# Patient Record
Sex: Female | Born: 1940 | Race: White | Hispanic: No | Marital: Married | Smoking: Never smoker
Health system: Southern US, Community
[De-identification: ages and names within clinical notes are randomized; demographics above are authoritative.]

## PROBLEM LIST (undated history)

## (undated) DIAGNOSIS — I493 Ventricular premature depolarization: Secondary | ICD-10-CM

## (undated) DIAGNOSIS — K219 Gastro-esophageal reflux disease without esophagitis: Secondary | ICD-10-CM

## (undated) DIAGNOSIS — F5104 Psychophysiologic insomnia: Secondary | ICD-10-CM

## (undated) DIAGNOSIS — K5790 Diverticulosis of intestine, part unspecified, without perforation or abscess without bleeding: Secondary | ICD-10-CM

## (undated) DIAGNOSIS — F32A Depression, unspecified: Secondary | ICD-10-CM

## (undated) DIAGNOSIS — F329 Major depressive disorder, single episode, unspecified: Secondary | ICD-10-CM

## (undated) DIAGNOSIS — F419 Anxiety disorder, unspecified: Secondary | ICD-10-CM

## (undated) DIAGNOSIS — R002 Palpitations: Secondary | ICD-10-CM

## (undated) DIAGNOSIS — M5136 Other intervertebral disc degeneration, lumbar region: Secondary | ICD-10-CM

## (undated) DIAGNOSIS — E785 Hyperlipidemia, unspecified: Secondary | ICD-10-CM

## (undated) DIAGNOSIS — M8580 Other specified disorders of bone density and structure, unspecified site: Secondary | ICD-10-CM

## (undated) DIAGNOSIS — M1990 Unspecified osteoarthritis, unspecified site: Secondary | ICD-10-CM

## (undated) HISTORY — PX: APPENDECTOMY: SHX54

## (undated) HISTORY — DX: Ventricular premature depolarization: I49.3

## (undated) HISTORY — DX: Other specified disorders of bone density and structure, unspecified site: M85.80

## (undated) HISTORY — PX: OTHER SURGICAL HISTORY: SHX169

## (undated) HISTORY — DX: Major depressive disorder, single episode, unspecified: F32.9

## (undated) HISTORY — DX: Other intervertebral disc degeneration, lumbar region: M51.36

## (undated) HISTORY — DX: Depression, unspecified: F32.A

## (undated) HISTORY — DX: Unspecified osteoarthritis, unspecified site: M19.90

## (undated) HISTORY — DX: Diverticulosis of intestine, part unspecified, without perforation or abscess without bleeding: K57.90

## (undated) HISTORY — DX: Hyperlipidemia, unspecified: E78.5

## (undated) HISTORY — DX: Psychophysiologic insomnia: F51.04

## (undated) HISTORY — PX: TOTAL ABDOMINAL HYSTERECTOMY: SHX209

## (undated) HISTORY — DX: Palpitations: R00.2

## (undated) HISTORY — DX: Anxiety disorder, unspecified: F41.9

---

## 1997-05-30 ENCOUNTER — Encounter: Payer: Self-pay | Admitting: Family Medicine

## 1997-05-30 LAB — CONVERTED CEMR LAB
Blood Glucose, Fasting: 91 mg/dL
WBC, blood: 5.6 10*3/uL

## 1998-12-11 ENCOUNTER — Other Ambulatory Visit: Admission: RE | Admit: 1998-12-11 | Discharge: 1998-12-11 | Payer: Self-pay | Admitting: Gynecology

## 1999-03-12 ENCOUNTER — Encounter: Payer: Self-pay | Admitting: Family Medicine

## 1999-04-26 HISTORY — PX: OTHER SURGICAL HISTORY: SHX169

## 1999-12-11 ENCOUNTER — Other Ambulatory Visit: Admission: RE | Admit: 1999-12-11 | Discharge: 1999-12-11 | Payer: Self-pay | Admitting: Gynecology

## 2000-12-30 ENCOUNTER — Other Ambulatory Visit: Admission: RE | Admit: 2000-12-30 | Discharge: 2000-12-30 | Payer: Self-pay | Admitting: Gynecology

## 2001-05-13 ENCOUNTER — Encounter: Payer: Self-pay | Admitting: Family Medicine

## 2001-05-13 LAB — CONVERTED CEMR LAB
Blood Glucose, Fasting: 43 mg/dL
Hgb A1c MFr Bld: 5.2 %

## 2001-12-27 ENCOUNTER — Other Ambulatory Visit: Admission: RE | Admit: 2001-12-27 | Discharge: 2001-12-27 | Payer: Self-pay | Admitting: Gynecology

## 2002-05-24 ENCOUNTER — Encounter: Payer: Self-pay | Admitting: Family Medicine

## 2002-05-24 LAB — CONVERTED CEMR LAB
Blood Glucose, Fasting: 82 mg/dL
RBC count: 4.66 10*6/uL
TSH: 2.676 microintl units/mL

## 2002-08-22 HISTORY — PX: OTHER SURGICAL HISTORY: SHX169

## 2003-05-02 ENCOUNTER — Other Ambulatory Visit: Admission: RE | Admit: 2003-05-02 | Discharge: 2003-05-02 | Payer: Self-pay | Admitting: Gynecology

## 2003-05-02 ENCOUNTER — Encounter: Payer: Self-pay | Admitting: Critical Care Medicine

## 2003-05-02 ENCOUNTER — Encounter: Admission: RE | Admit: 2003-05-02 | Discharge: 2003-05-02 | Payer: Self-pay | Admitting: Critical Care Medicine

## 2004-05-08 HISTORY — PX: OTHER SURGICAL HISTORY: SHX169

## 2004-08-29 ENCOUNTER — Ambulatory Visit: Payer: Self-pay | Admitting: Family Medicine

## 2005-01-31 ENCOUNTER — Ambulatory Visit: Payer: Self-pay | Admitting: Family Medicine

## 2005-12-26 ENCOUNTER — Ambulatory Visit: Payer: Self-pay | Admitting: Family Medicine

## 2006-05-08 ENCOUNTER — Ambulatory Visit: Payer: Self-pay | Admitting: Internal Medicine

## 2006-05-15 ENCOUNTER — Ambulatory Visit: Payer: Self-pay | Admitting: Family Medicine

## 2006-05-15 ENCOUNTER — Ambulatory Visit: Payer: Self-pay | Admitting: Internal Medicine

## 2006-05-15 ENCOUNTER — Ambulatory Visit: Payer: Self-pay

## 2006-05-15 HISTORY — PX: OTHER SURGICAL HISTORY: SHX169

## 2006-06-11 ENCOUNTER — Ambulatory Visit: Payer: Self-pay | Admitting: Internal Medicine

## 2006-08-06 ENCOUNTER — Ambulatory Visit: Payer: Self-pay | Admitting: Internal Medicine

## 2006-12-17 ENCOUNTER — Ambulatory Visit: Payer: Self-pay | Admitting: Family Medicine

## 2007-03-15 ENCOUNTER — Ambulatory Visit: Payer: Self-pay | Admitting: Internal Medicine

## 2007-05-17 ENCOUNTER — Ambulatory Visit: Payer: Self-pay | Admitting: Family Medicine

## 2007-05-25 ENCOUNTER — Encounter: Payer: Self-pay | Admitting: Family Medicine

## 2007-05-25 LAB — CONVERTED CEMR LAB: RBC count: 4.66 10*6/uL

## 2007-05-27 ENCOUNTER — Telehealth: Payer: Self-pay | Admitting: Family Medicine

## 2007-06-17 ENCOUNTER — Telehealth (INDEPENDENT_AMBULATORY_CARE_PROVIDER_SITE_OTHER): Payer: Self-pay | Admitting: *Deleted

## 2008-01-11 ENCOUNTER — Ambulatory Visit: Payer: Self-pay | Admitting: Family Medicine

## 2008-01-11 DIAGNOSIS — M949 Disorder of cartilage, unspecified: Secondary | ICD-10-CM

## 2008-01-11 DIAGNOSIS — J209 Acute bronchitis, unspecified: Secondary | ICD-10-CM

## 2008-01-11 DIAGNOSIS — M899 Disorder of bone, unspecified: Secondary | ICD-10-CM | POA: Insufficient documentation

## 2008-01-11 DIAGNOSIS — G4700 Insomnia, unspecified: Secondary | ICD-10-CM

## 2008-01-17 ENCOUNTER — Ambulatory Visit: Payer: Self-pay | Admitting: Internal Medicine

## 2008-01-18 ENCOUNTER — Encounter: Payer: Self-pay | Admitting: Family Medicine

## 2008-01-18 DIAGNOSIS — E785 Hyperlipidemia, unspecified: Secondary | ICD-10-CM | POA: Insufficient documentation

## 2008-01-18 DIAGNOSIS — R002 Palpitations: Secondary | ICD-10-CM | POA: Insufficient documentation

## 2008-03-28 ENCOUNTER — Ambulatory Visit: Payer: Self-pay | Admitting: Family Medicine

## 2008-03-30 ENCOUNTER — Encounter (INDEPENDENT_AMBULATORY_CARE_PROVIDER_SITE_OTHER): Payer: Self-pay | Admitting: *Deleted

## 2008-06-21 ENCOUNTER — Ambulatory Visit: Payer: Self-pay | Admitting: Family Medicine

## 2008-06-27 ENCOUNTER — Telehealth: Payer: Self-pay | Admitting: Family Medicine

## 2008-07-24 ENCOUNTER — Ambulatory Visit: Payer: Self-pay | Admitting: Physician Assistant

## 2008-07-25 ENCOUNTER — Ambulatory Visit: Payer: Self-pay | Admitting: Family Medicine

## 2008-07-31 LAB — CONVERTED CEMR LAB: Vit D, 1,25-Dihydroxy: 27 — ABNORMAL LOW (ref 30–89)

## 2008-08-03 ENCOUNTER — Telehealth: Payer: Self-pay | Admitting: Family Medicine

## 2008-09-13 ENCOUNTER — Ambulatory Visit: Payer: Self-pay | Admitting: Unknown Physician Specialty

## 2008-09-19 ENCOUNTER — Ambulatory Visit: Payer: Self-pay | Admitting: Internal Medicine

## 2008-09-25 ENCOUNTER — Ambulatory Visit: Payer: Self-pay | Admitting: Unknown Physician Specialty

## 2008-09-25 HISTORY — PX: OTHER SURGICAL HISTORY: SHX169

## 2009-02-09 DIAGNOSIS — M1990 Unspecified osteoarthritis, unspecified site: Secondary | ICD-10-CM | POA: Insufficient documentation

## 2009-02-09 DIAGNOSIS — F341 Dysthymic disorder: Secondary | ICD-10-CM | POA: Insufficient documentation

## 2009-02-12 ENCOUNTER — Telehealth (INDEPENDENT_AMBULATORY_CARE_PROVIDER_SITE_OTHER): Payer: Self-pay | Admitting: *Deleted

## 2009-04-13 ENCOUNTER — Ambulatory Visit: Payer: Self-pay | Admitting: Family Medicine

## 2009-04-13 LAB — CONVERTED CEMR LAB
ALT: 13 units/L (ref 0–35)
Albumin: 3.9 g/dL (ref 3.5–5.2)
Basophils Relative: 0 % (ref 0.0–3.0)
CO2: 27 meq/L (ref 19–32)
Calcium: 9.5 mg/dL (ref 8.4–10.5)
Cholesterol: 221 mg/dL — ABNORMAL HIGH (ref 0–200)
Creatinine, Ser: 0.7 mg/dL (ref 0.4–1.2)
Direct LDL: 155 mg/dL
Eosinophils Relative: 2.4 % (ref 0.0–5.0)
Glucose, Bld: 98 mg/dL (ref 70–99)
Hemoglobin: 13.4 g/dL (ref 12.0–15.0)
Lymphs Abs: 1.8 10*3/uL (ref 0.7–4.0)
MCV: 86.5 fL (ref 78.0–100.0)
Monocytes Absolute: 0.4 10*3/uL (ref 0.1–1.0)
Neutro Abs: 3 10*3/uL (ref 1.4–7.7)
Platelets: 288 10*3/uL (ref 150.0–400.0)
RBC: 4.6 M/uL (ref 3.87–5.11)
Total CHOL/HDL Ratio: 6
Total Protein: 7.1 g/dL (ref 6.0–8.3)
Triglycerides: 137 mg/dL (ref 0.0–149.0)

## 2009-04-16 LAB — CONVERTED CEMR LAB: Vit D, 25-Hydroxy: 18 ng/mL — ABNORMAL LOW (ref 30–89)

## 2009-04-17 ENCOUNTER — Ambulatory Visit: Payer: Self-pay | Admitting: Family Medicine

## 2009-04-17 DIAGNOSIS — J45909 Unspecified asthma, uncomplicated: Secondary | ICD-10-CM | POA: Insufficient documentation

## 2009-04-17 DIAGNOSIS — I4949 Other premature depolarization: Secondary | ICD-10-CM

## 2009-05-03 ENCOUNTER — Ambulatory Visit: Payer: Self-pay | Admitting: Family Medicine

## 2009-05-03 ENCOUNTER — Encounter (INDEPENDENT_AMBULATORY_CARE_PROVIDER_SITE_OTHER): Payer: Self-pay | Admitting: *Deleted

## 2009-05-03 LAB — FECAL OCCULT BLOOD, GUAIAC: Fecal Occult Blood: NEGATIVE

## 2009-05-03 LAB — CONVERTED CEMR LAB: OCCULT 1: NEGATIVE

## 2009-05-30 ENCOUNTER — Ambulatory Visit: Payer: Self-pay

## 2009-06-21 ENCOUNTER — Ambulatory Visit: Payer: Self-pay | Admitting: Unknown Physician Specialty

## 2009-06-26 ENCOUNTER — Ambulatory Visit: Payer: Self-pay | Admitting: Unknown Physician Specialty

## 2009-06-26 HISTORY — PX: OTHER SURGICAL HISTORY: SHX169

## 2009-08-13 ENCOUNTER — Telehealth: Payer: Self-pay | Admitting: Family Medicine

## 2009-08-14 ENCOUNTER — Ambulatory Visit: Payer: Self-pay | Admitting: Family Medicine

## 2010-02-05 ENCOUNTER — Telehealth: Payer: Self-pay | Admitting: Family Medicine

## 2010-04-09 ENCOUNTER — Encounter (INDEPENDENT_AMBULATORY_CARE_PROVIDER_SITE_OTHER): Payer: Self-pay | Admitting: *Deleted

## 2010-06-27 ENCOUNTER — Ambulatory Visit: Payer: Self-pay | Admitting: Family Medicine

## 2010-06-29 LAB — CONVERTED CEMR LAB
Albumin: 4.2 g/dL (ref 3.5–5.2)
Alkaline Phosphatase: 76 units/L (ref 39–117)
BUN: 16 mg/dL (ref 6–23)
Basophils Absolute: 0 10*3/uL (ref 0.0–0.1)
Basophils Relative: 0.6 % (ref 0.0–3.0)
CO2: 26 meq/L (ref 19–32)
Calcium: 9.2 mg/dL (ref 8.4–10.5)
Cholesterol: 242 mg/dL — ABNORMAL HIGH (ref 0–200)
Creatinine, Ser: 0.6 mg/dL (ref 0.4–1.2)
Direct LDL: 158.8 mg/dL
Eosinophils Absolute: 0.1 10*3/uL (ref 0.0–0.7)
GFR calc non Af Amer: 102.05 mL/min (ref >60)
Glucose, Bld: 94 mg/dL (ref 70–99)
HDL: 45.7 mg/dL (ref >39.00)
Lymphocytes Relative: 41.1 % (ref 12.0–46.0)
MCHC: 34 g/dL (ref 30.0–36.0)
MCV: 88.9 fL (ref 78.0–100.0)
Monocytes Absolute: 0.4 10*3/uL (ref 0.1–1.0)
Neutrophils Relative %: 49.6 % (ref 43.0–77.0)
RBC: 4.62 M/uL (ref 3.87–5.11)
RDW: 13.9 % (ref 11.5–14.6)
Total Protein: 7.3 g/dL (ref 6.0–8.3)
Triglycerides: 179 mg/dL — ABNORMAL HIGH (ref 0.0–149.0)
VLDL: 35.8 mg/dL (ref 0.0–40.0)

## 2010-07-03 ENCOUNTER — Ambulatory Visit: Payer: Self-pay | Admitting: Family Medicine

## 2010-08-05 ENCOUNTER — Telehealth: Payer: Self-pay | Admitting: Family Medicine

## 2010-10-03 NOTE — Progress Notes (Signed)
Summary: Kelly [name]  Phone Note Refill Request Message from:  Fax from Pharmacy on August 05, 2010 10:07 AM  Refills Requested: Medication #1:  AMBIEN 10 MG  TABS 1 BY MOUTH AT BEDTIME AS NEEDED   Last Refilled: 07/05/2010 [facility] [phone]   Initial call taken by: Melody Comas,  August 05, 2010 10:07 AM  Follow-up for Phone Call        rx called to pharmacy.  Follow-up by: Melody Comas,  August 05, 2010 3:42 PM    Prescriptions: AMBIEN 10 MG  TABS (ZOLPIDEM TARTRATE) 1 BY MOUTH AT BEDTIME AS NEEDED  #30 x 5   Entered and Authorized by:   Shaune Leeks MD   Signed by:   Shaune Leeks MD on 08/05/2010   Method used:   Telephoned to ...       [facility]ail[facility]       [address]       [place], [place]  [address]       Ph: [phone]       Fax: [phone]   RxID:   [id number]

## 2010-10-03 NOTE — Assessment & Plan Note (Signed)
Summary: CPX,FLU SHOT/CLE   Vital Signs:  Patient profile:   70 year old female Height:      65.5 inches Temp:     97.8 degrees F oral Pulse rate:   84 / minute Pulse rhythm:   regular BP sitting:   130 / 80  (left arm) Cuff size:   large  Vitals Entered By: Sydell Axon LPN (July 03, 2010 2:53 PM) CC: Patient declined to weigh, 30 minute checkup, goes to [facility] for GYN care, has had a hysterectomy, had a Mammogram 2 years ago, C-V Risk Management   History of Present Illness: Pt here for Comp Exam. She leaves soon for [place] for 28 day cruise of the [place]. Starts in [place] with a few days there before boarding. She is still taking Ambien but 1/2 lots of times. If she doesn't take it she wakes up during the night, often multiple times. 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  Continues to have knee pain, had LTKR 1/10 and then RTKR later that year and she has pain with active weight bearing...getting up and down. She has used Voltaren cream which helps sometimes.  Palpitations are episodic and no breathing problems since Jan. Sees Dr Doug Sou.  Cardiovascular Risk History:      Positive major cardiovascular risk factors include female age 21 years old or older and hyperlipidemia.  Negative major cardiovascular risk factors include non-tobacco-user status.    Preventive Screening-Counseling & Management  Alcohol-Tobacco     Alcohol drinks/day: 0     Smoking Status: never     Passive Smoke Exposure: no  Caffeine-Diet-Exercise     Caffeine use/day: 0     Does Patient Exercise: no     Type of exercise: walks daily, rides bikes.     Exercise (avg: min/session): <30     Times/week: 6  Problems Prior to Update: 1)  Special Screening Malig Neoplasms Other Sites  (ICD-V76.49) 2)  Premature Ventricular Contractions  (ICD-427.69) 3)  Palpitations, Occasional  (ICD-785.1) 4)  Hyperlipidemia  (ICD-272.4) 5)  Asthma  (ICD-493.90) 6)   Osteoarthritis  (ICD-715.90) 7)  Anxiety Depression  (ICD-300.4) 8)  Insomnia, Chronic  (ICD-307.42) 9)  Bronchitis, Acute With Bronchospasm  (ICD-466.0) 10)  Osteoarthrosis Unspec Whether Gen/localized Hand  (ICD-715.94) 11)  Osteopenia  (ICD-733.90)  Medications Prior to Update: 1)  Ambien 10 Mg  Tabs (Zolpidem Tartrate) .Marland Kitchen.. 1 By Mouth At Bedtime As Needed 2)  Bayer Low Strength 81 Mg  Tbec (Aspirin) .Marland Kitchen.. 1 Daily By Mouth 3)  Vitamin D 28413 Unit  Caps (Ergocalciferol) .... One Tab Every Other Week For Two Months. 4)  Propranolol Hcl 10 Mg  Tabs (Propranolol Hcl) .Marland Kitchen.. 1-2 Tabs By Mouth Every 6 Hrs As Needed Palpitations 5)  Prilosec Otc 20 Mg Tbec (Omeprazole Magnesium) .Marland Kitchen.. 1 Daily By Mouth 6)  Nasonex 50 Mcg/act Susp (Mometasone Furoate) .Marland Kitchen.. 1 Spray Each Nostril Daily 7)  Pulmicort Flexhaler 180 Mcg/act Aepb (Budesonide) .Marland Kitchen.. 1 Inhalation Daily 8)  Ventolin Hfa 108 (90 Base) Mcg/act Aers (Albuterol Sulfate) .... As Needed 9)  Aleve 220 Mg Caps (Naproxen Sodium) .... As Needed For Pain  Allergies: 1)  ! Amoxicillin 2)  ! Biaxin  Past History:  Past Medical History: Last updated: 02/09/2009 1. PREMATURE VENTRICULAR CONTRACTIONS   2. PALPITATIONS, OCCASIONAL   3. HYPERLIPIDEMIA  4. ASTHMA  5. OSTEOARTHRITIS 6. ANXIETY DEPRESSION  7. INSOMNIA, CHRONIC  8. BRONCHITIS, ACUTE WITH BRONCHOSPASM  9.  OSTEOARTHROSIS UNSPEC WHETHER GEN/LOCALIZED HAND  10. SINUSITIS, ACUTE MAXILLARY   11. OSTEOPENIA    Family History: Last updated: 07/03/2010 Father dec 86 Parkinson's sequelae Mother dec 90 MI Brother dec 1 yr Mennningitis Brother dec Hit by drunk driver at [employer] Sister A 72 Osteoporosis, Arthritis. 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  Alcohol Use - no Regular Exercise - no Drug Use - no  Risk Factors: Alcohol Use: 0 (07/03/2010) Caffeine Use: 0 (07/03/2010) Exercise: no (07/03/2010)  Risk Factors: Smoking Status: never (07/03/2010) Passive Smoke Exposure: no (07/03/2010)  Past Surgical History: NSVD X 2 BTL  TAH DUE TO ENDOMETRIOSIS W/ ENDOMETRIOMA APPENDECTOMY STRESS CARDIOLITE NORMAL EF 76%:(04/26/1999) DEXA OSTEOPENIA FEM NECK, SPINE:(08/22/2002) ABD/ ULTRA SOUND WITHIN NORMAL LIMITS:907/2005) STRESS MYOVIEW -- NORMAL:(05/15/2006) L Knee Arthroscopy (Dr Raford Pitcher) (09/25/2008) R Knee Arthroscopy Lots of Arthritis (Dr Gerrit Heck) (06/26/2009)  Family History: Father dec 86 Parkinson's sequelae Mother dec 90 MI Brother dec 1 yr Mennningitis Brother dec Hit by drunk driver at [employer] Sister A 72 Osteoporosis, Arthritis. Resp Dz CV: + MOTHER AORTIC STTENOSIS ; PACER HBP: NEGATIVE DM: + THROUGHOUT PATERNAL SIDE GOUT/ARTHRITIS:  PROSTATE CANCER: BREAST CANCER: + AUNT //FIRST COUSIN DIEC FROM CANCER OVARIAN/UTERINE CANCER: NEGATIVE COLON CANCER: DEPRESSION: NEGATIVE ETOH/DRUG ABUSE: NEGATIVE OTHER: STROKE NEGATIVE  Review of Systems General:  Denies chills, fatigue, fever, sweats, weakness, and weight loss; has night sweats of estrogen depr. Eyes:  Denies blurring, discharge, and eye pain. ENT:  Denies decreased hearing, earache, and ringing in ears. CV:  Complains of palpitations; denies chest pain or discomfort, fainting, fatigue, and shortness of breath with exertion. Resp:  Denies cough, shortness of breath, and wheezing. GI:  Denies abdominal pain, bloody stools, change in bowel habits, constipation, dark tarry stools, diarrhea,  indigestion, loss of appetite, nausea, vomiting, vomiting blood, and yellowish skin color. GU:  Denies discharge, dysuria, nocturia, and urinary frequency; nocturia once. Derm:  Denies dryness, itching, and rash; sees Derm yearly.. Neuro:  Denies numbness, poor balance, tingling, and tremors.  Physical Exam  General:  Well-developed,well-nourished,in no acute distress; alert,appropriate and cooperative throughout examination, looks good, very comfortable. Head:  Normocephalic and atraumatic without obvious abnormalities. No apparent alopecia or balding. Sinuses nontender. Eyes:  Conjunctiva clear bilaterally. Ears:  External ear exam shows no significant lesions or deformities.  Otoscopic examination reveals clear canals, tympanic membranes are intact bilaterally without bulging, retraction, inflammation or discharge. Hearing is grossly normal bilaterally. Nose:  External nasal examination shows no deformity or inflammation. Nasal mucosa are pink and moist without lesions or exudates. Mouth:  Oral mucosa and oropharynx without lesions or exudates.  Teeth in good repair. Neck:  No deformities, masses, or tenderness noted. Chest Wall:  No deformities, masses, or tenderness noted. Breasts:  not done Lungs:  Normal respiratory effort, chest expands symmetrically. Lungs are clear to auscultation, no crackles or wheezes. Very  comfortable today. Heart:  Normal rate and regular rhythm. S1 and S2 normal without gallop, murmur, click, rub or other extra sounds. Abdomen:  Bowel sounds positive,abdomen soft and non-tender without masses, organomegaly or hernias noted. Rectal:  not done Genitalia:  not done Msk:  No deformity or scoliosis noted of thoracic or lumbar spine.   Pulses:  R and L carotid,radial,femoral,dorsalis pedis and posterior tibial pulses are full and equal bilaterally Extremities:  Hands with mild angulation and nodules of the fingers. Discomfort mainly distally. ROM  decreased. Neurologic:  No cranial nerve deficits noted. Station and gait are normal. Sensory, motor and coordinative functions appear intact. Skin:  Intact without suspicious lesions or rashes. Has sclerotic type lesion in the middle of her forehead which she relates changed slightly in the last few months. Cervical Nodes:  No lymphadenopathy noted Inguinal Nodes:  No significant adenopathy Psych:  Cognition and judgment appear intact. Alert and cooperative with normal attention span and concentration. No apparent delusions, illusions, hallucinations   Impression & Recommendations:  Problem # 1:  HEALTH MAINTENANCE EXAM (ICD-V70.0) Assessment Comment Only Flu shot today. Td UTD.  Problem # 2:  PREMATURE VENTRICULAR CONTRACTIONS (ICD-427.69) Assessment: Unchanged  Stable. Cont medications as is. The following medications were removed from the medication list:    Propranolol Hcl 10 Mg Tabs (Propranolol hcl) .Marland Kitchen... 1-2 tabs by mouth every 6 hrs as needed palpitations Her updated medication list for this problem includes:    Bayer Low Strength 81 Mg Tbec (Aspirin) .Marland Kitchen... 1 daily by mouth  Echocardiogram: Normal M-mode, 2-D, color Doppler echocardiogram SBE prophylaxis is not required (05/06/2002)  Labs Reviewed: Na: 137 (06/27/2010)   K+: 4.4 (06/27/2010)   CL: 101 (06/27/2010)   HCO3: 26 (06/27/2010) Mg: 2.2 (04/13/2009)   Ca: 9.2 (06/27/2010)   TSH: 2.44 (06/27/2010)   HCO3: 26 (06/27/2010)  Problem # 3:  HYPERLIPIDEMIA (ICD-272.4) LDL 158...tyoo high. Discussed diet and exercise. Is exercising as much as able with current knee situation. Diet must be carefully monitored. Labs Reviewed: SGOT: 18 (06/27/2010)   SGPT: 13 (06/27/2010)  10 Yr Risk Heart Disease: Not enough information   HDL:45.70 (06/27/2010), 39.80 (04/13/2009)  Chol:242 (06/27/2010), 221 (04/13/2009)  Trig:179.0 (06/27/2010), 137.0 (04/13/2009)  Problem # 4:  OSTEOARTHRITIS (ICD-715.90) Assessment:  Deteriorated Her nodes look more active....presumed inflammatory. Suggest trying herb we discussed. Give it one month trial. Hopefully will help hands and some of her knee pain. May also suggest Hyaluronic acid. Her updated medication list for this problem includes:    Bayer Low Strength 81 Mg Tbec (Aspirin) .Marland Kitchen... 1 daily by mouth    Aleve 220 Mg Caps (Naproxen sodium) .Marland Kitchen... As needed for pain  Problem # 5:  INSOMNIA, CHRONIC (ICD-307.42) Assessment: Unchanged Stable on Ambien but realizes she could not stop it.  Complete Medication List: 1)  Ambien 10 Mg Tabs (Zolpidem tartrate) .Marland Kitchen.. 1 by mouth at bedtime as needed 2)  Bayer Low Strength 81 Mg Tbec (Aspirin) .Marland Kitchen.. 1 daily by mouth 3)  Prilosec Otc 20 Mg Tbec (Omeprazole magnesium) .Marland Kitchen.. 1 daily by mouth 4)  Nasonex 50 Mcg/act Susp (Mometasone furoate) .Marland Kitchen.. 1 spray each nostril daily as needed 5)  Pulmicort Flexhaler 180 Mcg/act Aepb (Budesonide) .Marland Kitchen.. 1 inhalation daily as needed 6)  Ventolin Hfa 108 (90 Base) Mcg/act Aers (Albuterol sulfate) .... As needed 7)  Aleve 220 Mg Caps (Naproxen sodium) .... As needed for pain  Other Orders: Admin 1st Vaccine (04540) Flu Vaccine 61yrs + (98119)  Cardiovascular Risk Assessment/Plan:  The patient's hypertensive risk group is category B: At least one risk factor (excluding diabetes) with no target organ damage.  Today's blood pressure is 130/80.     Patient Instructions: 1)  RTC one month for discussion of arthritis or call if needs rereferral to Dr Gavin Potters. Prescriptions: AMBIEN 10 MG  TABS (ZOLPIDEM TARTRATE) 1 BY MOUTH AT BEDTIME AS NEEDED  #30 x 5   Entered and Authorized by:   Shaune Leeks MD   Signed by:   Shaune Leeks MD on 07/03/2010   Method used:   Print then Give to Patient   RxID:   [id number]    Orders Added: 1)  Admin 1st Vaccine [90471] 2)  Flu Vaccine 51yrs + [14782] 3)  Est. Patient 65& > [95621]    Current Allergies (reviewed today): !  AMOXICILLIN ! BIAXIN   Flu Vaccine Consent Questions     Do you have a history of severe allergic reactions to this vaccine? no    Any prior history of allergic reactions to egg and/or gelatin? no    Do you have a sensitivity to the preservative Thimersol? no    Do you have a past history of Guillan-Barre Syndrome? no    Do you currently have an acute febrile illness? no    Have you ever had a severe reaction to latex? no    Vaccine information given and explained to patient? yes    Are you currently pregnant? no    Lot Number:[id number]   Exp Date:03/01/2011   Site Given  Left Deltoid [email or web address]

## 2010-10-03 NOTE — Progress Notes (Signed)
Summary: refill request for ambien  Phone Note Refill Request Message from:  Fax from Pharmacy  Refills Requested: Medication #1:  AMBIEN 10 MG  TABS 1 BY MOUTH AT BEDTIME AS NEEDED   Last Refilled: 01/09/2010 [facility] [place], [phone].  Initial call taken by: Lowella Petties CMA,  February 05, 2010 10:27 AM  Follow-up for Phone Call        [facility] Follow-up by: Lowella Petties CMA,  February 05, 2010 12:58 PM    Prescriptions: AMBIEN 10 MG  TABS (ZOLPIDEM TARTRATE) 1 BY MOUTH AT BEDTIME AS NEEDED  #30 x 5   Entered and Authorized by:   Shaune Leeks MD   Signed by:   Shaune Leeks MD on 02/05/2010   Method used:   Telephoned to ...       [facility] (retail)       [address]       [place], [place]  [address]       Ph: [phone]       Fax: [phone]   RxID:   [id number]

## 2010-10-03 NOTE — Letter (Signed)
Summary: Nadara Eaton letter  [place] [facility]  [address] [place], [place] [address]   Phone: [phone]  Fax: [phone]       04/09/2010 MRN: [id number]  [facility] [address] [place], [place]  [address]  Dear Ms. [facility],  [facility] [place], and [facility] announce the retirement of Arta Silence, M.D., from full-time practice at the [facility] office effective February 28, 2010 and his plans of returning part-time.  It is important to Dr. Hetty Ely and to our practice that you understand that [facility] has seven physicians in our office for your health care needs.  We will continue to offer the same exceptional care that you have today.    Dr. Hetty Ely has spoken to many of you about his plans for retirement and returning part-time in the fall.   We will continue to work with you through the transition to schedule appointments for you in the office and meet the high standards that [facility] is committed to.   Again, it is with great pleasure that we share the news that Dr. Hetty Ely will return to [facility] in October of 2011 with a reduced schedule.    If you have any questions, or would like to request an appointment with one of our physicians, please call us at [phone] and press the option for Scheduling an appointment.  We take pleasure in providing you with excellent patient care and look forward to seeing you at your next office visit.  Our [facility] Physicians are:  Tillman Abide, M.D. Laurita Quint, M.D. Roxy Manns, M.D. Kerby Nora, M.D. Hannah Beat, M.D. Ruthe Mannan, M.D. We proudly welcomed Raechel Ache, M.D. and Eustaquio Boyden, M.D. to the practice in July/August 2011.  Sincerely,   [facility]

## 2011-01-14 NOTE — Assessment & Plan Note (Signed)
[facility] OFFICE NOTE   NAME:Kelly [name], Kelly [name]                    MRN:          [id number]  DATE:09/19/2008                            DOB:          1941/02/28    REFERRING PHYSICIAN:  Dr. Gerrit Heck  in Orthopedics.   PRIMARY CARE PHYSICIAN:  Arta Silence, MD.   REASON FOR CONSULTATION:  Preoperative risk stratification in the  setting of an abnormal EKG.   HISTORY OF PRESENT ILLNESS:  Kelly [name] is a very pleasant 70 year old  woman who is followed by Dr. Graciela Husbands for history of symptomatic PACs and  PVCs.  She denies any known history of coronary artery disease.  She did  have a stress Myoview back in 2007 which showed normal LV function and  normal perfusion with no evidence of ischemia or infarct.  She also  carries a history of asthma and arthritis.   She is having problems with left knee pain and is currently being worked  up for arthroscopic surgery with Dr. Gerrit Heck .  At her preoperative visit  with anesthesia she was told she had an abnormal EKG and was sent here  for further risk stratification.   REVIEW OF SYSTEMS:  She denies any chest pain or dyspnea.  She has not  had orthopnea or PND.  She continues to have symptomatic palpitations.  She has failed multiple antiarrhythmics.  Review of systems is notable  for her arthritis pain as well as anxiety and depression.  Otherwise all  systems are negative.   PAST MEDICAL HISTORY:  1. Symptomatic PACs and PVCs.      a.     Normal echo and also normal Myoview within the last few       years.  2. Hypertension, resolved with weight loss.  3. Asthma.  4. Osteoarthritis.  5. History of anxiety and depression.   CURRENT MEDICATIONS:  1. Vitamin D.  2. Aspirin 81.  3. Claritin.  4. Ambien 10 mg at night.  5. Prilosec.  6. Nasonex.  7. Pulmicort.  8. Lodine, which is on hold.   ALLERGIES:  She has no known drug allergies.   SOCIAL HISTORY:   She is married.  She recently retired.  She has two  children.  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  She does not smoke cigarettes or drink alcohol.   FAMILY HISTORY:  Mother died at 78 from a heart attack. Father died at  51 from Parkinson's disease.  There is no family history of premature  coronary artery disease.   PHYSICAL EXAM:  She is well-appearing, in no acute distress.  She  ambulates around the clinic without any respiratory difficulty.  Blood  pressure is 118/72, heart rate is 102.  HEENT:  Is normal.  NECK:  Is supple.  No JVD.  Carotids are 2+ bilaterally without any  bruits.  There is no lymphadenopathy or thyromegaly.  CARDIAC:  PMI is nondisplaced.  Regular rate and rhythm with 2/6  systolic ejection murmur at the left sternal border.  No rub or gallop.  LUNGS:  Clear.  ABDOMEN:  Soft, nontender, nondistended.  No hepatosplenomegaly.  No  bruits.  No masses.  Good bowel sounds.  EXTREMITIES:  Warm with no cyanosis, clubbing or edema.  No rash.  NEURO:  Alert and oriented x3.  Cranial nerves II-XII are intact.  Moves  all fours without difficulty.  Affect is pleasant.   I have two EKGs to review.  One is from September 13, 2008.  It shows  normal sinus rhythm at a rate of 76.  There is poor R-wave progression  and the computer reads this as cannot rule out previous anterior  infarct.  There are no ST-T wave abnormalities.  EKG from today shows  sinus tachycardia at a rate of 102 with occasional PACs.  There is  minimal ST scooping throughout.  This is nondiagnostic.  The R-wave  progression here is normal.   ASSESSMENT/PLAN:  In reviewing Ms. Kouns's EKG her poor R-wave  progression seems to be just due to differential lead placement.  There  does not seem to be any significant abnormality here.  She does have  minimal nonspecific ST scooping on the EKG today, but this is not  diagnostic in any way.  She has had a fairly recent  Myoview with  preserved exercise tolerance.  I do not think she warrants any further  cardiac workup prior to her surgery.  I feel she is low risk for  perioperative cardiac complications.  I explained this to her.  Please  do not hesitate to call us if there are any problems in the  postoperative period.  She will continue to follow with Dr. Graciela Husbands.     Bevelyn Buckles. Bensimhon, MD  Electronically Signed    DRB/MedQ  DD: 09/19/2008  DT: 09/19/2008  Job #: [id number]   cc:   Duke Salvia, MD, [facility]  Ruthann Cancer, MD

## 2011-01-14 NOTE — Progress Notes (Signed)
[facility] OFFICE NOTE   NAME:[name], Kelly GERBINO                    MRN:          [id number]  DATE:03/15/2007                            DOB:          01-Dec-1940    Kelly [name] is seen in followup for palpitations related to documented  PACs and PVCs.  She had been tried on a variety of 1-C anti-arrythmic  drugs without success.  I last time gave her a prescription for short-  acting Verapamil.  This also has no significant impact.   She is currently doing well, taking nothing.   EXAMINATION:  VITAL SIGNS:  Her blood pressure is 132/80, her pulse is  81.  LUNGS:  Clear.  HEART:  Sounds were regular.  EXTREMITIES:  Without edema.   IMPRESSION:  1. Symptomatic premature atrial contractions and premature ventricular      contractions.  2. Dyslipidemia.   Kelly [name] is stable.  We will see her again in 9 months' time.     Duke Salvia, MD, [facility]  Electronically Signed    SCK/MedQ  DD: 03/15/2007  DT: 03/16/2007  Job #: [id number]

## 2011-01-14 NOTE — Letter (Signed)
Jan 17, 2008    Arta Silence, MD  [address] [place], [place] [address]   RE:  SPENCER, PETERKIN  MRN:  [id number]  /  DOB:  [birth year]   Dear Nadine Counts:   I hope you are well.  Kimori Tartaglia comes in today after some hiatus  because of persistent problems with symptomatic palpitations.  She  describes skips in her heartbeat. She describes her monitor recording  rates at 120 beats per minute. Looking back at the Holter monitor from  the year 2000, she indeed had resting average rates in the high 90s to  low 100s and sometimes as high as 120, and at that time she had about  4700 PVCs in a 24-hour period.  It is her impression that her  palpitations are worse.  She thinks they are aggravated by meals as well  as stress.   Recalling that she had been on a variety beta blockers prior to my  seeing her, has failed flecainide, and we had tried her on p.r.n.  verapamil without benefit, we discussed again treatment options.   Her current medications include aspirin and vitamin D which was just  started. I presume from her report that her hemoglobin and her TSH were  normal.   PHYSICAL EXAMINATION:  VITAL SIGNS:  Her blood pressure waas148/82.  Her  pulse was 94.  LUNGS:  Clear.  CARDIAC:  Her heart sounds were regular with only an early murmur.  ABDOMEN:  Soft.  EXTREMITIES:  Were without edema.   Electrocardiogram dated today demonstrated sinus rhythm at 82 with  intervals of 0.18/0.07/0.38. The axis was 75 degrees.   IMPRESSION:  1. Symptomatic palpitations, likely PACs based on the Holter report      from years ago.  2. Known infrequent PVCs.   Mrs. Kiesling, Nadine Counts, has significantly symptomatic palpitations.  We  discussed treatment options including p.r.n. beta blockers, a 1C agent,  specifically propafenone, and/or consideration for catheter ablation.  We discussed potential proarrhythmic risks of the second and the  potential procedure risks of the latter, and  she would like to pursue  the first with its minimal side effects.   To that, we will give her prescription for Inderal 10 mg to take one to  two as needed every 6 hours.  I will plan to sit down with her in about  6 weeks' time to review this.    Sincerely,      Duke Salvia, MD, [facility]  Electronically Signed    SCK/MedQ  DD: 01/17/2008  DT: 01/17/2008  Job #: [id number]

## 2011-01-17 NOTE — Assessment & Plan Note (Signed)
[facility]                           ELECTROPHYSIOLOGY OFFICE NOTE   NAME:Kelly [name], Kelly [name]                    MRN:          [id number]  DATE:06/11/2006                            DOB:          18-Jun-1941    Kelly [name] is seen. We saw her for symptomatic PACs. We discontinued her  __________  IV because it seemed to be associated with dizziness. This is  much improved. We put her on Rythmol which her insurance company apparently  will not cover. This has been associated potentially with some muscle  cramping at night. I looked this up in the PDR, it does describe muscle  cramps but not necessarily nocturna.   Her medications otherwise include Protonix 40 b.i.d., Ambien and Claritin.   PHYSICAL EXAMINATION:  VITAL SIGNS:  Her blood pressure today was 132/77,  her pulse was 85.  LUNGS:  Clear.  HEART:  Sounds were regular.  EXTREMITIES:  Without edema.   LABORATORY DATA:  Review of her laboratories demonstrated an LDL of 142  which is apparently down from a year ago.   IMPRESSION:  1. Premature atrial contractions - symptomatic.  2. __________  therapy for #1.  3. Muscle aches question related to #2.  4. Dyslipidemia.   Because of the issues with her insurance company, I have given her a  prescription for propafenone 225 to take twice daily. There was some data  from years ago that this was a reasonable alternative to her taking it 3  times a day.   She is to let us know in 3 weeks or so how her muscle aches are doing;  hopefully this is only related to the medication.   We will see her again in 4 months' time.   I have also encouraged her to continue working on her dyslipidemia.           ______________________________  Duke Salvia, MD, [facility]  SCK/MedQ  DD:  06/11/2006  DT:  06/13/2006 Job #:  [id number]   cc:   Arta Silence, MD

## 2011-01-17 NOTE — Assessment & Plan Note (Signed)
 HEALTHCARE                         ELECTROPHYSIOLOGY OFFICE NOTE   NAME:Kelly [name], Kelly [name]                    MRN:          [id number]  DATE:08/06/2006                            DOB:          1941-02-15    [name] is seen.  She continues to have problems with symptomatic  palpitations which have been documented to be PACs and PVCs.  We have  tried her on Rythmol, which was complicated by a metallic taste and then  some myalgias and then sensation of arthritis.  She stopped the  medication.  The arthritis is better, but not relieved.   We discussed again the potential risk issues of these, and she is  comforted by the fact that these are non-life-threatening.   We discussed the role of antiarrhythmic drugs and the potential for  proarrhythmia.  She has elected not to pursue that.   We discussed the use of p.r.n. verapamil and/or Inderal, and she would  like to try that.   We will start that, and we will plan to see her again in about 6 months  and see how it is that she is doing.  She should have that appointment  already set up, and we will check that before we reschedule.     Duke Salvia, MD, [facility]  Electronically Signed    SCK/MedQ  DD: 08/06/2006  DT: 08/07/2006  Job #: [id number]

## 2011-01-17 NOTE — Letter (Signed)
May 08, 2006     Arta Silence, MD  [address] [place], [place] [address]   RE:  Kelly, [name]  MRN:  [id number]  /  DOB:  1940/10/05   Dear Nadine Counts:   I had the pleasure of seeing your patient, Kelly [name], today at her son-  in-law's request.   As you know, she is a very pleasant 70 year old woman who has a history of  about 7-8 years of palpitations that are quite disruptive.  These followed  in the wake of a severe RSV infection and then a couple of years later  pneumonia.  She initially thought this was residual effects but you were  able to identify irregularities on auscultation and referred her to Dr.  Amil Amen.   His evaluation included a Holter monitor demonstrating PACs at about 3% of  her total count, PVCs at 10 over the entire day, and there were runs of PACs  that were clearly up to 3 maybe more than that.  These as noted were  symptomatic, although the monitor also demonstrated that she had  palpitations with normal rhythm and did not feel all of her ectopy.  The  symptoms with these include a tumbling sensation followed either by a  skipping heart rate or a racing heart rate.  She can get quite lightheaded  and presyncopal.  She often needs to sit down for an hour or two and  sometimes lie down.  She describes herself as bleached.  This implies pale.  There is no associated diaphoresis.  These are very scary to her  notwithstanding the fact that Dr. Amil Amen has tried to assure her that they  are not life-threatening.  On only one occasion did she have chest  discomfort.  This radiated to her jaw a year or two ago.  Further evaluation  included an echo that was normal and a Myoview scan that was unrevealing.   With the above information, she was started on flecainide which sounds like  it was associated with some benefit in the beginning.  More recently has  been not so beneficial (see below) and the flecainide was up titrated.  It  is  her impression that there were significant symptoms of flushing that  worsened, dizziness that worsened, and she has decreased the dose back to 50  mg twice daily on her own.   In 07-Apr-2007her mother died.  She had been sick for a long  time and in  fact, over the last 7-8 years Kelly [name]'s sleep has been markedly  disrupted by the care of her mother initially daily being called and  subsequently still being frequently interrupted by the nursing home because  of concerns.  As I bring up the issue of depression around that her daughter  nods vigorously but this may in fact be contributing.  There is also a  concomitant anxiety component.  She does not have shower intolerance.  She  does not have orthostatic intolerance apart from these.   She does have a history of high blood pressure which was treated with a  thiazide diuretic for a number of years; since having lost about 30 pounds  her blood pressure has been much better controlled and she has been able to  wean herself off of medication.   Her past cardiac history is notable for rheumatic fever of which there are  no echocardiographic sequelae.  She does not snore.  She does have daytime  somnolence but her sleep pattern as alluded to before is markedly disrupted.   PAST MEDICAL HISTORY:  In addition to the above is notable for:  1. Asthma.  2. Arthritis.  3. Anxiety/depression as noted previously.   PAST SURGICAL HISTORY:  1. Hysterectomy.  2. Appendectomy.   SOCIAL HISTORY:  She is married.  She has 2 children.  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  She does not use cigarettes, alcohol, or  recreational drugs.  She exercises some.  She does not use over-the-counter  cold medicines or caffeine.   MEDICATIONS:  1. Flecainide 50 b.i.d.  2. Protonix.  3. Ambien 10.  4. Claritin.   SHE HAS NO KNOWN DRUG ALLERGIES.   PHYSICAL  EXAMINATION:  GENERAL:  She is an older Caucasian female appearing  her stated age of 70.  VITAL SIGNS:  Her blood pressure is 135/75.  Her pulse was 90.  Her weight  was 175.  HEENT:  Demonstrated no icterus or xanthoma.  The neck veins were flat.  The  carotids were brisk and full bilaterally without bruits.  BACK:  Without kyphosis, scoliosis.  LUNGS:  Clear.  HEART:  Sounds were regular without a widely split S2.  ABDOMEN:  Soft with active bowel sounds without midline pulsation or  hepatomegaly.  Femoral pulses were 2+.  Distal pulses were intact.  EXTREMITIES:  There is no clubbing, cyanosis, or edema.  NEUROLOGIC:  Grossly normal.  SKIN:  Warm and dry.   Electrocardiogram today demonstrated a sinus rhythm with normal intervals.  Review of her Holter monitor is as recorded previously.   IMPRESSION:  1. Palpation.  2. Frequent premature atrial contractions (3%) and infrequent premature      ventricular contractions as noted in 2000, contributing to number one.  3. Hypertension, now resolved with weight loss.  4. Anxiety/depression aggravated by the death of her mother.  5. Sleep disturbance related to the care of her mother, potentially      contributing to number four.   Nadine Counts, Kelly [name] has symptomatic ectopy.  Tying to understand the best  treatment options depends upon knowing whether this is primarily now  ventricular or atrial.  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  To that end, we will use a 30-day event  recorder.   Given the fact that there has been about a 7-year hiatus since her last  stress test and we are using__________ therapy, repeating this is I think  reasonable.   Also as we spoke on the phone, addressing the anxiety/depression with  medications plus or minus counseling might be of benefit for her.  I appreciate your picking up the  ball and moving forward with this with her.   Based on the above, we will,  therefore:  1. Undertake a 30-day event recorder.  2. Myoview scanning.  3. Anticipate changing the flecainide to Rythmol for which I have given      her a prescription of 225 SR.  4. She will need a stress test, following initiation of her propafenone.  5. We will plan to see her again in about 5 weeks' time.   Thank you very much for allowing [place] to participate in the care of your  patient.    Sincerely,  Duke Salvia, MD, [facility]   SCK/MedQ  DD:  05/08/2006  DT:  05/08/2006  Job #:  [id number]   CC:   Francisca December, M.D.  Veverly Fells. Vernie Ammons, M.D.

## 2011-02-03 ENCOUNTER — Other Ambulatory Visit: Payer: Self-pay | Admitting: *Deleted

## 2011-02-03 MED ORDER — ZOLPIDEM TARTRATE 10 MG PO TABS: 10.0000 mg | ORAL_TABLET | Freq: Every evening | ORAL | Status: DC | PRN

## 2011-02-20 ENCOUNTER — Encounter: Payer: Self-pay | Admitting: Cardiovascular Disease

## 2011-07-10 ENCOUNTER — Encounter: Payer: Self-pay | Admitting: Family Medicine

## 2011-07-21 ENCOUNTER — Telehealth: Payer: Self-pay | Admitting: *Deleted

## 2011-07-21 NOTE — Telephone Encounter (Signed)
Patient advised.  She says she hasn't gotten the flu shot because she has had some ongoing respiratory issues.  She has an appt with Dr. Hetty Ely this week.  She doesn't want the Tamiflu if it is not a prophylactic measure.

## 2011-07-21 NOTE — Telephone Encounter (Signed)
She should go get a flu shot unless she has a contraindication.  Please advise the pt that the flu shot is much more effective than tamiflu in terms of prevention.  Call in tamiflu 75mg  po bid x5d to start if she has a fever #10, no rf.  I would not start the rx yet.

## 2011-07-21 NOTE — Telephone Encounter (Signed)
Pt states her husband came down with the flu Thursday night and she is asking if she should have tamiflu.  She isnt having any symptoms at this time, didn't get a flu shot.  [facility].

## 2011-07-29 ENCOUNTER — Other Ambulatory Visit: Payer: Self-pay | Admitting: *Deleted

## 2011-07-29 MED ORDER — ZOLPIDEM TARTRATE 10 MG PO TABS: 10.0000 mg | ORAL_TABLET | Freq: Every evening | ORAL | Status: DC | PRN

## 2011-07-29 NOTE — Telephone Encounter (Signed)
Received faxed refill request from pharmacy. Is it okay to refill medication? 

## 2011-07-29 NOTE — Telephone Encounter (Signed)
Rx called to pharmacy as instructed. 

## 2011-08-06 ENCOUNTER — Other Ambulatory Visit: Payer: Self-pay | Admitting: Family Medicine

## 2011-08-06 DIAGNOSIS — M949 Disorder of cartilage, unspecified: Secondary | ICD-10-CM

## 2011-08-06 DIAGNOSIS — E785 Hyperlipidemia, unspecified: Secondary | ICD-10-CM

## 2011-08-06 DIAGNOSIS — I4949 Other premature depolarization: Secondary | ICD-10-CM

## 2011-08-11 ENCOUNTER — Other Ambulatory Visit (INDEPENDENT_AMBULATORY_CARE_PROVIDER_SITE_OTHER): Payer: Medicare Other

## 2011-08-11 DIAGNOSIS — E785 Hyperlipidemia, unspecified: Secondary | ICD-10-CM

## 2011-08-11 DIAGNOSIS — I4949 Other premature depolarization: Secondary | ICD-10-CM

## 2011-08-11 DIAGNOSIS — M899 Disorder of bone, unspecified: Secondary | ICD-10-CM

## 2011-08-11 LAB — RENAL FUNCTION PANEL
BUN: 12 mg/dL (ref 6–23)
CO2: 27 mEq/L (ref 19–32)
Chloride: 106 mEq/L (ref 96–112)
GFR: 98.05 mL/min (ref >60.00)
Phosphorus: 3 mg/dL (ref 2.3–4.6)
Potassium: 4.6 mEq/L (ref 3.5–5.1)
Sodium: 141 mEq/L (ref 135–145)

## 2011-08-11 LAB — CBC WITH DIFFERENTIAL/PLATELET
Basophils Absolute: 0 10*3/uL (ref 0.0–0.1)
Eosinophils Absolute: 0.1 10*3/uL (ref 0.0–0.7)
Lymphocytes Relative: 36 % (ref 12.0–46.0)
MCHC: 33.7 g/dL (ref 30.0–36.0)
Monocytes Relative: 9 % (ref 3.0–12.0)
Neutrophils Relative %: 51.9 % (ref 43.0–77.0)
Platelets: 256 10*3/uL (ref 150.0–400.0)
RDW: 14.3 % (ref 11.5–14.6)

## 2011-08-11 LAB — LIPID PANEL
Cholesterol: 233 mg/dL — ABNORMAL HIGH (ref 0–200)
Total CHOL/HDL Ratio: 5
Triglycerides: 156 mg/dL — ABNORMAL HIGH (ref 0.0–149.0)
VLDL: 31.2 mg/dL (ref 0.0–40.0)

## 2011-08-11 LAB — HEPATIC FUNCTION PANEL
ALT: 11 U/L (ref 0–35)
Bilirubin, Direct: 0.1 mg/dL (ref 0.0–0.3)
Total Bilirubin: 0.8 mg/dL (ref 0.3–1.2)

## 2011-08-11 LAB — TSH: TSH: 1.96 u[IU]/mL (ref 0.35–5.50)

## 2011-08-11 LAB — LDL CHOLESTEROL, DIRECT: Direct LDL: 149.3 mg/dL

## 2011-08-12 ENCOUNTER — Encounter: Payer: Self-pay | Admitting: Family Medicine

## 2011-08-12 LAB — VITAMIN D 25 HYDROXY (VIT D DEFICIENCY, FRACTURES): Vit D, 25-Hydroxy: 28 ng/mL — ABNORMAL LOW (ref 30–89)

## 2011-08-13 ENCOUNTER — Encounter: Payer: Self-pay | Admitting: Family Medicine

## 2011-08-13 ENCOUNTER — Ambulatory Visit (INDEPENDENT_AMBULATORY_CARE_PROVIDER_SITE_OTHER): Payer: Medicare Other | Admitting: Family Medicine

## 2011-08-13 VITALS — BP 130/76 | HR 76 | Temp 98.5°F | Ht 65.5 in

## 2011-08-13 DIAGNOSIS — G4700 Insomnia, unspecified: Secondary | ICD-10-CM

## 2011-08-13 DIAGNOSIS — Z1231 Encounter for screening mammogram for malignant neoplasm of breast: Secondary | ICD-10-CM

## 2011-08-13 DIAGNOSIS — M949 Disorder of cartilage, unspecified: Secondary | ICD-10-CM

## 2011-08-13 DIAGNOSIS — J45909 Unspecified asthma, uncomplicated: Secondary | ICD-10-CM

## 2011-08-13 DIAGNOSIS — M899 Disorder of bone, unspecified: Secondary | ICD-10-CM

## 2011-08-13 DIAGNOSIS — M1990 Unspecified osteoarthritis, unspecified site: Secondary | ICD-10-CM

## 2011-08-13 DIAGNOSIS — Z23 Encounter for immunization: Secondary | ICD-10-CM

## 2011-08-13 DIAGNOSIS — F341 Dysthymic disorder: Secondary | ICD-10-CM

## 2011-08-13 DIAGNOSIS — Z0000 Encounter for general adult medical examination without abnormal findings: Secondary | ICD-10-CM

## 2011-08-13 DIAGNOSIS — I4949 Other premature depolarization: Secondary | ICD-10-CM

## 2011-08-13 DIAGNOSIS — E785 Hyperlipidemia, unspecified: Secondary | ICD-10-CM

## 2011-08-13 NOTE — Assessment & Plan Note (Signed)
Discussed taking supplements at least for the knee pain. Suggest Glucosamine and Chondroitin, Grape seed extract, Fish Oil and Astaxanthin. Will take months to make a difference.

## 2011-08-13 NOTE — Assessment & Plan Note (Signed)
Stable. Cont curr lifestyle.

## 2011-08-13 NOTE — Assessment & Plan Note (Signed)
Good nos. Continue current lifestyle. Take supplement as discussed for inflammation to help risk of CAD. Lab Results  Component Value Date   CHOL 233* 08/11/2011   HDL 50.00 08/11/2011   LDLDIRECT 149.3 08/11/2011   TRIG 156.0* 08/11/2011   CHOLHDL 5 08/11/2011

## 2011-08-13 NOTE — Progress Notes (Signed)
Subjective:    Patient ID: Kelly [name], female    DOB: [birth year], 70 y.o.   MRN: [id number]  HPI Pt here for Comp Exam. She has been seen in [facility] for Gyn care but stopped this year. She therefore needs her mammo ordered and scheduled here. She recently had her glasses changed and declined eye exam. She also routinely refuses a weight check.  She uses Ambien routinely for insomnia and has been counseled for many years to try to get off this.  She has recently been having the flu and took Tamiflu, at first prophylactically and then with fever she went to twice a day and had very shortened episode whereas her husband had protracted course. He did not get Tamiflu.  She has no complaints except for her knees. Her knees did not hurt while she was taking the Tamiflu.     Review of Systems  Constitutional: Negative for fever, chills, diaphoresis, fatigue and unexpected weight change.  HENT: Negative for hearing loss, ear pain, rhinorrhea, trouble swallowing and tinnitus.   Eyes: Negative for pain, discharge and visual disturbance.  Respiratory: Negative for cough, shortness of breath and wheezing.        Has acute episodes a couple times a week, always after the grandchildren. She sees Dr Doug Sou and is treated with 'roids and MDIs.   Cardiovascular: Negative for chest pain, palpitations and leg swelling.       No Fainting or Fatigue.  Gastrointestinal: Negative for nausea, vomiting, abdominal pain, diarrhea, constipation and blood in stool.       No Heartburn  Genitourinary: Negative for dysuria and frequency.  Musculoskeletal: Negative for myalgias and back pain. Arthralgias: Principally knees.  Skin: Negative for rash.       No Itching or Dryness.  Neurological: Negative for tremors and numbness.       No Tingling. No Balance Problems.  Hematological: Negative for adenopathy. Does not bruise/bleed easily.  Psychiatric/Behavioral: Negative for dysphoric mood and agitation.         Objective:   Physical Exam  Constitutional: She is oriented to person, place, and time. She appears well-developed and well-nourished. No distress.  HENT:  Head: Normocephalic and atraumatic.  Right Ear: External ear normal.  Left Ear: External ear normal.  Nose: Nose normal.  Mouth/Throat: Oropharynx is clear and moist.  Eyes: Conjunctivae and EOM are normal. Pupils are equal, round, and reactive to light. No scleral icterus.  Neck: Normal range of motion. Neck supple. No thyromegaly present.  Cardiovascular: Normal rate, regular rhythm, normal heart sounds and intact distal pulses.   No murmur heard. Pulmonary/Chest: Effort normal and breath sounds normal. No respiratory distress. She has no wheezes.  Abdominal: Soft. Bowel sounds are normal. She exhibits no mass. There is no tenderness.  Genitourinary:       Breast pevic not done.  Musculoskeletal: Normal range of motion.       Back Nontender in the Midline  Lymphadenopathy:    She has no cervical adenopathy.  Neurological: She is alert and oriented to person, place, and time. She exhibits normal muscle tone. Coordination normal.  Skin: Skin is warm and dry. No rash noted. She is not diaphoretic.  Psychiatric: She has a normal mood and affect. Her behavior is normal. Judgment and thought content normal.          Assessment & Plan:  HMPE  I have personally reviewed the Medicare Annual Wellness questionnaire and have noted 1. The patient's medical and  social history 2. Their use of alcohol, tobacco or illicit drugs 3. Their current medications and supplements 4. The patient's functional ability including ADL's, fall risks, home safety risks and hearing or visual             impairment. 5. Diet and physical activities 6. Evidence for depression or mood disorders No evidence of cognitive dysfunction.

## 2011-08-13 NOTE — Assessment & Plan Note (Signed)
Typically has two acute exacerbationas a year handled by steroids and inhalers, typically seen by Dr Doug Sou.

## 2011-08-13 NOTE — Assessment & Plan Note (Signed)
Well-controlled at this point 

## 2011-08-13 NOTE — Assessment & Plan Note (Signed)
Discussed Zolpidem and chronic use. Try to avoid.

## 2011-08-13 NOTE — Assessment & Plan Note (Signed)
Suggest Dexa next year when she establishes with new doctor as he can followup with her then.

## 2011-08-13 NOTE — Patient Instructions (Signed)
Refer for Mammo. RTC mid year 2013 for get acquainted with Dr Sharen Hones and discuss DEXA.

## 2011-08-15 ENCOUNTER — Other Ambulatory Visit: Payer: Self-pay | Admitting: Internal Medicine

## 2011-08-15 MED ORDER — ZOLPIDEM TARTRATE 10 MG PO TABS: 10.0000 mg | ORAL_TABLET | Freq: Every evening | ORAL | Status: DC | PRN

## 2011-08-15 NOTE — Telephone Encounter (Signed)
Will route to Dr. Kathie Rhodes as he just saw pt and did discuss this, looks like recent refill 07/29/2011.

## 2011-08-15 NOTE — Telephone Encounter (Signed)
Refill on Ambien requested.

## 2011-08-18 MED ORDER — ZOLPIDEM TARTRATE 10 MG PO TABS: 10.0000 mg | ORAL_TABLET | Freq: Every evening | ORAL | Status: DC | PRN

## 2011-08-18 NOTE — Telephone Encounter (Signed)
Pt called this AM to clarify this matter--she forgot to ask for a [years worth of medication] which we cannot give on Ambien, but would like to get the maximum [6 month] supply Rx sent to pharmacy. [facility] has the 11.27.12 Rx w/refill available to patient, explained this to patient that we can override w/new Rx for medication w/more refills if approved by MD.

## 2011-08-19 ENCOUNTER — Other Ambulatory Visit: Payer: Self-pay | Admitting: Family Medicine

## 2011-08-19 ENCOUNTER — Other Ambulatory Visit: Payer: Medicare Other

## 2011-08-19 DIAGNOSIS — Z1211 Encounter for screening for malignant neoplasm of colon: Secondary | ICD-10-CM

## 2011-08-19 DIAGNOSIS — Z0000 Encounter for general adult medical examination without abnormal findings: Secondary | ICD-10-CM

## 2011-08-19 LAB — FECAL OCCULT BLOOD, IMMUNOCHEMICAL: Fecal Occult Bld: NEGATIVE

## 2011-08-21 ENCOUNTER — Encounter: Payer: Self-pay | Admitting: *Deleted

## 2011-09-26 ENCOUNTER — Other Ambulatory Visit: Payer: Self-pay | Admitting: *Deleted

## 2011-09-27 MED ORDER — ZOLPIDEM TARTRATE 10 MG PO TABS: 10.0000 mg | ORAL_TABLET | Freq: Every evening | ORAL | Status: DC | PRN

## 2011-09-27 NOTE — Telephone Encounter (Signed)
Please call in

## 2011-09-29 NOTE — Telephone Encounter (Signed)
Medication phoned to pharmacy.  

## 2011-10-02 ENCOUNTER — Ambulatory Visit: Payer: Self-pay | Admitting: Family Medicine

## 2011-10-03 ENCOUNTER — Encounter: Payer: Self-pay | Admitting: Family Medicine

## 2011-10-06 ENCOUNTER — Encounter: Payer: Self-pay | Admitting: *Deleted

## 2011-12-04 ENCOUNTER — Other Ambulatory Visit: Payer: Self-pay | Admitting: *Deleted

## 2011-12-05 ENCOUNTER — Telehealth: Payer: Self-pay | Admitting: Family Medicine

## 2011-12-05 MED ORDER — ZOLPIDEM TARTRATE 10 MG PO TABS: 10.0000 mg | ORAL_TABLET | Freq: Every evening | ORAL | Status: DC | PRN

## 2011-12-05 NOTE — Telephone Encounter (Signed)
Medication phoned to pharmacy.  

## 2011-12-05 NOTE — Telephone Encounter (Signed)
Patient is requesting Rx refill.  She is very upset because she stated that every time she calls our office, or the pharmacy sends a refill request she feels like it never gets handled in a timely manner.  Patient advised that Dr. Sharen Hones is out of the office today but I will send the refill request to Dr. Para March who is in the office today.  Advised patient that she will not hear back regarding the refill request until after lunch.  Patient requested to speak with a supervisor and was transferred to [place].

## 2011-12-05 NOTE — Telephone Encounter (Signed)
I've asked for this to be called in.  This is the first I knew of this.  I would advise patient that we usually fill rx's within the next business day.  Had I been in with patients, this may not have happened until Monday anyway.  Then forward this to Dr. Reece Agar.

## 2011-12-05 NOTE — Telephone Encounter (Signed)
Patient requested to speak with manager regarding concerns about refill.  Patient states that the pharmacy faxed refill twice and said they have not had reply back.  I apologized for any inconvenience and explained to the patient that the CMA who reviewed said that we received one of the faxed refills yesterday and I would follow up on the faxed refills and what happened.  I also explained that the refill request was forwarded to Dr. Para March from a CMA to be refilled.    Patient stated she needs refilled (according to CMA who spoke to patient, the patient has two left) and would like refills to last her at least until her next doctor's appointment in June with Dr. Sharen Hones.  I asked her if she needed further assistance with anything other than this refill prior to her next appointment with Dr. Sharen Hones and she said no and thanked me.

## 2011-12-05 NOTE — Telephone Encounter (Signed)
Please call in

## 2011-12-09 NOTE — Telephone Encounter (Signed)
Please advise if you would like for me to assist in any other way.  Thanks, [employer]

## 2011-12-09 NOTE — Telephone Encounter (Signed)
Described the refill workflow and expected turn around with the patient when I spoke to the patient originally and explained that it appeared we had only received the one fax and had forwarded it appropriately to Dr. Para March for refill and explained that they were also seeing patients and it would be refilled timely.  Patient's main concern turned then to having enough medication until she sees Dr. Sharen Hones and she ask that he work with her on managing her medications.  Explained that I would document and forward to the providers.

## 2011-12-10 NOTE — Telephone Encounter (Signed)
i see this has been taken care of, sent in [place] #30 with 2 RF per Dr. Algis Downs

## 2012-01-31 DIAGNOSIS — M8580 Other specified disorders of bone density and structure, unspecified site: Secondary | ICD-10-CM

## 2012-01-31 HISTORY — DX: Other specified disorders of bone density and structure, unspecified site: M85.80

## 2012-01-31 HISTORY — PX: OTHER SURGICAL HISTORY: SHX169

## 2012-02-13 ENCOUNTER — Ambulatory Visit: Payer: Medicare Other | Admitting: Family Medicine

## 2012-02-13 ENCOUNTER — Other Ambulatory Visit (INDEPENDENT_AMBULATORY_CARE_PROVIDER_SITE_OTHER): Payer: Medicare Other

## 2012-02-13 DIAGNOSIS — M8580 Other specified disorders of bone density and structure, unspecified site: Secondary | ICD-10-CM

## 2012-02-13 DIAGNOSIS — E785 Hyperlipidemia, unspecified: Secondary | ICD-10-CM

## 2012-02-13 DIAGNOSIS — M949 Disorder of cartilage, unspecified: Secondary | ICD-10-CM

## 2012-02-13 DIAGNOSIS — M899 Disorder of bone, unspecified: Secondary | ICD-10-CM

## 2012-02-13 LAB — CBC WITH DIFFERENTIAL/PLATELET
Eosinophils Relative: 3.8 % (ref 0.0–5.0)
Lymphocytes Relative: 39.4 % (ref 12.0–46.0)
Monocytes Absolute: 0.4 10*3/uL (ref 0.1–1.0)
Monocytes Relative: 8.5 % (ref 3.0–12.0)
Neutrophils Relative %: 47.7 % (ref 43.0–77.0)
Platelets: 248 10*3/uL (ref 150.0–400.0)
WBC: 4.9 10*3/uL (ref 4.5–10.5)

## 2012-02-13 LAB — BASIC METABOLIC PANEL
CO2: 28 mEq/L (ref 19–32)
GFR: 91.28 mL/min (ref >60.00)
Glucose, Bld: 91 mg/dL (ref 70–99)
Potassium: 4.9 mEq/L (ref 3.5–5.1)
Sodium: 142 mEq/L (ref 135–145)

## 2012-02-13 LAB — LDL CHOLESTEROL, DIRECT: Direct LDL: 145.2 mg/dL

## 2012-02-13 LAB — HEPATIC FUNCTION PANEL
AST: 18 U/L (ref 0–37)
Albumin: 3.9 g/dL (ref 3.5–5.2)
Alkaline Phosphatase: 69 U/L (ref 39–117)

## 2012-02-13 LAB — TSH: TSH: 2.38 u[IU]/mL (ref 0.35–5.50)

## 2012-02-14 LAB — VITAMIN D 25 HYDROXY (VIT D DEFICIENCY, FRACTURES): Vit D, 25-Hydroxy: 29 ng/mL — ABNORMAL LOW (ref 30–89)

## 2012-02-17 ENCOUNTER — Ambulatory Visit (INDEPENDENT_AMBULATORY_CARE_PROVIDER_SITE_OTHER): Payer: Medicare Other | Admitting: Family Medicine

## 2012-02-17 ENCOUNTER — Telehealth: Payer: Self-pay

## 2012-02-17 ENCOUNTER — Encounter: Payer: Self-pay | Admitting: Family Medicine

## 2012-02-17 VITALS — BP 142/82 | HR 88 | Temp 98.5°F

## 2012-02-17 DIAGNOSIS — E559 Vitamin D deficiency, unspecified: Secondary | ICD-10-CM | POA: Insufficient documentation

## 2012-02-17 DIAGNOSIS — E785 Hyperlipidemia, unspecified: Secondary | ICD-10-CM

## 2012-02-17 DIAGNOSIS — R0981 Nasal congestion: Secondary | ICD-10-CM | POA: Insufficient documentation

## 2012-02-17 DIAGNOSIS — R030 Elevated blood-pressure reading, without diagnosis of hypertension: Secondary | ICD-10-CM

## 2012-02-17 DIAGNOSIS — M899 Disorder of bone, unspecified: Secondary | ICD-10-CM

## 2012-02-17 DIAGNOSIS — M949 Disorder of cartilage, unspecified: Secondary | ICD-10-CM

## 2012-02-17 DIAGNOSIS — IMO0001 Reserved for inherently not codable concepts without codable children: Secondary | ICD-10-CM

## 2012-02-17 DIAGNOSIS — J3489 Other specified disorders of nose and nasal sinuses: Secondary | ICD-10-CM

## 2012-02-17 MED ORDER — ZOLPIDEM TARTRATE 10 MG PO TABS: 10.0000 mg | ORAL_TABLET | Freq: Every evening | ORAL | Status: DC | PRN

## 2012-02-17 MED ORDER — OMEPRAZOLE 20 MG PO CPDR: 20.0000 mg | DELAYED_RELEASE_CAPSULE | Freq: Every day | ORAL | Status: DC

## 2012-02-17 MED ORDER — DOXYCYCLINE HYCLATE 100 MG PO CAPS: 100.0000 mg | ORAL_CAPSULE | Freq: Two times a day (BID) | ORAL | Status: DC

## 2012-02-17 MED ORDER — OMEPRAZOLE MAGNESIUM 20 MG PO TBEC: 20.0000 mg | DELAYED_RELEASE_TABLET | Freq: Every day | ORAL | Status: DC

## 2012-02-17 MED ORDER — DOXYCYCLINE HYCLATE 100 MG PO CAPS: 100.0000 mg | ORAL_CAPSULE | Freq: Two times a day (BID) | ORAL | Status: AC

## 2012-02-17 NOTE — Progress Notes (Signed)
  Subjective:    Patient ID: Kelly [name], female    DOB: [birth year], 71 y.o.   MRN: [id number]  HPI CC: 6 mo f/u, establish with new pcp.  Elevated BP readings in past, prior on HCTZ but stopped years ago.  No HA, vision changes, CP/tightness, SOB, leg swelling.   Sinus congestion - [place] beach.  Takes regular loratadine, nasonex, and mucinex DM.  Some eyebrow and sinus pain.  In morning gets some productive mucous worse in am.  Does use nasal saline as well.  No fevers/chills, cough, abd pain, n/v.  H/o palpitations, none recent.    Bad knees - takes tylenol or aleve.  Trying to postpone knee replacement.  On ambien longterm, unable to sleep if doesn't take.  Bone density scan - thinks had 3-4 yrs ago.  H/o osteopenia.  Due for rpt.  Not on calcium or vit D.  Does not drink milk or calcium fortified orange juice.  Past Medical History  Diagnosis Date  . Premature ventricular contractions   . Palpitations     occasional  . Hyperlipidemia   . Asthma   . Osteoarthritis   . Anxiety and depression   . Chronic insomnia   . Osteopenia     Review of Systems Per HPI    Objective:   Physical Exam  Nursing note and vitals reviewed. Constitutional: She appears well-developed and well-nourished. No distress.  HENT:  Head: Normocephalic and atraumatic.  Right Ear: Hearing, tympanic membrane, external ear and ear canal normal.  Left Ear: Hearing, tympanic membrane, external ear and ear canal normal.  Nose: No mucosal edema or rhinorrhea. Right sinus exhibits no maxillary sinus tenderness and no frontal sinus tenderness. Left sinus exhibits maxillary sinus tenderness and frontal sinus tenderness.  Mouth/Throat: Uvula is midline, oropharynx is clear and moist and mucous membranes are normal. No oropharyngeal exudate, posterior oropharyngeal edema, posterior oropharyngeal erythema or tonsillar abscesses.  Eyes: Conjunctivae and EOM are normal. Pupils are equal, round,  and reactive to light. No scleral icterus.  Neck: Normal range of motion. Neck supple. Carotid bruit is not present.  Cardiovascular: Normal rate, regular rhythm, normal heart sounds and intact distal pulses.   No murmur heard. Pulmonary/Chest: Effort normal and breath sounds normal. No respiratory distress. She has no wheezes. She has no rales.  Musculoskeletal: She exhibits no edema.  Lymphadenopathy:    She has no cervical adenopathy.  Skin: Skin is warm and dry. No rash noted.  Psychiatric: She has a normal mood and affect.       Assessment & Plan:

## 2012-02-17 NOTE — Assessment & Plan Note (Signed)
Remains borderline low.  Recommended start daily supplement.

## 2012-02-17 NOTE — Assessment & Plan Note (Signed)
Schedule dexa. Discussed recommended daily calcium/vit d. Pt will start supplement.

## 2012-02-17 NOTE — Assessment & Plan Note (Signed)
Elevated in past, no longer on HCTZ.   Advised monitor bp and update me if sxs not improving.

## 2012-02-17 NOTE — Assessment & Plan Note (Signed)
Elevated levels, discussed dietary changes

## 2012-02-17 NOTE — Assessment & Plan Note (Signed)
Ongoing sxs for last 2 wks, possible developing sinus infection. Recommend continue treatment as up to now, if sxs progress, rec start abx (script provided).

## 2012-02-17 NOTE — Telephone Encounter (Signed)
Sent in new dose.

## 2012-02-17 NOTE — Patient Instructions (Signed)
We will order bone density scan in . Return in 6-7 mo for medicare annual. Good to see you today, call us with questions. Start daily calcium and vitamin D - 600/400 once or twice a day. Your blood pressure is slightly elevated as well as bad cholesterol.  We will keep eye on this as well. For sinus congestion - continue what you are doing, if worsening, fill antibiotic provided today.

## 2012-02-17 NOTE — Telephone Encounter (Signed)
[facility] said if fill omeprazole OTC insurance will not pay but if fills prescription omeprazole pts co pay is 0.Please advise.

## 2012-02-19 ENCOUNTER — Encounter: Payer: Self-pay | Admitting: Family Medicine

## 2012-02-19 ENCOUNTER — Ambulatory Visit: Payer: Self-pay | Admitting: Family Medicine

## 2012-06-03 ENCOUNTER — Other Ambulatory Visit: Payer: Self-pay | Admitting: *Deleted

## 2012-06-03 MED ORDER — ZOLPIDEM TARTRATE 10 MG PO TABS: 10.0000 mg | ORAL_TABLET | Freq: Every evening | ORAL | Status: DC | PRN

## 2012-06-03 NOTE — Telephone Encounter (Signed)
Rx called in as directed.   

## 2012-06-03 NOTE — Telephone Encounter (Signed)
plz phone in. 

## 2012-06-04 ENCOUNTER — Ambulatory Visit (INDEPENDENT_AMBULATORY_CARE_PROVIDER_SITE_OTHER): Payer: Medicare Other

## 2012-06-04 DIAGNOSIS — Z23 Encounter for immunization: Secondary | ICD-10-CM

## 2012-08-03 ENCOUNTER — Other Ambulatory Visit: Payer: Self-pay | Admitting: *Deleted

## 2012-08-03 MED ORDER — ZOLPIDEM TARTRATE 10 MG PO TABS: 10.0000 mg | ORAL_TABLET | Freq: Every evening | ORAL | Status: DC | PRN

## 2012-08-03 NOTE — Telephone Encounter (Signed)
Received faxed refill request from pharmacy. Last office visit 02/17/12. Is it okay to refill medication?

## 2012-08-03 NOTE — Telephone Encounter (Signed)
plz phone in. 

## 2012-08-04 NOTE — Telephone Encounter (Signed)
rx called to pharmacy 

## 2012-08-11 ENCOUNTER — Ambulatory Visit (INDEPENDENT_AMBULATORY_CARE_PROVIDER_SITE_OTHER): Payer: Medicare Other | Admitting: Family Medicine

## 2012-08-11 ENCOUNTER — Encounter: Payer: Self-pay | Admitting: Family Medicine

## 2012-08-11 VITALS — BP 142/82 | HR 96 | Temp 98.4°F

## 2012-08-11 DIAGNOSIS — Z23 Encounter for immunization: Secondary | ICD-10-CM

## 2012-08-11 DIAGNOSIS — R030 Elevated blood-pressure reading, without diagnosis of hypertension: Secondary | ICD-10-CM

## 2012-08-11 DIAGNOSIS — G4700 Insomnia, unspecified: Secondary | ICD-10-CM

## 2012-08-11 DIAGNOSIS — M1990 Unspecified osteoarthritis, unspecified site: Secondary | ICD-10-CM

## 2012-08-11 NOTE — Assessment & Plan Note (Signed)
Discussed sleep maintenance insomnia. Chronic. May continue ambien, discussed trying to decrease dose to 5mg  .  May try melatonin as well.

## 2012-08-11 NOTE — Progress Notes (Signed)
  Subjective:    Patient ID: Priscille Kluver, female    DOB: 17-Mar-1941, 71 y.o.   MRN: [id number]  HPI CC: 6 mo f/u  Due for medicare wellness visit.  Brother in law recently with shingles.  + h/o chicken pox growing up.  Discussed this.  Travels a lot - airplane.  Gets sick every time she travels.  Wonders if needs tamiflu  insomina-  Takes ambien 10mg  nightly.  Asks about cutting back on ambien.  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  Trouble shutting mind off at night.  Discussed sleep hygiene measures.  Knee pain bilaterally - h/o arthritis.  Saw Dr. Gerrit Heck in past.  Pain controlled with tylenol/aleve.  More trouble with transitions, stairs, etc.  R>L pain.  Past Medical History  Diagnosis Date  . Premature ventricular contractions   . Palpitations     occasional  . Hyperlipidemia   . Asthma   . Osteoarthritis   . Anxiety and depression   . Chronic insomnia   . Osteopenia 01/2012    DEXA 3yrs ago     Review of Systems No HA, CP/tightness, SOB, leg swelling.    Objective:   Physical Exam NAD, WDWN CF.    Assessment & Plan:

## 2012-08-11 NOTE — Patient Instructions (Addendum)
Call your insurance about the shingles shot to see if it is covered or how much it would cost and where is cheaper (here or pharmacy).  If you want to receive here, call for nurse visit. Let's do trial of melatonin.  Try to back of ambien if able - if melatonin helps. Keep track occasionally of bp at home, if consistently >140/90 then let me know Good to see you.  Return at next visit for medicare wellness visit Pneumonia and tetanus shots today.  Insomnia Insomnia is frequent trouble falling and/or staying asleep. Insomnia can be a long term problem or a short term problem. Both are common. Insomnia can be a short term problem when the wakefulness is related to a certain stress or worry. Long term insomnia is often related to ongoing stress during waking hours and/or poor sleeping habits. Overtime, sleep deprivation itself can make the problem worse. Every little thing feels more severe because you are overtired and your ability to cope is decreased. CAUSES   Stress, anxiety, and depression.  Poor sleeping habits.  Distractions such as TV in the bedroom.  Naps close to bedtime.  Engaging in emotionally charged conversations before bed.  Technical reading before sleep.  Alcohol and other sedatives. They may make the problem worse. They can hurt normal sleep patterns and normal dream activity.  Stimulants such as caffeine for several hours prior to bedtime.  Pain syndromes and shortness of breath can cause insomnia.  Exercise late at night.  Changing time zones may cause sleeping problems (jet lag). It is sometimes helpful to have someone observe your sleeping patterns. They should look for periods of not breathing during the night (sleep apnea). They should also look to see how long those periods last. If you live alone or observers are uncertain, you can also be observed at a sleep clinic where your sleep patterns will be professionally monitored. Sleep apnea requires a checkup and  treatment. Give your caregivers your medical history. Give your caregivers observations your family has made about your sleep.  SYMPTOMS   Not feeling rested in the morning.  Anxiety and restlessness at bedtime.  Difficulty falling and staying asleep. TREATMENT   Your caregiver may prescribe treatment for an underlying medical disorders. Your caregiver can give advice or help if you are using alcohol or other drugs for self-medication. Treatment of underlying problems will usually eliminate insomnia problems.  Medications can be prescribed for short time use. They are generally not recommended for lengthy use.  Over-the-counter sleep medicines are not recommended for lengthy use. They can be habit forming.  You can promote easier sleeping by making lifestyle changes such as:  Using relaxation techniques that help with breathing and reduce muscle tension.  Exercising earlier in the day.  Changing your diet and the time of your last meal. No night time snacks.  Establish a regular time to go to bed.  Counseling can help with stressful problems and worry.  Soothing music and white noise may be helpful if there are background noises you cannot remove.  Stop tedious detailed work at least one hour before bedtime. HOME CARE INSTRUCTIONS   Keep a diary. Inform your caregiver about your progress. This includes any medication side effects. See your caregiver regularly. Take note of:  Times when you are asleep.  Times when you are awake during the night.  The quality of your sleep.  How you feel the next day. This information will help your caregiver care for you.  Get  out of bed if you are still awake after 15 minutes. Read or do some quiet activity. Keep the lights down. Wait until you feel sleepy and go back to bed.  Keep regular sleeping and waking hours. Avoid naps.  Exercise regularly.  Avoid distractions at bedtime. Distractions include watching television or engaging  in any intense or detailed activity like attempting to balance the household checkbook.  Develop a bedtime ritual. Keep a familiar routine of bathing, brushing your teeth, climbing into bed at the same time each night, listening to soothing music. Routines increase the success of falling to sleep faster.  Use relaxation techniques. This can be using breathing and muscle tension release routines. It can also include visualizing peaceful scenes. You can also help control troubling or intruding thoughts by keeping your mind occupied with boring or repetitive thoughts like the old concept of counting sheep. You can make it more creative like imagining planting one beautiful flower after another in your backyard garden.  During your day, work to eliminate stress. When this is not possible use some of the previous suggestions to help reduce the anxiety that accompanies stressful situations. MAKE SURE YOU:   Understand these instructions.  Will watch your condition.  Will get help right away if you are not doing well or get worse. Document Released: 08/15/2000 Document Revised: 11/10/2011 Document Reviewed: 09/15/2007 [facility] Patient Information 2013 [place], [place].

## 2012-08-11 NOTE — Assessment & Plan Note (Signed)
Chronic.  On glucosamine chondroitin. Not to point of wanting ortho referral for discussion on knee replacement. Regularly takes tylenol/aleve.

## 2012-08-11 NOTE — Addendum Note (Signed)
Addended by: Josph Macho A on: 08/11/2012 11:27 AM   Modules accepted: Orders

## 2012-08-11 NOTE — Assessment & Plan Note (Signed)
Continue to monitor for now. Asked her to call us if consistently elevated.

## 2012-10-04 ENCOUNTER — Other Ambulatory Visit: Payer: Self-pay | Admitting: *Deleted

## 2012-10-04 NOTE — Telephone Encounter (Signed)
OK to refill

## 2012-10-05 MED ORDER — ZOLPIDEM TARTRATE 10 MG PO TABS: 10.0000 mg | ORAL_TABLET | Freq: Every evening | ORAL | Status: DC | PRN

## 2012-10-05 NOTE — Telephone Encounter (Signed)
plz phone in. 

## 2012-10-05 NOTE — Telephone Encounter (Signed)
Rx called in as directed.   

## 2012-11-30 ENCOUNTER — Other Ambulatory Visit: Payer: Self-pay | Admitting: Family Medicine

## 2012-11-30 NOTE — Telephone Encounter (Signed)
Refill request for Zolpidem.  Last filled 10/04/12.

## 2012-12-01 MED ORDER — ZOLPIDEM TARTRATE 10 MG PO TABS: 10.0000 mg | ORAL_TABLET | Freq: Every evening | ORAL | Status: DC | PRN

## 2012-12-01 NOTE — Telephone Encounter (Signed)
Rx called in as directed.   

## 2012-12-01 NOTE — Telephone Encounter (Signed)
plz phone in. 

## 2013-01-30 DIAGNOSIS — M5136 Other intervertebral disc degeneration, lumbar region: Secondary | ICD-10-CM

## 2013-01-30 DIAGNOSIS — K5790 Diverticulosis of intestine, part unspecified, without perforation or abscess without bleeding: Secondary | ICD-10-CM

## 2013-01-30 HISTORY — DX: Diverticulosis of intestine, part unspecified, without perforation or abscess without bleeding: K57.90

## 2013-01-30 HISTORY — DX: Other intervertebral disc degeneration, lumbar region: M51.36

## 2013-02-07 ENCOUNTER — Other Ambulatory Visit: Payer: Self-pay | Admitting: *Deleted

## 2013-02-07 MED ORDER — ZOLPIDEM TARTRATE 10 MG PO TABS: 10.0000 mg | ORAL_TABLET | Freq: Every evening | ORAL | Status: DC | PRN

## 2013-02-07 NOTE — Telephone Encounter (Signed)
Pt wanted to ck on status of refill for zolpidem; advised pt [facility] has already sent request for zolpidem; pt will ck with [facility] later tonight or in AM.

## 2013-02-07 NOTE — Telephone Encounter (Signed)
Ok to refill 

## 2013-02-07 NOTE — Telephone Encounter (Signed)
plz phone in. 

## 2013-02-08 NOTE — Telephone Encounter (Signed)
Rx called in as directed.   

## 2013-02-14 ENCOUNTER — Ambulatory Visit (INDEPENDENT_AMBULATORY_CARE_PROVIDER_SITE_OTHER): Payer: Medicare Other | Admitting: Family Medicine

## 2013-02-14 ENCOUNTER — Encounter: Payer: Self-pay | Admitting: Family Medicine

## 2013-02-14 VITALS — BP 128/78 | HR 96 | Temp 98.9°F

## 2013-02-14 DIAGNOSIS — R1031 Right lower quadrant pain: Secondary | ICD-10-CM

## 2013-02-14 DIAGNOSIS — R109 Unspecified abdominal pain: Secondary | ICD-10-CM

## 2013-02-14 LAB — POCT URINALYSIS DIPSTICK
Bilirubin, UA: NEGATIVE
Ketones, UA: NEGATIVE
Nitrite, UA: NEGATIVE
Protein, UA: NEGATIVE
pH, UA: 6

## 2013-02-14 NOTE — Patient Instructions (Addendum)
Urine checked today - doubt infection.  Looks like possible kidney stone.  Treat with increasing water for next several days, use strainer provided today. If worsening pain or not improving with time, let me know for further evaluation. Use ibuprofen as needed. Urine culture checked today to rule out infection.

## 2013-02-14 NOTE — Assessment & Plan Note (Signed)
Anticipate due to kidney stone given UA findings. Treat with OTC ibuprofen, strain urine, and increase water intake. Update [place] if sxs persist, consider CT abd/pelvis to eval for kidney stones. No evidence of infection cucrently, check UCx to verify this.

## 2013-02-14 NOTE — Progress Notes (Signed)
  Subjective:    Patient ID: Kelly [name], female    DOB: 1941/01/19, 72 y.o.   MRN: [id number]  HPI CC: lower abd pain  1wk h/o lower abd pain/ suprapubic pain.  Started on R, now has radiated to midline.  Pain sharp in nature, intermittent.   Also noticing increased urgency, and some incomplete emptying with weak stream.  At night time some possible frequency.  No hematuria, cloudy urine, nausea/vomiting, appetite change, or fevers/chills, no back pain.  H/o pyelo in past. S/p complete hysterectomy 1984. S/p endometrioma removal.  No recent UTI.  No recent abx use. H/o pyelo with first pregnancy.  Past Medical History  Diagnosis Date  . Premature ventricular contractions   . Palpitations     occasional  . Hyperlipidemia   . Asthma   . Osteoarthritis   . Anxiety and depression   . Chronic insomnia   . Osteopenia 01/2012    DEXA 85yrs ago    Past Surgical History  Procedure Laterality Date  . Nsvd      x2  . Btl    . Total abdominal hysterectomy      due to endometriosis w endmetrioma  . Appendectomy    . Stress cadrio lite  04/26/1999    normal ef 76%  . Dexa osteopenia fem neck, spine  08/22/2002  . Abd/ultra sound within normal limits  05/08/2004  . Stress myoview  05/15/2006    normal  . L knee arthroscopy (other)  09/25/2008    Dr. Raford Pitcher  . R knee arthroscopy (other)  06/26/2009    lots of arthritis Dr. Gerrit Heck  . Dexa  01/2012    osteopenia (T score femur -1.9, forearm -1.5)   Review of Systems Per HPI    Objective:   Physical Exam  Nursing note and vitals reviewed. Constitutional: She appears well-developed and well-nourished. No distress.  Abdominal: Soft. Normal appearance and bowel sounds are normal. She exhibits no distension and no mass. There is no hepatosplenomegaly. There is tenderness (mild) in the right lower quadrant. There is no rigidity, no rebound, no guarding, no CVA tenderness and negative Murphy's sign. No hernia.  Musculoskeletal:  She exhibits no edema.  Skin: Skin is warm and dry. No rash noted.  Psychiatric: She has a normal mood and affect.       Assessment & Plan:

## 2013-02-16 ENCOUNTER — Encounter: Payer: Self-pay | Admitting: Family Medicine

## 2013-02-17 ENCOUNTER — Other Ambulatory Visit: Payer: Self-pay | Admitting: Family Medicine

## 2013-02-17 LAB — URINE CULTURE

## 2013-02-17 MED ORDER — NITROFURANTOIN MONOHYD MACRO 100 MG PO CAPS: 100.0000 mg | ORAL_CAPSULE | Freq: Two times a day (BID) | ORAL | Status: DC

## 2013-02-23 ENCOUNTER — Encounter: Payer: Self-pay | Admitting: Family Medicine

## 2013-03-08 ENCOUNTER — Other Ambulatory Visit: Payer: Self-pay | Admitting: Family Medicine

## 2013-04-01 ENCOUNTER — Encounter: Payer: Self-pay | Admitting: Radiology

## 2013-04-01 ENCOUNTER — Encounter: Payer: Self-pay | Admitting: Family Medicine

## 2013-04-01 ENCOUNTER — Ambulatory Visit (INDEPENDENT_AMBULATORY_CARE_PROVIDER_SITE_OTHER): Payer: Medicare Other | Admitting: Family Medicine

## 2013-04-01 VITALS — BP 136/82 | HR 96 | Temp 98.4°F

## 2013-04-01 DIAGNOSIS — R1031 Right lower quadrant pain: Secondary | ICD-10-CM

## 2013-04-01 DIAGNOSIS — E785 Hyperlipidemia, unspecified: Secondary | ICD-10-CM

## 2013-04-01 DIAGNOSIS — Z1231 Encounter for screening mammogram for malignant neoplasm of breast: Secondary | ICD-10-CM

## 2013-04-01 DIAGNOSIS — R030 Elevated blood-pressure reading, without diagnosis of hypertension: Secondary | ICD-10-CM

## 2013-04-01 DIAGNOSIS — R109 Unspecified abdominal pain: Secondary | ICD-10-CM

## 2013-04-01 DIAGNOSIS — E559 Vitamin D deficiency, unspecified: Secondary | ICD-10-CM

## 2013-04-01 DIAGNOSIS — G4700 Insomnia, unspecified: Secondary | ICD-10-CM

## 2013-04-01 DIAGNOSIS — M949 Disorder of cartilage, unspecified: Secondary | ICD-10-CM

## 2013-04-01 DIAGNOSIS — R002 Palpitations: Secondary | ICD-10-CM

## 2013-04-01 LAB — POCT URINALYSIS DIPSTICK
Bilirubin, UA: NEGATIVE
Ketones, UA: NEGATIVE
Protein, UA: NEGATIVE
pH, UA: 6

## 2013-04-01 MED ORDER — TRAZODONE HCL 50 MG PO TABS: 25.0000 mg | ORAL_TABLET | Freq: Every evening | ORAL | Status: DC | PRN

## 2013-04-01 MED ORDER — ZOSTER VACCINE LIVE 19400 UNT/0.65ML ~~LOC~~ SOLR: 0.6500 mL | Freq: Once | SUBCUTANEOUS | Status: DC

## 2013-04-01 NOTE — Assessment & Plan Note (Signed)
Does not tolerate calcium.  Will start vit D 1000 IU daily.

## 2013-04-01 NOTE — Progress Notes (Signed)
Subjective:    Patient ID: Kelly [name], female    DOB: 02-06-1941, 72 y.o.   MRN: [id number]  HPI CC: HTN issues?  Didn't feel well yesterday morning - when awoke, felt indigestion and bloating pressure in abdomen/ribcage.  Some shallow breathing.  Yesterday evening had flushed cheeks and felt warm.  Did not sleep well 2/2 worried about how she was feeling. Brings log of blood pressures - 143/86, 146/74, 142/76, 144/80, then this morning 121/79 with HR of 115 - didn't feel ill with this elevated heart rate. 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 Graciela Husbands in the past. Increased awareness of these skips more recently. Has never had discomfort while walking. Some abd cramping with urination  Prior on HCTZ but this was stopped several years ago.  Denies headaches, vision changes, chest pain/tightness, dyspnea, leg swelling or retention. Started walking outside this week.  Lower abd discomfort has not totally improved.  Saw urology, thought MSK discomfort.  Insomnia - on ambien 10mg  nightly for the last 10 years - would like to try different medication.  Initially tried another prescription med but unsure what.  Sleep initiation insomnia.  Has tried melatonin - didn't help.    BP Readings from Last 3 Encounters:  04/01/13 136/82  02/14/13 128/78  08/11/12 142/82   Wt Readings from Last 3 Encounters:  10/05/96 179 lb (81.194 kg)  denies recent weight changes.  Does not let herself weigh on our scales.  Mother with h/o MI.  Past Medical History  Diagnosis Date  . Premature ventricular contractions   . Palpitations     occasional  . Hyperlipidemia   . Asthma   . Osteoarthritis   . Anxiety and depression   . Chronic insomnia   . Osteopenia 01/2012    DEXA 32yrs ago  . Diverticulosis 01/2013    by CT  . DDD (degenerative disc disease), lumbar 01/2013    by CT (L5 B pars defects with 1cm slip, L5/S1 DDD with prominent B foraminal narrowing with mass effect on exiting L5 nerve root)    Family History  Problem Relation Age of Onset  . Heart attack Mother   . Aortic stenosis Mother   . Heart disease Mother     MI, aortic stenosis, pacer  . Parkinsonism Father   . Osteoporosis Sister   . Arthritis Sister   . Breast cancer Maternal Aunt   . Stroke Neg Hx   . Drug abuse Neg Hx   . Depression Neg Hx   . Ovarian cancer Neg Hx   . Colon cancer Neg Hx    Review of Systems Per HPI    Objective:   Physical Exam  Nursing note and vitals reviewed. Constitutional: She appears well-developed and well-nourished. No distress.  HENT:  Head: Normocephalic and atraumatic.  Mouth/Throat: Oropharynx is clear and moist. No oropharyngeal exudate.  Eyes: Conjunctivae and EOM are normal. Pupils are equal, round, and reactive to light. No scleral icterus.  Neck: Normal range of motion. Neck supple. No thyromegaly present.  Cardiovascular: Normal rate, regular rhythm, normal heart sounds and intact distal pulses.   No murmur heard. Pulmonary/Chest: Effort normal and breath sounds normal. No respiratory distress. She has no wheezes. She has no rales.  Abdominal: Soft. Normal appearance and bowel sounds are normal. She exhibits no distension and no mass. There is tenderness (mild RLQ). There is no rigidity, no rebound, no guarding, no CVA tenderness and negative Murphy's sign.  Musculoskeletal: She exhibits no edema.  Skin:  Skin is warm and dry.  Psychiatric: She has a normal mood and affect.       Assessment & Plan:  Schedule mammogram dexa done 01/2012 - osteopenia

## 2013-04-01 NOTE — Assessment & Plan Note (Signed)
Discussed chronic insomnia.  Per pt, more sleep initiation insomnia. Discussed controlled substance agreement - pt would like to try a non-controlled med for insomnia. Trazodone trial sent to her pharmacy. Also provided with insomnia handout.

## 2013-04-01 NOTE — Patient Instructions (Addendum)
Return at your convenience for a medicare wellness visit. Start vitamin D 1000 units daily - over the counter We did get bone density scan 01/2012. Pass by [facility]'s office to schedule mammogram. I've printed out shingles shot vaccine prescription for you to take to pharmacy. Try trazodone for sleep at bedtime.  We will see if we can come off the [place]. Urine checked today. Come back next week fasting for blood work. Good to see you today - call us with questions.  Insomnia Insomnia is frequent trouble falling and/or staying asleep. Insomnia can be a long term problem or a short term problem. Both are common. Insomnia can be a short term problem when the wakefulness is related to a certain stress or worry. Long term insomnia is often related to ongoing stress during waking hours and/or poor sleeping habits. Overtime, sleep deprivation itself can make the problem worse. Every little thing feels more severe because you are overtired and your ability to cope is decreased. CAUSES   Stress, anxiety, and depression.  Poor sleeping habits.  Distractions such as TV in the bedroom.  Naps close to bedtime.  Engaging in emotionally charged conversations before bed.  Technical reading before sleep.  Alcohol and other sedatives. They may make the problem worse. They can hurt normal sleep patterns and normal dream activity.  Stimulants such as caffeine for several hours prior to bedtime.  Pain syndromes and shortness of breath can cause insomnia.  Exercise late at night.  Changing time zones may cause sleeping problems (jet lag). It is sometimes helpful to have someone observe your sleeping patterns. They should look for periods of not breathing during the night (sleep apnea). They should also look to see how long those periods last. If you live alone or observers are uncertain, you can also be observed at a sleep clinic where your sleep patterns will be professionally monitored. Sleep apnea  requires a checkup and treatment. Give your caregivers your medical history. Give your caregivers observations your family has made about your sleep.  SYMPTOMS   Not feeling rested in the morning.  Anxiety and restlessness at bedtime.  Difficulty falling and staying asleep. TREATMENT   Your caregiver may prescribe treatment for an underlying medical disorders. Your caregiver can give advice or help if you are using alcohol or other drugs for self-medication. Treatment of underlying problems will usually eliminate insomnia problems.  Medications can be prescribed for short time use. They are generally not recommended for lengthy use.  Over-the-counter sleep medicines are not recommended for lengthy use. They can be habit forming.  You can promote easier sleeping by making lifestyle changes such as:  Using relaxation techniques that help with breathing and reduce muscle tension.  Exercising earlier in the day.  Changing your diet and the time of your last meal. No night time snacks.  Establish a regular time to go to bed.  Counseling can help with stressful problems and worry.  Soothing music and white noise may be helpful if there are background noises you cannot remove.  Stop tedious detailed work at least one hour before bedtime. HOME CARE INSTRUCTIONS   Keep a diary. Inform your caregiver about your progress. This includes any medication side effects. See your caregiver regularly. Take note of:  Times when you are asleep.  Times when you are awake during the night.  The quality of your sleep.  How you feel the next day. This information will help your caregiver care for you.  Get out of  bed if you are still awake after 15 minutes. Read or do some quiet activity. Keep the lights down. Wait until you feel sleepy and go back to bed.  Keep regular sleeping and waking hours. Avoid naps.  Exercise regularly.  Avoid distractions at bedtime. Distractions include watching  television or engaging in any intense or detailed activity like attempting to balance the household checkbook.  Develop a bedtime ritual. Keep a familiar routine of bathing, brushing your teeth, climbing into bed at the same time each night, listening to soothing music. Routines increase the success of falling to sleep faster.  Use relaxation techniques. This can be using breathing and muscle tension release routines. It can also include visualizing peaceful scenes. You can also help control troubling or intruding thoughts by keeping your mind occupied with boring or repetitive thoughts like the old concept of counting sheep. You can make it more creative like imagining planting one beautiful flower after another in your backyard garden.  During your day, work to eliminate stress. When this is not possible use some of the previous suggestions to help reduce the anxiety that accompanies stressful situations. MAKE SURE YOU:   Understand these instructions.  Will watch your condition.  Will get help right away if you are not doing well or get worse. Document Released: 08/15/2000 Document Revised: 11/10/2011 Document Reviewed: 09/15/2007 [facility] Patient Information 2014 [place], [place].

## 2013-04-01 NOTE — Assessment & Plan Note (Signed)
Has taken weekly supplement in the past. Recommended start daily supplement at 1000IU daily.

## 2013-04-01 NOTE — Assessment & Plan Note (Signed)
Possibly becoming more frequent.  Known h/o PAC/PVCs.  Knows to return to Dr. Graciela Husbands if becoming more noticeable.  Prior on tambicor.

## 2013-04-01 NOTE — Assessment & Plan Note (Signed)
Actually , reviewed recent she brings log, her blood pressures have only been mildly elevated - and she was anxious when these readings were taken.   Doubt truly HTN but I did ask her to monitor at home and bring another log to her upcoming medicare wellness appointment. No changes today.  Pt agrees with plan.

## 2013-04-03 ENCOUNTER — Encounter: Payer: Self-pay | Admitting: Family Medicine

## 2013-04-05 ENCOUNTER — Encounter: Payer: Self-pay | Admitting: Radiology

## 2013-04-05 ENCOUNTER — Telehealth: Payer: Self-pay

## 2013-04-05 NOTE — Telephone Encounter (Signed)
Pt left v/m; pt seen 04/01/13 and pt was going to try Trazodone to take place of Ambien; pt said Trazodone has possible side effect of heart arrythmia; pt already has heart arrythmia; pt has PAC. Pt did not pick up Trazodone at pharmacy and request Trazodone rx cancelled. Pt does not want to take Trazodone and wants to go back to taking Ambien; pt has been taking Ambien which is effective with no known side effects . [facility]. Pt request cb when refilled.

## 2013-04-06 ENCOUNTER — Other Ambulatory Visit (INDEPENDENT_AMBULATORY_CARE_PROVIDER_SITE_OTHER): Payer: Medicare Other

## 2013-04-06 DIAGNOSIS — M949 Disorder of cartilage, unspecified: Secondary | ICD-10-CM

## 2013-04-06 DIAGNOSIS — M899 Disorder of bone, unspecified: Secondary | ICD-10-CM

## 2013-04-06 DIAGNOSIS — R030 Elevated blood-pressure reading, without diagnosis of hypertension: Secondary | ICD-10-CM

## 2013-04-06 DIAGNOSIS — E785 Hyperlipidemia, unspecified: Secondary | ICD-10-CM

## 2013-04-06 DIAGNOSIS — E559 Vitamin D deficiency, unspecified: Secondary | ICD-10-CM

## 2013-04-06 DIAGNOSIS — R109 Unspecified abdominal pain: Secondary | ICD-10-CM

## 2013-04-06 LAB — CBC WITH DIFFERENTIAL/PLATELET
Basophils Absolute: 0 10*3/uL (ref 0.0–0.1)
Basophils Relative: 0.5 % (ref 0.0–3.0)
Eosinophils Absolute: 0.2 10*3/uL (ref 0.0–0.7)
Lymphocytes Relative: 29.8 % (ref 12.0–46.0)
MCHC: 33 g/dL (ref 30.0–36.0)
Neutrophils Relative %: 58.1 % (ref 43.0–77.0)
Platelets: 277 10*3/uL (ref 150.0–400.0)
RBC: 4.46 Mil/uL (ref 3.87–5.11)
RDW: 13.1 % (ref 11.5–14.6)

## 2013-04-06 LAB — BASIC METABOLIC PANEL
Calcium: 9.5 mg/dL (ref 8.4–10.5)
Creatinine, Ser: 0.7 mg/dL (ref 0.4–1.2)
GFR: 87.98 mL/min (ref >60.00)
Glucose, Bld: 98 mg/dL (ref 70–99)
Sodium: 142 mEq/L (ref 135–145)

## 2013-04-06 LAB — LIPID PANEL
HDL: 44.3 mg/dL (ref >39.00)
VLDL: 24.8 mg/dL (ref 0.0–40.0)

## 2013-04-06 MED ORDER — ZOLPIDEM TARTRATE 10 MG PO TABS: 10.0000 mg | ORAL_TABLET | Freq: Every evening | ORAL | Status: DC | PRN

## 2013-04-06 NOTE — Telephone Encounter (Signed)
Trazodone is low cardiovascular risk related to other in this family (antidepressant sleep aides) and shouldn't cause problems with her h/o PAC, but if she doesn't want to try that is fine.   plz phone in [place].  She will need to update controlled substance agreement next visit she's here.

## 2013-04-07 LAB — VITAMIN D 25 HYDROXY (VIT D DEFICIENCY, FRACTURES): Vit D, 25-Hydroxy: 29 ng/mL — ABNORMAL LOW (ref 30–89)

## 2013-04-07 NOTE — Telephone Encounter (Signed)
Rx called in as directed and message left notifying patient. 

## 2013-04-08 ENCOUNTER — Encounter: Payer: Self-pay | Admitting: Family Medicine

## 2013-04-08 ENCOUNTER — Ambulatory Visit: Payer: Self-pay | Admitting: Family Medicine

## 2013-04-08 DIAGNOSIS — R928 Other abnormal and inconclusive findings on diagnostic imaging of breast: Secondary | ICD-10-CM

## 2013-04-12 ENCOUNTER — Ambulatory Visit: Payer: Self-pay | Admitting: Family Medicine

## 2013-04-14 ENCOUNTER — Other Ambulatory Visit: Payer: Self-pay | Admitting: Family Medicine

## 2013-04-14 ENCOUNTER — Telehealth: Payer: Self-pay | Admitting: Family Medicine

## 2013-04-14 ENCOUNTER — Encounter: Payer: Self-pay | Admitting: Family Medicine

## 2013-04-14 DIAGNOSIS — N6320 Unspecified lump in the left breast, unspecified quadrant: Secondary | ICD-10-CM

## 2013-04-14 DIAGNOSIS — R928 Other abnormal and inconclusive findings on diagnostic imaging of breast: Secondary | ICD-10-CM

## 2013-04-14 NOTE — Telephone Encounter (Signed)
Spoke with patient - have placed referral and will rout to [facility] - plz expedite referral to Dr. Jamey Ripa.

## 2013-04-14 NOTE — Telephone Encounter (Signed)
Spoke with patient and she preferred to speak with you directly about surgeon. Please call her at [phone] when you get an opportunity. She is aware that it may be lunch time or thereafter.

## 2013-04-14 NOTE — Telephone Encounter (Signed)
Called and discussed results of mammogram/US of left breast - <1cm nodule.  rec tissue sample.  rec surgical consult.  Pt would like to discuss with daughters and son in law Dr. Vernie Ammons. She will call me back next week with decision of who she would like to see.

## 2013-04-18 ENCOUNTER — Encounter: Payer: Self-pay | Admitting: Family Medicine

## 2013-04-20 ENCOUNTER — Ambulatory Visit
Admission: RE | Admit: 2013-04-20 | Discharge: 2013-04-20 | Disposition: A | Discharge status: Home / Self Care | Payer: Medicare Other | Source: Ambulatory Visit | Attending: Family Medicine | Admitting: Family Medicine

## 2013-04-20 DIAGNOSIS — R928 Other abnormal and inconclusive findings on diagnostic imaging of breast: Secondary | ICD-10-CM

## 2013-05-30 ENCOUNTER — Ambulatory Visit (INDEPENDENT_AMBULATORY_CARE_PROVIDER_SITE_OTHER)
Admission: RE | Admit: 2013-05-30 | Discharge: 2013-05-30 | Disposition: A | Discharge status: Home / Self Care | Payer: Medicare Other | Source: Ambulatory Visit | Attending: Family Medicine | Admitting: Family Medicine

## 2013-05-30 ENCOUNTER — Telehealth: Payer: Self-pay | Admitting: Family Medicine

## 2013-05-30 ENCOUNTER — Encounter: Payer: Self-pay | Admitting: Family Medicine

## 2013-05-30 ENCOUNTER — Ambulatory Visit (INDEPENDENT_AMBULATORY_CARE_PROVIDER_SITE_OTHER): Payer: Medicare Other | Admitting: Family Medicine

## 2013-05-30 VITALS — BP 132/82 | HR 64 | Temp 98.3°F | Ht 65.5 in

## 2013-05-30 DIAGNOSIS — R1032 Left lower quadrant pain: Secondary | ICD-10-CM

## 2013-05-30 DIAGNOSIS — R002 Palpitations: Secondary | ICD-10-CM

## 2013-05-30 DIAGNOSIS — Z23 Encounter for immunization: Secondary | ICD-10-CM

## 2013-05-30 DIAGNOSIS — R030 Elevated blood-pressure reading, without diagnosis of hypertension: Secondary | ICD-10-CM

## 2013-05-30 DIAGNOSIS — M899 Disorder of bone, unspecified: Secondary | ICD-10-CM

## 2013-05-30 DIAGNOSIS — C189 Malignant neoplasm of colon, unspecified: Secondary | ICD-10-CM

## 2013-05-30 DIAGNOSIS — G4700 Insomnia, unspecified: Secondary | ICD-10-CM

## 2013-05-30 DIAGNOSIS — R1031 Right lower quadrant pain: Secondary | ICD-10-CM

## 2013-05-30 DIAGNOSIS — R634 Abnormal weight loss: Secondary | ICD-10-CM

## 2013-05-30 DIAGNOSIS — Z0000 Encounter for general adult medical examination without abnormal findings: Secondary | ICD-10-CM

## 2013-05-30 LAB — POCT URINALYSIS DIPSTICK
Nitrite, UA: NEGATIVE
Spec Grav, UA: >=1.03
Urobilinogen, UA: 0.2

## 2013-05-30 LAB — CBC WITH DIFFERENTIAL/PLATELET
Basophils Absolute: 0 10*3/uL (ref 0.0–0.1)
Eosinophils Absolute: 0.1 10*3/uL (ref 0.0–0.7)
HCT: 39 % (ref 36.0–46.0)
Hemoglobin: 12.8 g/dL (ref 12.0–15.0)
Lymphocytes Relative: 17.3 % (ref 12.0–46.0)
Lymphs Abs: 1.5 10*3/uL (ref 0.7–4.0)
MCHC: 32.8 g/dL (ref 30.0–36.0)
Neutro Abs: 6.6 10*3/uL (ref 1.4–7.7)
Platelets: 388 10*3/uL (ref 150.0–400.0)
RDW: 13.2 % (ref 11.5–14.6)

## 2013-05-30 LAB — LIPASE: Lipase: 21 U/L (ref 11.0–59.0)

## 2013-05-30 LAB — COMPREHENSIVE METABOLIC PANEL
ALT: 9 U/L (ref 0–35)
AST: 18 U/L (ref 0–37)
CO2: 29 mEq/L (ref 19–32)
Calcium: 9.6 mg/dL (ref 8.4–10.5)
Chloride: 104 mEq/L (ref 96–112)
Creatinine, Ser: 0.7 mg/dL (ref 0.4–1.2)
GFR: 95.8 mL/min (ref >60.00)
Sodium: 140 mEq/L (ref 135–145)
Total Protein: 8 g/dL (ref 6.0–8.3)

## 2013-05-30 MED ORDER — IOHEXOL 300 MG/ML  SOLN
100.0000 mL | Freq: Once | INTRAMUSCULAR | Status: AC | PRN
  Administered 2013-05-30: 100 mL via INTRAVENOUS

## 2013-05-30 NOTE — Progress Notes (Signed)
Subjective:    Patient ID: Kelly [name], female    DOB: 1940/11/05, 72 y.o.   MRN: [id number]  HPI CC: medicare wellness visit  Insomnia - on ambien 10mg  nightly for the last 10 years. Initially tried another prescription med but unsure what. Sleep initiation insomnia. Has tried melatonin - didn't help. ER ambien didn't help.  Decided not to take trazodone 2/2 arrhythmia concerns (in h/o PACs).  1/2 tablet of ambien wasn't effective.  Desires to continue 10mg  dose nightly.  Sleep hygiene has been reviewed and handout provided in past.  Lower abd discomfort - since July having lower abd discomfort and soreness described as generalized soreness and cramping with urination.  Associated decreased appetite with 11 lb weight loss since July - 178 to 167 lbs at home.  Associated intermittent diarrhea and constipation.  No blood in urine but does have hemorrhoids and fissure from recent BM changes.  Denies nausea or vomiting.  No new foods.  No recent travel.  Hasn't associated specific food to GI upset.  + swelling lower abdomen.  No indigestion or acid reflux.  On omeprazole 20mg  prn (none in last few weeks) for last 5 years (by Dr. Lucie Leather allergist).  No fevers/chills.  + constipation - has used clear glycerin suppositories and sennakot which lead to BM.  Pain with BM.  Able to pass gas.  Brings log of blood pressures - 120-140s/70-80s.  Recently had eye exam. Passes hearing exam today. Denies depression, anhedonia, sadness No falls in past year.  Preventative: Mammogram - abnormal L mammo, s/p benign biopsy 04/2013.  rec rpt diag mammo/US 10/2013 (but mentioned R breast? - I will check with radiology and ask them to addend result). dexa 01/2012 - osteopenia.  Unable to tolerate vit D 1000 IU.  Does not take calcium either. Colon cancer screening - has never had.  Doesn't think would be able to tolerate prep.  Has not had iFOB - see above. Well woman - s/p hysterectomy for endometriosis.  No  recent OBGYN visit.  Declines breast exam in light of recent biopsies. Has script for shingles shot at home but has not had time to receive yet. Flu shot - today Td 08/2012 Pneumovax 08/2012 Advanced directives: postponed discussion.  Medications and allergies reviewed and updated in chart.  Past histories reviewed and updated if relevant as below. Patient Active Problem List   Diagnosis Date Noted  . RLQ abdominal pain 02/14/2013  . Elevated BP 02/17/2012  . Vitamin D deficiency 02/17/2012  . PREMATURE VENTRICULAR CONTRACTIONS 04/17/2009  . ASTHMA 04/17/2009  . ANXIETY DEPRESSION 02/09/2009  . OSTEOARTHRITIS 02/09/2009  . HYPERLIPIDEMIA 01/18/2008  . PALPITATIONS, OCCASIONAL 01/18/2008  . INSOMNIA, CHRONIC 01/11/2008  . OSTEOPENIA 01/11/2008   Past Medical History  Diagnosis Date  . Premature ventricular contractions   . Palpitations     occasional  . Hyperlipidemia   . Asthma   . Osteoarthritis   . Anxiety and depression   . Chronic insomnia   . Osteopenia 01/2012  . Diverticulosis 01/2013    by CT  . DDD (degenerative disc disease), lumbar 01/2013    by CT (L5 B pars defects with 1cm slip, L5/S1 DDD with prominent B foraminal narrowing with mass effect on exiting L5 nerve root)   Past Surgical History  Procedure Laterality Date  . Nsvd      x2  . Btl    . Total abdominal hysterectomy      due to endometriosis w endmetrioma  . Appendectomy    .  Stress cadrio lite  04/26/1999    normal ef 76%  . Dexa osteopenia fem neck, spine  08/22/2002  . Abd/ultra sound within normal limits  05/08/2004  . Stress myoview  05/15/2006    normal  . L knee arthroscopy (other)  09/25/2008    Dr. Raford Pitcher  . R knee arthroscopy (other)  06/26/2009    lots of arthritis Dr. Gerrit Heck  . Dexa  01/2012    osteopenia (T score femur -1.9, forearm -1.5)   History  Substance Use Topics  . Smoking status: Never Smoker   . Smokeless tobacco: Never Used  . Alcohol Use: No   Family History   Problem Relation Age of Onset  . Heart attack Mother   . Aortic stenosis Mother   . Heart disease Mother     MI, aortic stenosis, pacer  . Parkinsonism Father   . Osteoporosis Sister   . Arthritis Sister   . Breast cancer Maternal Aunt   . Stroke Neg Hx   . Drug abuse Neg Hx   . Depression Neg Hx   . Ovarian cancer Neg Hx   . Colon cancer Neg Hx    Allergies  Allergen Reactions  . Amoxicillin     REACTION: RASH  . Clarithromycin     REACTION: GI UPSET   Current Outpatient Prescriptions on File Prior to Visit  Medication Sig Dispense Refill  . Dextromethorphan-Guaifenesin (MUCINEX DM PO) Take by mouth as needed.        . loratadine (CLARITIN) 10 MG tablet Take 10 mg by mouth daily.        . Naproxen Sodium (ALEVE) 220 MG CAPS Take by mouth as needed. For pain       . omeprazole (PRILOSEC) 20 MG capsule TAKE ONE CAPSULE BY MOUTH ONE TIME DAILY  30 capsule  11  . zolpidem (AMBIEN) 10 MG tablet Take 1 tablet (10 mg total) by mouth at bedtime as needed.  30 tablet  1  . albuterol (VENTOLIN HFA) 108 (90 BASE) MCG/ACT inhaler Inhale 2 puffs into the lungs as needed.        . Budesonide (PULMICORT IN) Inhale into the lungs. Two puffs two times a day as needed       . cholecalciferol (VITAMIN D) 1000 UNITS tablet Take 1,000 Units by mouth daily.      . Glucosamine-Chondroit-Vit C-Mn (GLUCOSAMINE CHONDR 1500 COMPLX PO) Take 1,500 mg by mouth 2 (two) times daily.        No current facility-administered medications on file prior to visit.     Review of Systems  Constitutional: Positive for appetite change and unexpected weight change (11 lb weight loss over last few months). Negative for fever, chills, activity change and fatigue.  HENT: Negative for hearing loss and neck pain.   Eyes: Negative for visual disturbance.  Respiratory: Negative for cough, chest tightness, shortness of breath and wheezing.   Cardiovascular: Negative for chest pain, palpitations and leg swelling.   Gastrointestinal: Positive for abdominal pain, diarrhea, constipation and abdominal distention. Negative for nausea, vomiting and blood in stool.  Genitourinary: Negative for hematuria and difficulty urinating.  Musculoskeletal: Positive for myalgias (increased leg cramps). Negative for arthralgias.  Skin: Negative for rash.  Neurological: Negative for dizziness, seizures, syncope and headaches.  Hematological: Negative for adenopathy. Does not bruise/bleed easily.  Psychiatric/Behavioral: Negative for dysphoric mood. The patient is not nervous/anxious.        Objective:   Physical Exam  Nursing note and vitals reviewed.  Constitutional: She is oriented to person, place, and time. She appears well-developed and well-nourished. No distress.  HENT:  Head: Normocephalic and atraumatic.  Right Ear: Hearing, tympanic membrane, external ear and ear canal normal.  Left Ear: Hearing, tympanic membrane, external ear and ear canal normal.  Nose: Nose normal.  Mouth/Throat: Oropharynx is clear and moist. No oropharyngeal exudate.  Eyes: Conjunctivae and EOM are normal. Pupils are equal, round, and reactive to light. No scleral icterus.  Neck: Normal range of motion. Neck supple. No thyromegaly present.  Cardiovascular: Normal rate, regular rhythm, normal heart sounds and intact distal pulses.   No murmur heard. Pulses:      Radial pulses are 2+ on the right side, and 2+ on the left side.  Pulmonary/Chest: Effort normal and breath sounds normal. No respiratory distress. She has no wheezes. She has no rales.  Abdominal: Soft. Normal appearance and bowel sounds are normal. She exhibits no distension and no mass. There is no hepatosplenomegaly. There is tenderness (predominantly suprapubic, RUQ and RLQ). There is guarding. There is no rigidity, no rebound, no CVA tenderness and negative Murphy's sign. No hernia.  Musculoskeletal: Normal range of motion. She exhibits no edema.  Lymphadenopathy:    She  has no cervical adenopathy.  Neurological: She is alert and oriented to person, place, and time.  CN grossly intact, station and gait intact  Skin: Skin is warm and dry. No rash noted.  Psychiatric: She has a normal mood and affect. Her behavior is normal. Judgment and thought content normal.       Assessment & Plan:

## 2013-05-30 NOTE — Telephone Encounter (Signed)
Spoke with pt, discussed results of CT. Spoke with Dr. Russella Dar and Dr. Vernie Ammons. I have placed referral today for GI evaluation.

## 2013-05-30 NOTE — Patient Instructions (Addendum)
Flu shot today Blood work today. Urine checked today (urinalysis). Pass by Marion's office to schedule CT scan of abdomen for today or tomorrow.   Increase omeprazole to 20mg  twice daily. Depending on results of CT scan we will discuss plan.

## 2013-05-31 ENCOUNTER — Encounter: Payer: Self-pay | Admitting: Family Medicine

## 2013-05-31 ENCOUNTER — Ambulatory Visit (INDEPENDENT_AMBULATORY_CARE_PROVIDER_SITE_OTHER): Payer: Medicare Other | Admitting: Physician Assistant

## 2013-05-31 ENCOUNTER — Other Ambulatory Visit: Payer: Self-pay | Admitting: Gastroenterology

## 2013-05-31 ENCOUNTER — Other Ambulatory Visit: Payer: Medicare Other

## 2013-05-31 ENCOUNTER — Encounter: Payer: Self-pay | Admitting: Physician Assistant

## 2013-05-31 ENCOUNTER — Telehealth: Payer: Self-pay | Admitting: Gastroenterology

## 2013-05-31 VITALS — BP 138/74 | HR 107 | Ht 65.0 in | Wt 167.0 lb

## 2013-05-31 DIAGNOSIS — G893 Neoplasm related pain (acute) (chronic): Secondary | ICD-10-CM

## 2013-05-31 DIAGNOSIS — R198 Other specified symptoms and signs involving the digestive system and abdomen: Secondary | ICD-10-CM

## 2013-05-31 DIAGNOSIS — R1909 Other intra-abdominal and pelvic swelling, mass and lump: Secondary | ICD-10-CM

## 2013-05-31 DIAGNOSIS — R109 Unspecified abdominal pain: Secondary | ICD-10-CM

## 2013-05-31 DIAGNOSIS — R1031 Right lower quadrant pain: Secondary | ICD-10-CM | POA: Insufficient documentation

## 2013-05-31 DIAGNOSIS — F411 Generalized anxiety disorder: Secondary | ICD-10-CM

## 2013-05-31 DIAGNOSIS — C189 Malignant neoplasm of colon, unspecified: Secondary | ICD-10-CM

## 2013-05-31 DIAGNOSIS — C801 Malignant (primary) neoplasm, unspecified: Secondary | ICD-10-CM

## 2013-05-31 DIAGNOSIS — Z0000 Encounter for general adult medical examination without abnormal findings: Secondary | ICD-10-CM | POA: Insufficient documentation

## 2013-05-31 DIAGNOSIS — R933 Abnormal findings on diagnostic imaging of other parts of digestive tract: Secondary | ICD-10-CM

## 2013-05-31 DIAGNOSIS — C799 Secondary malignant neoplasm of unspecified site: Secondary | ICD-10-CM

## 2013-05-31 DIAGNOSIS — R194 Change in bowel habit: Secondary | ICD-10-CM

## 2013-05-31 MED ORDER — ALPRAZOLAM 0.25 MG PO TABS: ORAL_TABLET | ORAL | Status: DC

## 2013-05-31 MED ORDER — ONDANSETRON HCL 4 MG PO TABS: ORAL_TABLET | ORAL | Status: DC

## 2013-05-31 NOTE — Telephone Encounter (Signed)
Patient will be seen by Mike Gip and Dr Arlyce Dice in [facility] office today, 05/31/13.

## 2013-05-31 NOTE — Assessment & Plan Note (Signed)
I have personally reviewed the Medicare Annual Wellness questionnaire and have noted 1. The patient's medical and social history 2. Their use of alcohol, tobacco or illicit drugs 3. Their current medications and supplements 4. The patient's functional ability including ADL's, fall risks, home safety risks and hearing or visual impairment. 5. Diet and physical activity 6. Evidence for depression or mood disorders The patients weight, height, BMI have been recorded in the chart.  Hearing and vision has been addressed. I have made referrals, counseling and provided education to the patient based review of the above and I have provided the pt with a written personalized care plan for preventive services. See scanned questionairre. Advanced directives postponed.  Reviewed preventative protocols and updated unless pt declined.  Flu shot today. See above for colon cancer plan. I will call radiology to verify f/u plan for mammogram (R vs L sided f/u 6 mo).

## 2013-05-31 NOTE — Telephone Encounter (Signed)
I will ask Shirlee Limerick to expedite GI referral, try to have patient seen this week by GI to discuss sigmoidoscopy vs liver biopsy. I have also added CEA to blood in lab.

## 2013-05-31 NOTE — Telephone Encounter (Signed)
Pt is scheduled to see Mike Gip PA today at 2pm along with Dr. Arlyce Dice. Pt aware of appt date and time. Marion aware.

## 2013-05-31 NOTE — Assessment & Plan Note (Signed)
Unable to tolerate vit D.  Stop for now.

## 2013-05-31 NOTE — Assessment & Plan Note (Signed)
Log reveals overall stable blood pressures at home.

## 2013-05-31 NOTE — Patient Instructions (Addendum)
We sent prescriptions for Xanax and Zofran to [facility] [place], [employer] You have been scheduled for a colonoscopy with propofol. Please follow written instructions given to you at your visit today.  Directions provided. If you use inhalers (even only as needed), please bring them with you on the day of your procedure.

## 2013-05-31 NOTE — Addendum Note (Signed)
Addended by: Eustaquio Boyden on: 05/31/2013 06:42 AM   Modules accepted: Level of Service

## 2013-05-31 NOTE — Assessment & Plan Note (Addendum)
Generalized abd discomfort associated with unexpected 11lb weight loss over the last 2-3 months. Recent CT without contrast showing diverticuli. ?diverticular disease , but will need to rule out malignancy in setting of weight loss and no prior colon cancer screening. Check urinalysis along with CBC, CMP, lipase today.  Check CT scan with contrast abd/pelvis today.  Pt agrees with plan. For endorsed dyspepsia, recommended start omeprazole 20mg  twice daily.

## 2013-05-31 NOTE — Progress Notes (Signed)
Subjective:    Patient ID: Kelly [name], female    DOB: 04/24/1941, 71 y.o.   MRN: [id number]  HPI Kelly [name] is a pleasant 71-year-old white female new to GI today referred by Kelly [name] for evaluation of abnormal CT scan. Patient has been generally healthy, does have history of asthma,  and is status post total abdominal hysterectomy with BSO. She has not had prior colon screening. She had onset of lower abdominal discomfort about 3 months ago and says that she's been having some ongoing discomfort in her very lower abdomen it is been associated with intermittent bladder spasms. She had undergone workup in July and was found to have a UTI and also had a CT scan of the abdomen and pelvis done without IV contrast and that was negative exam. She says more recently she had developed loose stools and then that turned into constipation. She has not had any melena or hematochezia. No fever or chills. She has been eating less because larger meals seem to make her more uncomfortable and she has subsequently lost 11 pounds over the past 3 months. She denies any nausea or vomiting. Patient was seen by Kelly [name] earlier this week and had labs done on 05/30/2013 with a normal CBC hemoglobin of 12.8 and a normal Cmet. CT scan of the abdomen and pelvis with oral and IV contrast was done yesterday as well which unfortunately shows innumerable lesions in the left and right lobe of the liver the largest measuring 4.1 x 4.2 cm. She's also have a small amount of ascites in the pelvis and irregular thickening of the mid to distal sigmoid colon which may represent a mass. She also has a 1.5 x 1.4 mm rounded lesion in the mesentery of the right anterior pelvis, there are a few small anterior diaphragmatic lymph nodes to the right of midline and the omental thickening with numerous anterior mesenteric lymph nodes in the abdomen measuring up to 17 mm.    Review of Systems  Constitutional: Positive for unexpected  weight change.  HENT: Negative.   Eyes: Negative.   Respiratory: Negative.   Gastrointestinal: Positive for abdominal pain, diarrhea and constipation.  Endocrine: Negative.   Genitourinary: Positive for pelvic pain.  Musculoskeletal: Negative.   Skin: Negative.   Allergic/Immunologic: Negative.   Neurological: Negative.   Hematological: Negative.   Psychiatric/Behavioral: The patient is nervous/anxious.    Outpatient Prescriptions Prior to Visit  Medication Sig Dispense Refill  . albuterol (VENTOLIN HFA) 108 (90 BASE) MCG/ACT inhaler Inhale 2 puffs into the lungs as needed.        . Budesonide (PULMICORT IN) Inhale into the lungs. Two puffs two times a day as needed       . Dextromethorphan-Guaifenesin (MUCINEX DM PO) Take by mouth as needed.        . loratadine (CLARITIN) 10 MG tablet Take 10 mg by mouth daily.        . Naproxen Sodium (ALEVE) 220 MG CAPS Take by mouth as needed. For pain       . omeprazole (PRILOSEC) 20 MG capsule       . zolpidem (AMBIEN) 10 MG tablet Take 1 tablet (10 mg total) by mouth at bedtime as needed.  30 tablet  1   No facility-administered medications prior to visit.   Allergies  Allergen Reactions  . Amoxicillin     REACTION: RASH  . Clarithromycin     REACTION: GI UPSET   Patient Active Problem List   Diagnosis   Date Noted  . Medicare annual wellness visit, subsequent 05/31/2013  . Bilateral lower abdominal discomfort 05/31/2013  . 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 RLQ abdominal pain 02/14/2013  . Vitamin D deficiency 02/17/2012  . ASTHMA 04/17/2009  . ANXIETY DEPRESSION 02/09/2009  . OSTEOARTHRITIS 02/09/2009  . HYPERLIPIDEMIA 01/18/2008  . PALPITATIONS, OCCASIONAL 01/18/2008  . INSOMNIA, CHRONIC 01/11/2008  . OSTEOPENIA 01/11/2008   History  Substance Use Topics  . Smoking status: Never Smoker   . Smokeless tobacco: Never Used  . Alcohol Use: No    family history includes Aortic stenosis in her mother; Arthritis in  her sister; Breast cancer in her maternal aunt; Heart attack in her mother; Heart disease in her mother; Osteoporosis in her sister; Parkinsonism in her father. There is no history of Stroke, Drug abuse, Depression, Ovarian cancer, or Colon cancer.     Objective:   Physical Exam Well-developed white female in no acute distress, anxious- accompanied by her daughter and husband blood pressure 138/74 pulse 107 height 5 foot 5 weight 167. HEENT; nontraumatic normocephalic EOMI PERRLA sclera anicteric, Supple; no JVD, Cardiovascular; regular rate and rhythm with S1-S2 no murmur or gallop, Pulmonary; clear bilaterally, Abdomen; soft nondistended bowel sounds are active there is some fullness in the lower abdomen bilaterally no definite palpable mass or hepatosplenomegaly, Rectal; exam not done, Extremities; no clubbing cyanosis or edema skin warm and dry, Psych; mood and affect normal and appropriate.       Assessment & Plan:  #1 71-year-old female with 3 month history of lower abdominal and pelvic pain, change in bowel habits and associated weight loss. CT scan is consistent with a metastatic cancer, possibly colonic in etiology, another possibility would be a primary peritoneal cancer given fairly extensive omental involvement. #2 situational anxiety  Plan; Long discussion with the patient and her family today. Have scheduled for colonoscopy with Kelly [name] at [facility] on Thursday so that her workup can be expedited . Check CEA and CA 125 today Start Xanax( at patient's request for an anxiolytic) 0.25 mg twice daily as needed Start Zofran 4 mg every 6 hours when necessary nausea. Oncology referral is in progress-further plans and workup pending findings at colonoscopy  

## 2013-05-31 NOTE — Addendum Note (Signed)
Addended by: Eustaquio Boyden on: 05/31/2013 05:40 PM   Modules accepted: Level of Service

## 2013-05-31 NOTE — Assessment & Plan Note (Signed)
Reviewed options again, pt desires to continue ambien despite increase in tier to lvl 4

## 2013-05-31 NOTE — Progress Notes (Signed)
Reviewed and agree with management. Jasaun Carn D. Adela Esteban, M.D., FACG  

## 2013-06-01 ENCOUNTER — Encounter (HOSPITAL_COMMUNITY): Payer: Self-pay | Admitting: Pharmacy Technician

## 2013-06-01 ENCOUNTER — Telehealth: Payer: Self-pay

## 2013-06-01 ENCOUNTER — Telehealth: Payer: Self-pay | Admitting: *Deleted

## 2013-06-01 ENCOUNTER — Encounter (HOSPITAL_COMMUNITY): Payer: Self-pay | Admitting: *Deleted

## 2013-06-01 HISTORY — PX: BREAST SURGERY: SHX581

## 2013-06-01 NOTE — Progress Notes (Signed)
06-01-13 1100 Patient denies any cancer history or diagnosis- unaware of CT pelvis/abd report done 05-30-13 -Epic. W. Kennon Portela

## 2013-06-01 NOTE — Telephone Encounter (Signed)
Called [facility] unit and verified with them that the patient is now scheduled for 12:00 Noon, tomorrow 06-02-2013.  I called the patient and went over her instructions with the time changes.  She understood my instructions.  I apologized for the confusion.

## 2013-06-01 NOTE — Telephone Encounter (Signed)
Received call from pre-admissions from [facility]. Pt was instructed on a different time than what they have the procedure scheduled for at [facility]. Requesting pt be called to go over prep instructions and clarify with her the correct times. Spoke with Lowry Ram CMA and she will contact [facility] and contact pt to clarify times and instructions. Note sent to [facility].

## 2013-06-01 NOTE — Telephone Encounter (Signed)
Patient appointment with Dr. Truett Perna confirmed by phone.  Contact names and numbers were provided.

## 2013-06-02 ENCOUNTER — Encounter (HOSPITAL_COMMUNITY)
Admission: RE | Disposition: A | Discharge status: Home / Self Care | Payer: Self-pay | Source: Ambulatory Visit | Attending: Internal Medicine

## 2013-06-02 ENCOUNTER — Encounter (HOSPITAL_COMMUNITY): Payer: Self-pay

## 2013-06-02 ENCOUNTER — Observation Stay (HOSPITAL_COMMUNITY)
Admission: RE | Admit: 2013-06-02 | Discharge: 2013-06-03 | Disposition: A | Discharge status: Home / Self Care | Payer: Medicare Other | Source: Ambulatory Visit | Attending: Internal Medicine | Admitting: Internal Medicine

## 2013-06-02 ENCOUNTER — Encounter (HOSPITAL_COMMUNITY): Payer: Self-pay | Admitting: Anesthesiology

## 2013-06-02 ENCOUNTER — Ambulatory Visit (HOSPITAL_COMMUNITY): Payer: Medicare Other | Admitting: Anesthesiology

## 2013-06-02 ENCOUNTER — Ambulatory Visit (HOSPITAL_COMMUNITY): Payer: Medicare Other

## 2013-06-02 DIAGNOSIS — K5900 Constipation, unspecified: Secondary | ICD-10-CM | POA: Insufficient documentation

## 2013-06-02 DIAGNOSIS — Z9079 Acquired absence of other genital organ(s): Secondary | ICD-10-CM | POA: Insufficient documentation

## 2013-06-02 DIAGNOSIS — M899 Disorder of bone, unspecified: Secondary | ICD-10-CM | POA: Insufficient documentation

## 2013-06-02 DIAGNOSIS — C189 Malignant neoplasm of colon, unspecified: Secondary | ICD-10-CM

## 2013-06-02 DIAGNOSIS — Z90710 Acquired absence of both cervix and uterus: Secondary | ICD-10-CM | POA: Insufficient documentation

## 2013-06-02 DIAGNOSIS — R197 Diarrhea, unspecified: Secondary | ICD-10-CM | POA: Insufficient documentation

## 2013-06-02 DIAGNOSIS — R109 Unspecified abdominal pain: Secondary | ICD-10-CM | POA: Insufficient documentation

## 2013-06-02 DIAGNOSIS — R188 Other ascites: Secondary | ICD-10-CM | POA: Insufficient documentation

## 2013-06-02 DIAGNOSIS — R198 Other specified symptoms and signs involving the digestive system and abdomen: Secondary | ICD-10-CM

## 2013-06-02 DIAGNOSIS — C787 Secondary malignant neoplasm of liver and intrahepatic bile duct: Secondary | ICD-10-CM | POA: Insufficient documentation

## 2013-06-02 DIAGNOSIS — K560 Paralytic ileus: Secondary | ICD-10-CM

## 2013-06-02 DIAGNOSIS — J45909 Unspecified asthma, uncomplicated: Secondary | ICD-10-CM | POA: Insufficient documentation

## 2013-06-02 DIAGNOSIS — M549 Dorsalgia, unspecified: Secondary | ICD-10-CM | POA: Insufficient documentation

## 2013-06-02 DIAGNOSIS — C180 Malignant neoplasm of cecum: Principal | ICD-10-CM | POA: Insufficient documentation

## 2013-06-02 DIAGNOSIS — F411 Generalized anxiety disorder: Secondary | ICD-10-CM

## 2013-06-02 DIAGNOSIS — K6389 Other specified diseases of intestine: Secondary | ICD-10-CM | POA: Insufficient documentation

## 2013-06-02 DIAGNOSIS — C786 Secondary malignant neoplasm of retroperitoneum and peritoneum: Secondary | ICD-10-CM | POA: Insufficient documentation

## 2013-06-02 DIAGNOSIS — R599 Enlarged lymph nodes, unspecified: Secondary | ICD-10-CM | POA: Insufficient documentation

## 2013-06-02 DIAGNOSIS — K567 Ileus, unspecified: Secondary | ICD-10-CM

## 2013-06-02 DIAGNOSIS — E785 Hyperlipidemia, unspecified: Secondary | ICD-10-CM | POA: Insufficient documentation

## 2013-06-02 DIAGNOSIS — K5730 Diverticulosis of large intestine without perforation or abscess without bleeding: Secondary | ICD-10-CM | POA: Insufficient documentation

## 2013-06-02 DIAGNOSIS — G893 Neoplasm related pain (acute) (chronic): Secondary | ICD-10-CM

## 2013-06-02 DIAGNOSIS — G8918 Other acute postprocedural pain: Secondary | ICD-10-CM | POA: Insufficient documentation

## 2013-06-02 DIAGNOSIS — R194 Change in bowel habit: Secondary | ICD-10-CM

## 2013-06-02 DIAGNOSIS — C799 Secondary malignant neoplasm of unspecified site: Secondary | ICD-10-CM

## 2013-06-02 DIAGNOSIS — R933 Abnormal findings on diagnostic imaging of other parts of digestive tract: Secondary | ICD-10-CM

## 2013-06-02 DIAGNOSIS — K219 Gastro-esophageal reflux disease without esophagitis: Secondary | ICD-10-CM | POA: Insufficient documentation

## 2013-06-02 DIAGNOSIS — R1909 Other intra-abdominal and pelvic swelling, mass and lump: Secondary | ICD-10-CM

## 2013-06-02 DIAGNOSIS — R9431 Abnormal electrocardiogram [ECG] [EKG]: Secondary | ICD-10-CM | POA: Insufficient documentation

## 2013-06-02 DIAGNOSIS — Q619 Cystic kidney disease, unspecified: Secondary | ICD-10-CM | POA: Insufficient documentation

## 2013-06-02 HISTORY — PX: COLONOSCOPY: SHX5424

## 2013-06-02 HISTORY — DX: Gastro-esophageal reflux disease without esophagitis: K21.9

## 2013-06-02 SURGERY — COLONOSCOPY
Anesthesia: Monitor Anesthesia Care

## 2013-06-02 MED ORDER — ALPRAZOLAM 0.25 MG PO TABS: 0.2500 mg | ORAL_TABLET | Freq: Three times a day (TID) | ORAL | Status: DC | PRN

## 2013-06-02 MED ORDER — FENTANYL CITRATE 0.05 MG/ML IJ SOLN
INTRAMUSCULAR | Status: AC
  Filled 2013-06-02: qty 2

## 2013-06-02 MED ORDER — KETAMINE HCL 10 MG/ML IJ SOLN
INTRAMUSCULAR | Status: DC | PRN
  Administered 2013-06-02: 50 mg via INTRAVENOUS

## 2013-06-02 MED ORDER — ACETAMINOPHEN 325 MG PO TABS
650.0000 mg | ORAL_TABLET | Freq: Four times a day (QID) | ORAL | Status: DC | PRN
  Administered 2013-06-03: 650 mg via ORAL
  Filled 2013-06-02 (×2): qty 2

## 2013-06-02 MED ORDER — ZOLPIDEM TARTRATE 5 MG PO TABS
5.0000 mg | ORAL_TABLET | Freq: Every day | ORAL | Status: DC
  Administered 2013-06-02: 5 mg via ORAL
  Filled 2013-06-02 (×3): qty 1

## 2013-06-02 MED ORDER — PROPOFOL INFUSION 10 MG/ML OPTIME
INTRAVENOUS | Status: DC | PRN
  Administered 2013-06-02: 100 ug/kg/min via INTRAVENOUS

## 2013-06-02 MED ORDER — SPOT INK MARKER SYRINGE KIT
PACK | SUBMUCOSAL | Status: DC | PRN
  Administered 2013-06-02: 2 mL via SUBMUCOSAL

## 2013-06-02 MED ORDER — SODIUM CHLORIDE 0.9 % IV SOLN: 250.0000 mL | INTRAVENOUS | Status: DC | PRN

## 2013-06-02 MED ORDER — ONDANSETRON HCL 4 MG PO TABS
4.0000 mg | ORAL_TABLET | Freq: Four times a day (QID) | ORAL | Status: DC | PRN
  Filled 2013-06-02: qty 1

## 2013-06-02 MED ORDER — ACETAMINOPHEN 650 MG RE SUPP
650.0000 mg | Freq: Four times a day (QID) | RECTAL | Status: DC | PRN
  Filled 2013-06-02: qty 1

## 2013-06-02 MED ORDER — POTASSIUM CHLORIDE IN NACL 20-0.45 MEQ/L-% IV SOLN
INTRAVENOUS | Status: DC
  Administered 2013-06-02: 17:00:00 via INTRAVENOUS
  Administered 2013-06-03: 1000 mL via INTRAVENOUS
  Filled 2013-06-02 (×6): qty 1000

## 2013-06-02 MED ORDER — ONDANSETRON HCL 4 MG/2ML IJ SOLN: 4.0000 mg | Freq: Four times a day (QID) | INTRAMUSCULAR | Status: DC | PRN

## 2013-06-02 MED ORDER — PROMETHAZINE HCL 25 MG/ML IJ SOLN: 6.2500 mg | INTRAMUSCULAR | Status: DC | PRN

## 2013-06-02 MED ORDER — PANTOPRAZOLE SODIUM 40 MG PO TBEC
40.0000 mg | DELAYED_RELEASE_TABLET | Freq: Every day | ORAL | Status: DC
  Filled 2013-06-02: qty 1

## 2013-06-02 MED ORDER — IOHEXOL 300 MG/ML  SOLN
100.0000 mL | Freq: Once | INTRAMUSCULAR | Status: AC | PRN
  Administered 2013-06-02: 100 mL via INTRAVENOUS

## 2013-06-02 MED ORDER — LACTATED RINGERS IV SOLN: INTRAVENOUS | Status: DC

## 2013-06-02 MED ORDER — PROPOFOL 10 MG/ML IV BOLUS
INTRAVENOUS | Status: DC | PRN
  Administered 2013-06-02: 50 mg via INTRAVENOUS

## 2013-06-02 MED ORDER — HYOSCYAMINE SULFATE 0.125 MG SL SUBL
0.2500 mg | SUBLINGUAL_TABLET | SUBLINGUAL | Status: AC
  Administered 2013-06-02: 0.25 mg via SUBLINGUAL
  Filled 2013-06-02: qty 2

## 2013-06-02 MED ORDER — HYOSCYAMINE SULFATE 0.125 MG SL SUBL
0.1250 mg | SUBLINGUAL_TABLET | Freq: Four times a day (QID) | SUBLINGUAL | Status: DC | PRN
  Filled 2013-06-02: qty 1

## 2013-06-02 MED ORDER — MEPERIDINE HCL 25 MG/ML IJ SOLN: 6.2500 mg | INTRAMUSCULAR | Status: DC | PRN

## 2013-06-02 MED ORDER — SODIUM CHLORIDE 0.9 % IJ SOLN: 3.0000 mL | INTRAMUSCULAR | Status: DC | PRN

## 2013-06-02 MED ORDER — SPOT INK MARKER SYRINGE KIT
PACK | SUBMUCOSAL | Status: AC
  Filled 2013-06-02: qty 5

## 2013-06-02 MED ORDER — MORPHINE SULFATE 2 MG/ML IJ SOLN: 1.0000 mg | INTRAMUSCULAR | Status: DC | PRN

## 2013-06-02 MED ORDER — SODIUM CHLORIDE 0.9 % IV SOLN: INTRAVENOUS | Status: DC

## 2013-06-02 MED ORDER — LACTATED RINGERS IV SOLN
INTRAVENOUS | Status: DC
  Administered 2013-06-02: 11:00:00 via INTRAVENOUS

## 2013-06-02 MED ORDER — FENTANYL CITRATE 0.05 MG/ML IJ SOLN
12.5000 ug | Freq: Once | INTRAMUSCULAR | Status: AC
  Administered 2013-06-02: 12.5 ug via INTRAVENOUS

## 2013-06-02 MED ORDER — SODIUM CHLORIDE 0.9 % IJ SOLN: 3.0000 mL | Freq: Two times a day (BID) | INTRAMUSCULAR | Status: DC

## 2013-06-02 NOTE — Care Management Note (Signed)
   CARE MANAGEMENT NOTE 06/02/2013  Patient:  RUTHMARY, OCCHIPINTI   Account Number:  [id number]  Date Initiated:  06/02/2013  Documentation initiated by:  Khiara Shuping  Subjective/Objective Assessment:   72 yo female admitted for ABD Pain. Primary Care Physician: Eustaquio Boyden, MD     Action/Plan:   Home when stable   Anticipated DC Date:  06/03/2013   Anticipated DC Plan:  HOME/SELF CARE      DC Planning Services  CM consult      Choice offered to / List presented to:  NA   DME arranged  NA      DME agency  NA     HH arranged  NA      HH agency  NA   Status of service:  Completed, signed off Medicare Important Message given?   (If response is "NO", the following Medicare IM given date fields will be blank) Date Medicare IM given:   Date Additional Medicare IM given:    Discharge Disposition:    Per UR Regulation:  Reviewed for med. necessity/level of care/duration of stay  If discussed at Long Length of Stay Meetings, dates discussed:    Comments:  06/02/13 1624 Wilhemenia Camba,RN,MSN [phone] Chart reviewed for utilization of services. No needs identified at this time.

## 2013-06-02 NOTE — H&P (View-Only) (Signed)
Subjective:    Patient ID: Kelly [name], female    DOB: [birth year], 72 y.o.   MRN: [id number]  HPI Kelly [name] is a pleasant 72 year old white female new to GI today referred by Dr. Sharen Hones for evaluation of abnormal CT scan. Patient has been generally healthy, does have history of asthma,  and is status post total abdominal hysterectomy with BSO. She has not had prior colon screening. She had onset of lower abdominal discomfort about 3 months ago and says that she's been having some ongoing discomfort in her very lower abdomen it is been associated with intermittent bladder spasms. She had undergone workup in July and was found to have a UTI and also had a CT scan of the abdomen and pelvis done without IV contrast and that was negative exam. She says more recently she had developed loose stools and then that turned into constipation. She has not had any melena or hematochezia. No fever or chills. She has been eating less because larger meals seem to make her more uncomfortable and she has subsequently lost 11 pounds over the past 3 months. She denies any nausea or vomiting. Patient was seen by Dr. Sharen Hones earlier this week and had labs done on 05/30/2013 with a normal CBC hemoglobin of 12.8 and a normal Cmet. CT scan of the abdomen and pelvis with oral and IV contrast was done yesterday as well which unfortunately shows innumerable lesions in the left and right lobe of the liver the largest measuring 4.1 x 4.2 cm. She's also have a small amount of ascites in the pelvis and irregular thickening of the mid to distal sigmoid colon which may represent a mass. She also has a 1.5 x 1.4 mm rounded lesion in the mesentery of the right anterior pelvis, there are a few small anterior diaphragmatic lymph nodes to the right of midline and the omental thickening with numerous anterior mesenteric lymph nodes in the abdomen measuring up to 17 mm.    Review of Systems  Constitutional: Positive for unexpected  weight change.  HENT: Negative.   Eyes: Negative.   Respiratory: Negative.   Gastrointestinal: Positive for abdominal pain, diarrhea and constipation.  Endocrine: Negative.   Genitourinary: Positive for pelvic pain.  Musculoskeletal: Negative.   Skin: Negative.   Allergic/Immunologic: Negative.   Neurological: Negative.   Hematological: Negative.   Psychiatric/Behavioral: The patient is nervous/anxious.    Outpatient Prescriptions Prior to Visit  Medication Sig Dispense Refill  . albuterol (VENTOLIN HFA) 108 (90 BASE) MCG/ACT inhaler Inhale 2 puffs into the lungs as needed.        . Budesonide (PULMICORT IN) Inhale into the lungs. Two puffs two times a day as needed       . Dextromethorphan-Guaifenesin (MUCINEX DM PO) Take by mouth as needed.        . loratadine (CLARITIN) 10 MG tablet Take 10 mg by mouth daily.        . Naproxen Sodium (ALEVE) 220 MG CAPS Take by mouth as needed. For pain       . omeprazole (PRILOSEC) 20 MG capsule       . zolpidem (AMBIEN) 10 MG tablet Take 1 tablet (10 mg total) by mouth at bedtime as needed.  30 tablet  1   No facility-administered medications prior to visit.   Allergies  Allergen Reactions  . Amoxicillin     REACTION: RASH  . Clarithromycin     REACTION: GI UPSET   Patient Active Problem List   Diagnosis  Date Noted  . Medicare annual wellness visit, subsequent 05/31/2013  . Bilateral lower abdominal discomfort 05/31/2013  . 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 RLQ abdominal pain 02/14/2013  . Vitamin D deficiency 02/17/2012  . ASTHMA 04/17/2009  . ANXIETY DEPRESSION 02/09/2009  . OSTEOARTHRITIS 02/09/2009  . HYPERLIPIDEMIA 01/18/2008  . PALPITATIONS, OCCASIONAL 01/18/2008  . INSOMNIA, CHRONIC 01/11/2008  . OSTEOPENIA 01/11/2008   History  Substance Use Topics  . Smoking status: Never Smoker   . Smokeless tobacco: Never Used  . Alcohol Use: No    family history includes Aortic stenosis in her mother; Arthritis in  her sister; Breast cancer in her maternal aunt; Heart attack in her mother; Heart disease in her mother; Osteoporosis in her sister; Parkinsonism in her father. There is no history of Stroke, Drug abuse, Depression, Ovarian cancer, or Colon cancer.     Objective:   Physical Exam Well-developed white female in no acute distress, anxious- accompanied by her daughter and husband blood pressure 138/74 pulse 107 height 5 foot 5 weight 167. HEENT; nontraumatic normocephalic EOMI PERRLA sclera anicteric, Supple; no JVD, Cardiovascular; regular rate and rhythm with S1-S2 no murmur or gallop, Pulmonary; clear bilaterally, Abdomen; soft nondistended bowel sounds are active there is some fullness in the lower abdomen bilaterally no definite palpable mass or hepatosplenomegaly, Rectal; exam not done, Extremities; no clubbing cyanosis or edema skin warm and dry, Psych; mood and affect normal and appropriate.       Assessment & Plan:  #60 72 year old female with 3 month history of lower abdominal and pelvic pain, change in bowel habits and associated weight loss. CT scan is consistent with a metastatic cancer, possibly colonic in etiology, another possibility would be a primary peritoneal cancer given fairly extensive omental involvement. #2 situational anxiety  Plan; Long discussion with the patient and her family today. Have scheduled for colonoscopy with Dr. Rhea Belton at [facility] on Thursday so that her workup can be expedited . Check CEA and CA 125 today Start Xanax( at patient's request for an anxiolytic) 0.25 mg twice daily as needed Start Zofran 4 mg every 6 hours when necessary nausea. Oncology referral is in progress-further plans and workup pending findings at colonoscopy

## 2013-06-02 NOTE — H&P (Signed)
Patient seen, examined, and I agree with the above documentation, including the assessment and plan. Gen: awake, alert, obviously uncomfortable HEENT: anicteric, op clear CV: mildly tachy, regular Pulm: CTA b/l Abd: soft but distended with moderate tenderness, no rebound or guarding, positive hypoactive bowel sounds Ext: no c/c/e Neuro: nonfocal  Colonic ileus after colonoscopy.  Pt with considerable abd cramping.  CT stat ruled out perforation or procedural complication.  Sigmoid colon is narrowed and somewhat fixed likely secondary to extrinsic compression from metastatic disease, and I expect this segment is partially responsible for the retained air proximally Will give IV fluids, clear liquid diet, IV pain control as needed If no flatus by this evening, consider rectal tube at night, the patient does not want rectal tube present

## 2013-06-02 NOTE — Op Note (Signed)
[facility] [address] [place], [address]   COLONOSCOPY PROCEDURE REPORT  PATIENT: Kelly [name], Kelly [name]  MR#: [id number] BIRTHDATE: [birth year] , 71  yrs. old GENDER: Female ENDOSCOPIST: Beverley Fiedler, MD PROCEDURE DATE:  06/02/2013 PROCEDURE:   Colonoscopy with biopsy and Submucosal injection, any substance First Screening Colonoscopy - Avg.  risk and is 50 yrs.  old or older - No.  Prior Negative Screening - Now for repeat screening. N/A  History of Adenoma - Now for follow-up colonoscopy & has been > or = to 3 yrs.  N/A  Polyps Removed Today? No.  Recommend repeat exam, <10 yrs? Yes.  No reason given. ASA CLASS:   Class III INDICATIONS:Abdominal pain, an abnormal CT, and Change in bowel habits. MEDICATIONS: MAC sedation, administered by CRNA and See Anesthesia Report.  DESCRIPTION OF PROCEDURE:   After the risks benefits and alternatives of the procedure were thoroughly explained, informed consent was obtained.  A digital rectal exam revealed no rectal mass.   The [id number] ([id number]) and Pentax Ped Colon [id number] endoscope was introduced through the anus and advanced to the cecum, which was identified by transillumination from the light source, the appendix, and the ileocecal valve. No adverse events experienced.   The quality of the prep was good, using MoviPrep The instrument was then slowly withdrawn as the colon was fully examined.   COLON FINDINGS: A fungating and firm mass measuring approximately 6 X 6cm in size was found near the ileocecal valve and in the ascending colon.  The mass makes visualization in this portion of the colon difficult do to its size (it fills most of the lumen in the right colon) Multiple biopsies of the lesion were performed using cold forceps.  The usual cecal landmarks such as the ileocecal valve and appendiceal orifice were difficult to identify with certainty due to previously mentioned mass.  A tattoo was placed within  the age one fold distal to the mass on opposite walls (3 ML total).  There was severe tortuosity and luminal narrowing in the sigmoid and rectosigmoid colon, query external compression.  No mass was seen in this segment, though it did not distend normally. Mild diverticulosis was noted in the sigmoid colon and rectosigmoid colon.  Retroflexed views revealed no abnormalities.      The scope was withdrawn and the procedure completed.  COMPLICATIONS: There were no complications.  ENDOSCOPIC IMPRESSION: 1.   Mass measuring 6 X 6cm in size were found near the ileocecal valve and in the ascending colon; multiple biopsies of the lesion were performed; tattoo placed distally to mass 2.   Sharp angulation and luminal narrowing in the sigmoid and rectosigmoid colon without mass seen in this location; query extrinsic compression 3.   Mild diverticulosis was noted in the sigmoid colon and rectosigmoid colon  RECOMMENDATIONS: 1.  Await pathology results 2.  Oncology referral (already in place)   eSigned:  Beverley Fiedler, MD 06/02/2013 12:51 PM   cc: The Patient, Eustaquio Boyden MD, Melvia Heaps, MD, and [place], Amy PA-C   PATIENT NAME:  [name], [name] MR#: [id number]

## 2013-06-02 NOTE — H&P (Signed)
Primary Care Physician:  Eustaquio Boyden, MD Primary Gastroenterologist:  [facility]  CHIEF COMPLAINT:  Abdominal pain  HPI: Kelly [name] is a 72 y.o. female who was seen in the office by one of our PA's earlier this week.  She was seen for abnormal CT scan concerning for metastatic cancer along with symptoms of abdominal/pelvic pain, change in bowel habits and associated weight loss.  She underwent colonoscopy today for evaluation, by Dr. Rhea Belton.  On colonoscopy she was found to have a right colon carcinoma and a narrowed sigmoid colon most likely due to extrinsic compression from metastatic disease.  Post procedure she was unable to pass gas and was very uncomfortable.  CT scan was performed and did not show any perforation.  She was given pain medication and levsin but had no improvement in status.  She is going to be admitted for observation.   Past Medical History  Diagnosis Date  . Premature ventricular contractions   . Palpitations     occasional  . Hyperlipidemia   . Osteoarthritis   . Anxiety and depression   . Chronic insomnia   . Osteopenia 01/2012  . Diverticulosis 01/2013    by CT  . DDD (degenerative disc disease), lumbar 01/2013    by CT (L5 B pars defects with 1cm slip, L5/S1 DDD with prominent B foraminal narrowing with mass effect on exiting L5 nerve root)  . GERD (gastroesophageal reflux disease)   . Asthma 06-01-13    no use of rescue inhalers in many years    Past Surgical History  Procedure Laterality Date  . Nsvd      x2  . Btl    . Total abdominal hysterectomy      due to endometriosis w endmetrioma  . Appendectomy    . Stress cadrio lite  04/26/1999    normal ef 76%  . Dexa osteopenia fem neck, spine  08/22/2002  . Abd/ultra sound within normal limits  05/08/2004  . Stress myoview  05/15/2006    normal  . L knee arthroscopy (other)  09/25/2008    Dr. Raford Pitcher  . R knee arthroscopy (other)  06/26/2009    lots of arthritis Dr. Gerrit Heck  . Dexa  01/2012    osteopenia (T score femur -1.9, forearm -1.5)  . Breast surgery Left 06-01-13    Left breast "chip" implanted as marker(none malignant)    Prior to Admission medications   Medication Sig Start Date End Date Taking? Authorizing Provider  acetaminophen (TYLENOL) 500 MG tablet Take 500 mg by mouth every 6 (six) hours as needed for pain.   Yes Historical Provider, MD  ALPRAZolam (XANAX) 0.25 MG tablet Take 0.25 mg by mouth 2 (two) times daily as needed for anxiety.   Yes Historical Provider, MD  loratadine (CLARITIN) 10 MG tablet Take 10 mg by mouth daily.     Yes Historical Provider, MD  omeprazole (PRILOSEC) 20 MG capsule Take 20 mg by mouth 2 (two) times daily.  03/08/13  Yes Eustaquio Boyden, MD  ondansetron (ZOFRAN) 4 MG tablet Take 4 mg by mouth every 6 (six) hours as needed for nausea.   Yes Historical Provider, MD  zolpidem (AMBIEN) 10 MG tablet Take 10 mg by mouth at bedtime.   Yes Historical Provider, MD    Current Facility-Administered Medications  Medication Dose Route Frequency Provider Last Rate Last Dose  . 0.45 % NaCl with KCl 20 mEq / L infusion   Intravenous Continuous Sundeep Cary D. Winslow Ederer, PA-C      .  0.9 %  sodium chloride infusion   Intravenous Continuous Amy S [place], PA-C      . 0.9 %  sodium chloride infusion  250 mL Intravenous PRN Momina Hunton D. Yianna Tersigni, PA-C      . acetaminophen (TYLENOL) tablet 650 mg  650 mg Oral Q6H PRN Princella Pellegrini. Gracelyn Coventry, PA-C       Or  . acetaminophen (TYLENOL) suppository 650 mg  650 mg Rectal Q6H PRN Princella Pellegrini. Keidrick Murty, PA-C      . ALPRAZolam Prudy Feeler) tablet 0.25 mg  0.25 mg Oral TID PRN Princella Pellegrini. Genelda Roark, PA-C      . lactated ringers infusion   Intravenous Continuous Beverley Fiedler, MD 125 mL/hr at 06/02/13 1502    . morphine 2 MG/ML injection 1-2 mg  1-2 mg Intravenous Q2H PRN Azyah Flett D. Sharnee Douglass, PA-C      . ondansetron (ZOFRAN) tablet 4 mg  4 mg Oral Q6H PRN Princella Pellegrini. Carmel Garfield, PA-C       Or  . ondansetron (ZOFRAN) injection 4 mg  4 mg Intravenous Q6H PRN Ger Nicks D.  Shanavia Makela, PA-C      . Melene Muller ON 06/03/2013] pantoprazole (PROTONIX) EC tablet 40 mg  40 mg Oral Q0600 Flora Parks D. Zeinab Rodwell, PA-C      . sodium chloride 0.9 % injection 3 mL  3 mL Intravenous Q12H Lyndel Dancel D. Rayen Palen, PA-C      . sodium chloride 0.9 % injection 3 mL  3 mL Intravenous PRN Genelle Economou D. Berlyn Malina, PA-C      . zolpidem (AMBIEN) tablet 10 mg  10 mg Oral QHS Lichelle Viets D. Tandra Rosado, PA-C        Allergies as of 05/31/2013 - Review Complete 05/31/2013  Allergen Reaction Noted  . Amoxicillin  05/17/2007  . Clarithromycin  05/17/2007    Family History  Problem Relation Age of Onset  . Heart attack Mother   . Aortic stenosis Mother   . Heart disease Mother     MI, aortic stenosis, pacer  . Parkinsonism Father   . Osteoporosis Sister   . Arthritis Sister   . Breast cancer Maternal Aunt   . Stroke Neg Hx   . Drug abuse Neg Hx   . Depression Neg Hx   . Ovarian cancer Neg Hx   . Colon cancer Neg Hx     History   Social History  . Marital Status: Married    Spouse Name: N/A    Number of Children: 1  . Years of Education: N/A   Occupational History  . 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 Smoking status: Never Smoker   . Smokeless tobacco: Never Used  . Alcohol Use: No  . Drug Use: No  . Sexual Activity: Yes   Other Topics Concern  . 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 Lives with husband. Does not regularly exercise.    Son in law is Dr. Vernie Ammons    Review of Systems: Ten point ROS is O/W negative except as mentioned in HPI.  Physical Exam: Vital signs in last 24 hours: Temp:  [98.1 F (36.7 C)-98.3 F (36.8 C)] 98.1 F (36.7 C) (10/02 1246) Pulse Rate:  [96] 96 (10/02 1040) Resp:  [13-20] 13 (10/02 1354) BP: (100-156)/(51-106) 121/101 mmHg (10/02 1354) SpO2:  [97 %-100 %] 100 % (10/02 1320)   Exam per Dr. Rhea Belton (  see below).  Intake/Output  this shift: Total I/O In: 500 [I.V.:500] Out: -   Impression / Plan: -Abdominal pain s/p colonoscopy in a 72 year old female who was found to have a right colon carcinoma and a narrowed sigmoid colon most likely due to extrinsic compression from metastatic disease.  *Will admit to observation for pain control.  IVF's.  Zofran and levsin prn.  Check AM BMET and CBC.  Will give home xanax and ambien.    LOS: 0 days   Martasia Talamante D.  06/02/2013, 3:05 PM

## 2013-06-02 NOTE — Transfer of Care (Signed)
Immediate Anesthesia Transfer of Care Note  Patient: Kelly [name]  Procedure(s) Performed: Procedure(s) (LRB): COLONOSCOPY (N/A)  Patient Location: PACU  Anesthesia Type: MAC  Level of Consciousness: sedated, patient cooperative and responds to stimulation  Airway & Oxygen Therapy: Patient Spontanous Breathing and Patient connected to face mask oxgen  Post-op Assessment: Report given to PACU RN and Post -op Vital signs reviewed and stable  Post vital signs: Reviewed and stable  Complications: No apparent anesthesia complications

## 2013-06-02 NOTE — Interval H&P Note (Signed)
History and Physical Interval Note: Patient seen and examined today. Recent history and CT scan as noted. Plan for colonoscopy now as discussed in clinic. The nature of the procedure, as well as the risks, benefits, and alternatives were carefully and thoroughly reviewed with the patient. Ample time for discussion and questions allowed. The patient understood, was satisfied, and agreed to proceed.    06/02/2013 11:38 AM  Kelly [name]  has presented today for surgery, with the diagnosis of Metastatic cancer [199.1] Other symptoms involving digestive system(787.99) [787.99] Nonspecific (abnormal) findings on radiological and other examination of abdominal area, including retroperitoneum [793.6]  The various methods of treatment have been discussed with the patient and family. After consideration of risks, benefits and other options for treatment, the patient has consented to  Procedure(s): COLONOSCOPY (N/A) as a surgical intervention .  The patient's history has been reviewed, patient examined, no change in status, stable for surgery.  I have reviewed the patient's chart and labs.  Questions were answered to the patient's satisfaction.     Abdulraheem Pineo M

## 2013-06-02 NOTE — Preoperative (Signed)
Beta Blockers   Reason not to administer Beta Blockers:Not Applicable, not on home BB 

## 2013-06-02 NOTE — Anesthesia Preprocedure Evaluation (Signed)
Anesthesia Evaluation  Patient identified by MRN, date of birth, ID band Patient awake    Reviewed: Allergy & Precautions, H&P , NPO status , Patient's Chart, lab work & pertinent test results  Airway Mallampati: II TM Distance: >3 FB Neck ROM: Full    Dental no notable dental hx.    Pulmonary asthma ,  breath sounds clear to auscultation  Pulmonary exam normal       Cardiovascular negative cardio ROS  - dysrhythmias Rhythm:Regular Rate:Normal     Neuro/Psych negative neurological ROS  negative psych ROS   GI/Hepatic Neg liver ROS, GERD-  Medicated and Controlled,  Endo/Other  negative endocrine ROS  Renal/GU negative Renal ROS  negative genitourinary   Musculoskeletal negative musculoskeletal ROS (+)   Abdominal   Peds negative pediatric ROS (+)  Hematology negative hematology ROS (+)   Anesthesia Other Findings   Reproductive/Obstetrics negative OB ROS                           Anesthesia Physical Anesthesia Plan  ASA: II  Anesthesia Plan: MAC   Post-op Pain Management:    Induction:   Airway Management Planned:   Additional Equipment:   Intra-op Plan:   Post-operative Plan:   Informed Consent: I have reviewed the patients History and Physical, chart, labs and discussed the procedure including the risks, benefits and alternatives for the proposed anesthesia with the patient or authorized representative who has indicated his/her understanding and acceptance.   Dental advisory given  Plan Discussed with: CRNA  Anesthesia Plan Comments:         Anesthesia Quick Evaluation

## 2013-06-03 ENCOUNTER — Encounter (HOSPITAL_COMMUNITY): Payer: Self-pay | Admitting: Internal Medicine

## 2013-06-03 LAB — CBC
HCT: 35.6 % — ABNORMAL LOW (ref 36.0–46.0)
Hemoglobin: 11.3 g/dL — ABNORMAL LOW (ref 12.0–15.0)
MCV: 86.8 fL (ref 78.0–100.0)
RBC: 4.1 MIL/uL (ref 3.87–5.11)
WBC: 9.1 10*3/uL (ref 4.0–10.5)

## 2013-06-03 LAB — BASIC METABOLIC PANEL
CO2: 27 mEq/L (ref 19–32)
Chloride: 104 mEq/L (ref 96–112)
Creatinine, Ser: 0.63 mg/dL (ref 0.50–1.10)
GFR calc Af Amer: >90 mL/min (ref >90)
Sodium: 139 mEq/L (ref 135–145)

## 2013-06-03 MED ORDER — OXYCODONE-ACETAMINOPHEN 5-325 MG PO TABS: 1.0000 | ORAL_TABLET | Freq: Four times a day (QID) | ORAL | Status: DC | PRN

## 2013-06-03 MED ORDER — HYOSCYAMINE SULFATE 0.125 MG SL SUBL: 0.1250 mg | SUBLINGUAL_TABLET | Freq: Four times a day (QID) | SUBLINGUAL | Status: DC | PRN

## 2013-06-03 NOTE — Progress Notes (Signed)
Progress Note   Subjective  Patient did well overnight, took Tylenol 325 mg times one for some mild back pain which she felt was secondary to sleeping in a bed Did pass additional flatus and felt better Ate a good breakfast. Ready to go home denies pain currently   Objective  Vital signs in last 24 hours: Temp:  [98.1 F (36.7 C)-100 F (37.8 C)] 98.2 F (36.8 C) (10/03 0605) Pulse Rate:  [88-98] 91 (10/03 0605) Resp:  [13-20] 16 (10/03 0605) BP: (100-156)/(51-106) 132/65 mmHg (10/03 0605) SpO2:  [95 %-100 %] 95 % (10/03 0605) Weight:  [167 lb (75.751 kg)] 167 lb (75.751 kg) (10/02 1611) Last BM Date: 06/02/13 Gen: awake, alert, NAD HEENT: anicteric, op clear CV: RRR, no mrg Pulm: CTA b/l Abd: soft, mild distention, firmness in lower abd, NT,  +BS throughout Ext: no c/c/e Neuro: nonfocal   Intake/Output from previous day: 10/02 0701 - 10/03 0700 In: 2168.3 [I.V.:2168.3] Out: -  Intake/Output this shift:    Lab Results:  Recent Labs  06/03/13 0506  WBC 9.1  HGB 11.3*  HCT 35.6*  PLT 354   BMET  Recent Labs  06/03/13 0506  NA 139  K 3.7  CL 104  CO2 27  GLUCOSE 98  BUN 4*  CREATININE 0.63  CALCIUM 8.9    Studies/Results: Ct Abdomen Pelvis Wo Contrast  06/02/2013   CLINICAL DATA:  Liver metastases. Worsening abdominal pain after recent colonoscopy.  EXAM: CT ABDOMEN AND PELVIS WITHOUT CONTRAST  TECHNIQUE: Multidetector CT imaging of the abdomen and pelvis was performed following the standard protocol without intravenous contrast.  COMPARISON:  05/30/2013  FINDINGS: Multiple low attenuation liver masses are again seen, consistent with liver metastases. Diffuse gaseous distention of the colon again demonstrated, consistent with ileus. No evidence of free air. Multiple small soft tissue nodules are again seen within the omental fat and there is also mild mesenteric lymphadenopathy, consistent with metastatic disease. Small amount of pelvic ascites is  seen.  A persistent masslike soft tissue density is seen involving the right colon at the level of the ileocecal valve which measures 4.5 cm and is suspicious for primary colon carcinoma. The largest mesenteric lymph node seen in the right lower quadrant measures 1.5 cm which is stable. No evidence of acute inflammatory process or abscess.  Incidentally noted is bilateral pars defects at L5 with degenerative disc disease and grade 2 anterolisthesis at L5-S1.  IMPRESSION: Diffuse colonic distention, consistent with ileus.  4.5 cm mass involving the right colon at the level of the ileocecal valve, consistent with primary colon carcinoma.  Stable mesenteric lymphadenopathy, consistent with metastatic disease.  Stable omental carcinomatosis and small amount of ascites.  Stable diffuse liver metastases.   Electronically Signed   By: Myles Rosenthal   On: 06/02/2013 15:04   Path -- Positive for invasive adenocarcinoma   Assessment & Plan  72 year old admitted after colonoscopy with retained air and abdominal cramping pain secondary to post procedural colonic ileus  1.  Colon cancer/post-procedure ileus -- ileus has resolved and she is back to her baseline.  Colon cancer is a new diagnosis for her and now confirmed by biopsy.  She already has oncology referral next Thursday with Dr. Truett Perna.  I've asked that she start MiraLax 17 g daily to avoid constipation. She has a narrowed sigmoid and rectosigmoid colonic segment likely from extrinsic compression, and MiraLax will hopefully help her avoid constipation and cramping pain.  I will send her with  a prescription for oxycodone/Tylenol 5/325 mg to be used every 6 hours as needed only for moderate to severe pain.  I do not expect she will need this often but will her to have if necessary.  I have asked that she call my office if she has further issues with abdominal pain, constipation, fever chills, nausea or vomiting before her meeting with Dr. Truett Perna. They voice  understanding   Active Problems:   * No active hospital problems. *     LOS: 1 day   Kelly [name]  06/03/2013, 8:22 AM

## 2013-06-03 NOTE — Discharge Summary (Signed)
Patient seen, examined, and I agree with the above documentation. Pathology results did return before the patient was discharged and I shared with the patient, her husband and her daughter that the biopsies showed invasive adenocarcinoma of the colon She took the results as well as could be expected, and is ready to move forward to discuss consideration of therapy She has oncology appointment set up for next Thursday with Dr. Truett Perna

## 2013-06-03 NOTE — Discharge Summary (Signed)
[facility] Discharge Summary  Name: Kelly [name] MRN: [id number] DOB: 1941-07-07 72 y.o. PCP:  Eustaquio Boyden, MD  Date of Admission: 06/02/2013 10:06 AM Date of Discharge: 06/03/2013 Attending Physician: Beverley Fiedler, MD  Discharge Diagnosis:  Abdominal pain s/p colonoscopy       Metastatic colon cancer  Consultations:  None  Procedures Performed:  Ct Abdomen Pelvis Wo Contrast  06/02/2013   CLINICAL DATA:  Liver metastases. Worsening abdominal pain after recent colonoscopy.  EXAM: CT ABDOMEN AND PELVIS WITHOUT CONTRAST  TECHNIQUE: Multidetector CT imaging of the abdomen and pelvis was performed following the standard protocol without intravenous contrast.  COMPARISON:  05/30/2013  FINDINGS: Multiple low attenuation liver masses are again seen, consistent with liver metastases. Diffuse gaseous distention of the colon again demonstrated, consistent with ileus. No evidence of free air. Multiple small soft tissue nodules are again seen within the omental fat and there is also mild mesenteric lymphadenopathy, consistent with metastatic disease. Small amount of pelvic ascites is seen.  A persistent masslike soft tissue density is seen involving the right colon at the level of the ileocecal valve which measures 4.5 cm and is suspicious for primary colon carcinoma. The largest mesenteric lymph node seen in the right lower quadrant measures 1.5 cm which is stable. No evidence of acute inflammatory process or abscess.  Incidentally noted is bilateral pars defects at L5 with degenerative disc disease and grade 2 anterolisthesis at L5-S1.  IMPRESSION: Diffuse colonic distention, consistent with ileus.  4.5 cm mass involving the right colon at the level of the ileocecal valve, consistent with primary colon carcinoma.  Stable mesenteric lymphadenopathy, consistent with metastatic disease.  Stable omental carcinomatosis and small amount of ascites.  Stable diffuse liver metastases.    Electronically Signed   By: Myles Rosenthal   On: 06/02/2013 15:04   Ct Abdomen Pelvis W Contrast  05/30/2013   *RADIOLOGY REPORT*  Clinical Data: Lower abdominal pain, weight loss, bowel movement changes, pain started in June and has continued for several months with 11 pounds weight loss  CT ABDOMEN AND PELVIS WITH CONTRAST  Technique:  Multidetector CT imaging of the abdomen and pelvis was performed following the standard protocol during bolus administration of intravenous contrast.  Contrast: OMNIPAQUE IOHEXOL 300 MG/ML  SOLN mass  Comparison: 02/15/2013  Findings: There are innumerable peripherally enhancing hypo attenuating hepatic lesions involving the left and right lobes of the liver.  The largest is in the lateral left lobe and measures 41 x 42 mm.  The prior study was performed without contrast with result in decreased sensitivity for potential solid organ abnormalities.  The spleen and pancreas are normal.  The adrenal glands are normal.  There is a 7 ml cyst in the lateral cortex mid pole of the left kidney.  Kidneys are otherwise normal.  Aorta is normal.  Bladder is normal.  Gallbladder is normal. Reproductive organs not visualized.  There is a small volume of ascites in the pelvis.  In addition to diverticulosis, there is irregular thickening of the mid to distal sigmoid colon which may represent a mass.  This is seen best on image #67 series 2.  On image number 65, there is a 15 x 14 mm rounded lesion within the mesentery of the right anterior pelvis.  This was present previously.  It is possible that this represents a remaining mobilized ovary, versus a lymph node.  There is no retroperitoneal adenopathy.  There are a few small anterior diaphragmatic lymph nodes  to the right of midline, the largest measuring about 7 mm. It cannot be determined whether these were present previously as this area was not imaged on the prior study. There is omental thickening to 17 mm, with numerous anterior  mesenteric lymph nodes in the abdomen, measuring up to 17 mm in short axis, and these were not present previously.  The visualized portions of the lung bases are clear.  Bone windows reveal no acute osseous abnormalities.  There is grade II anterolisthesis of L5 on the sacrum which appears to be due to bilateral pars defects, with severe degenerative disc disease associated.  IMPRESSION: Findings consistent with hepatic metastasis likely from colon carcinoma.  Area of distal sigmoid colon appears abnormal, concerning for malignancy. There is also evidence of metastatic involvement of mesenteric lymph nodes and omental involvement. There is a small volume of potentially metastatic ascites.   Original Report Authenticated By: Esperanza Heir, M.D.    GI Procedures:  Colonoscopy on 102 by Dr. Rhea Belton with a right colon mass and a narrowed sigmoid colon most likely due to extrinsic compression from metastatic disease.  Biopsy results show invasive adenocarcinoma.  History/Physical Exam:  See Admission H&P  Admission HPI:  Patient underwent colonoscopy on 10/2 by Dr. Rhea Belton for evaluation of weight loss, lower abdominal/pelvic pain, change in bowel habits and abnormal CT scan. On colonoscopy she was found to have a right colon mass and a narrowed sigmoid colon most likely due to extrinsic compression from metastatic disease. Post procedure she was unable to pass gas and was very uncomfortable. CT scan was performed and did not show any perforation. She was given pain medication and levsin but had no improvement in status. She was admitted for observation and pain control.  Did well overnight and was able to pass gas.  Ate a good breakfast and did not have any pain on the morning of 10/3.  Was discharged with pain medication and anti-spasmodic.   Discharge Vitals:  BP 132/65  Pulse 91  Temp(Src) 98.2 F (36.8 C) (Oral)  Resp 16  Ht 5\' 5"  (1.651 m)  Wt 167 lb (75.751 kg)  BMI 27.79 kg/m2  SpO2  95%  Discharge Labs:  Results for orders placed during the hospital encounter of 06/02/13 (from the past 24 hour(s))  BASIC METABOLIC PANEL     Status: Abnormal   Collection Time    06/03/13  5:06 AM      Result Value Range   Sodium 139  135 - 145 mEq/L   Potassium 3.7  3.5 - 5.1 mEq/L   Chloride 104  96 - 112 mEq/L   CO2 27  19 - 32 mEq/L   Glucose, Bld 98  70 - 99 mg/dL   BUN 4 (*) 6 - 23 mg/dL   Creatinine, Ser 4.09  0.50 - 1.10 mg/dL   Calcium 8.9  8.4 - 81.1 mg/dL   GFR calc non Af Amer 89 (*) >90 mL/min   GFR calc Af Amer >90  >90 mL/min  CBC     Status: Abnormal   Collection Time    06/03/13  5:06 AM      Result Value Range   WBC 9.1  4.0 - 10.5 K/uL   RBC 4.10  3.87 - 5.11 MIL/uL   Hemoglobin 11.3 (*) 12.0 - 15.0 g/dL   HCT 91.4 (*) 78.2 - 95.6 %   MCV 86.8  78.0 - 100.0 fL   MCH 27.6  26.0 - 34.0 pg   MCHC  31.7  30.0 - 36.0 g/dL   RDW 40.9  81.1 - 91.4 %   Platelets 354  150 - 400 K/uL    Disposition and follow-up:   Kelly [name] was discharged from [facility] in stable condition.    Follow-up Appointments: Discharge Orders   Future Appointments Provider Department Dept Phone   06/09/2013 1:30 PM [facility] [phone]   06/09/2013 2:00 PM Ladene Artist, MD [facility] MEDICAL ONCOLOGY [phone]   06/09/2013 3:15 PM Anabel Bene, RD [facility] MEDICAL ONCOLOGY [phone]   Future Orders Complete By Expires   Activity as tolerated - No restrictions  As directed    Call MD for:  redness, tenderness, or signs of infection (pain, swelling, bleeding, redness, odor or green/yellow discharge around incision site)  As directed    Call MD for:  severe or increased pain, loss or decreased feeling  in affected limb(s)  As directed    Call MD for:  temperature >100.5  As directed    Discharge instructions  As directed    Comments:     Call my office with  any abdominal pain, significant diarrhea or constipation, rectal bleeding, fever or chills, nausea or vomiting.   Resume previous diet  As directed       Discharge Medications:   Medication List         acetaminophen 500 MG tablet  Commonly known as:  TYLENOL  Take 500 mg by mouth every 6 (six) hours as needed for pain.     ALPRAZolam 0.25 MG tablet  Commonly known as:  XANAX  Take 0.25 mg by mouth 2 (two) times daily as needed for anxiety.     CLARITIN 10 MG tablet  Generic drug:  loratadine  Take 10 mg by mouth daily.     hyoscyamine 0.125 MG SL tablet  Commonly known as:  LEVSIN SL  Place 1 tablet (0.125 mg total) under the tongue every 6 (six) hours as needed for cramping.     omeprazole 20 MG capsule  Commonly known as:  PRILOSEC  Take 20 mg by mouth 2 (two) times daily.     ondansetron 4 MG tablet  Commonly known as:  ZOFRAN  Take 4 mg by mouth every 6 (six) hours as needed for nausea.     oxyCODONE-acetaminophen 5-325 MG per tablet  Commonly known as:  ROXICET  Take 1 tablet by mouth every 6 (six) hours as needed for pain (moderate to severe pain).     zolpidem 10 MG tablet  Commonly known as:  AMBIEN  Take 10 mg by mouth at bedtime.        SignedCristi Loron, Mellony Danziger D. 06/03/2013, 8:47 AM

## 2013-06-03 NOTE — Progress Notes (Signed)
Pt was stable at time of discharge. IV was removed. Reviewed discharge education with family and patient. They verbalized understanding and did not have further questions. Patient and family left with belongings, discharge packet, and prescription.

## 2013-06-03 NOTE — Anesthesia Postprocedure Evaluation (Signed)
  Anesthesia Post-op Note  Patient: Kelly [name]  Procedure(s) Performed: Procedure(s) (LRB): COLONOSCOPY (N/A)  Patient Location: PACU  Anesthesia Type: mac  Level of Consciousness: awake and alert   Airway and Oxygen Therapy: Patient Spontanous Breathing  Post-op Pain: mild  Post-op Assessment: Post-op Vital signs reviewed, Patient's Cardiovascular Status Stable, Respiratory Function Stable, Patent Airway and No signs of Nausea or vomiting  Last Vitals:  Filed Vitals:   06/03/13 0605  BP: 132/65  Pulse: 91  Temp: 36.8 C  Resp: 16    Post-op Vital Signs: stable   Complications: No apparent anesthesia complications

## 2013-06-08 ENCOUNTER — Other Ambulatory Visit: Payer: Self-pay | Admitting: Family Medicine

## 2013-06-08 ENCOUNTER — Telehealth: Payer: Self-pay

## 2013-06-08 MED ORDER — TRAMADOL HCL 50 MG PO TABS: ORAL_TABLET | ORAL | Status: DC

## 2013-06-08 NOTE — Telephone Encounter (Signed)
We discussed in the hospital (with patient and husband) that she was previously intolerant to hydrocodone. It is very possible the oxycodone has caused nausea She continues Zofran 4-8 mg every 6-8 hours as needed for nausea, max 24 mg in 24 hours She can try tramadol 50-100 mg every 6-8 hours as needed for pain until her appointment tomorrow with Dr. Truett Perna (max 400 mg in 24 hours).  Would start with 50 mg for pain.

## 2013-06-08 NOTE — Telephone Encounter (Signed)
Spoke with daughter and she is aware. Script sent to [facility] for the Tramadol.

## 2013-06-08 NOTE — Telephone Encounter (Signed)
Today pt started having some epigastric pain. Pts daughter states that she took 1/2 of the oxycodone with acetaminophen this afternoon and now she is vomiting. Pt has an appt with Oncology tomorrow but daughter would like to know if we could prescribe something to keep her comfortable until they see Dr. Truett Perna tomorrow. Dr. Rhea Belton please advise.

## 2013-06-08 NOTE — Telephone Encounter (Signed)
plz phone in. 

## 2013-06-08 NOTE — Telephone Encounter (Signed)
Rx called in as directed.   

## 2013-06-09 ENCOUNTER — Telehealth: Payer: Self-pay | Admitting: Oncology

## 2013-06-09 ENCOUNTER — Encounter: Payer: Self-pay | Admitting: Oncology

## 2013-06-09 ENCOUNTER — Ambulatory Visit: Payer: Medicare Other | Admitting: Nutrition

## 2013-06-09 ENCOUNTER — Ambulatory Visit (HOSPITAL_BASED_OUTPATIENT_CLINIC_OR_DEPARTMENT_OTHER): Payer: Medicare Other | Admitting: Oncology

## 2013-06-09 ENCOUNTER — Ambulatory Visit: Payer: Medicare Other

## 2013-06-09 VITALS — BP 142/76 | HR 106 | Temp 98.3°F | Resp 18 | Ht 65.0 in | Wt 169.1 lb

## 2013-06-09 DIAGNOSIS — R112 Nausea with vomiting, unspecified: Secondary | ICD-10-CM

## 2013-06-09 DIAGNOSIS — C180 Malignant neoplasm of cecum: Secondary | ICD-10-CM

## 2013-06-09 DIAGNOSIS — C189 Malignant neoplasm of colon, unspecified: Secondary | ICD-10-CM

## 2013-06-09 DIAGNOSIS — R109 Unspecified abdominal pain: Secondary | ICD-10-CM

## 2013-06-09 DIAGNOSIS — C772 Secondary and unspecified malignant neoplasm of intra-abdominal lymph nodes: Secondary | ICD-10-CM

## 2013-06-09 NOTE — Progress Notes (Signed)
Checked in new pt with no financial concerns. °

## 2013-06-09 NOTE — Telephone Encounter (Signed)
Gave pt appt for lab , MD and chemo class for October , emailed [place] regarding chemo

## 2013-06-09 NOTE — Progress Notes (Signed)
Patient is a 72 year old female diagnosed with metastatic colon cancer.  She is a patient of Dr. Truett Perna.  Past medical history includes hyperlipidemia, anxiety, depression, insomnia, osteoarthritis, and vitamin D deficiency.  Medications include Xanax, Prilosec, and Zofran.  Labs include glucose of 105 on Sept 29.  Height: 65 inches. Weight: 169.1 pounds on October 9. Usual body weight in 2009 was documented as 179 pounds. BMI: 28.14.  Patient reports poor appetite.  She has lost approximately 10 pounds from usual body weight.  She describes herself as almost being afraid to eat.  M.D. recommending low residue, low fiber diet.  Nutrition diagnosis: Unintended weight loss related to new diagnosis of metastatic colon cancer and associated treatments as evidenced by 10 pound weight loss from usual body weight.  Intervention: Patient was educated to consume small, frequent meals with high-calorie, high-protein foods.  I reviewed a low fiber diet with patient.  I have encouraged patient to consume oral nutrition supplements between meals.  I provided fact sheets, oral nutrition supplement samples, coupons, and contact information.  Questions were answered.  Teach back method used.  Monitoring, evaluation, goals:  Patient will increase oral intake to 6 small meals and snacks daily.   She will consume oral nutrition supplements twice a day.   Patient will strive for weight maintenance.  Next visit: I will followup with patient during chemotherapy once scheduled.  She has my contact information for questions if needed.

## 2013-06-09 NOTE — Progress Notes (Signed)
[facility] New Patient Consult   Referring ZO:XWRUEA Darnelle Derrick 72 y.o.  1941-04-04    Reason for Referral: Colon cancer     HPI: She reports a history of suprapubic discomfort beginning in June of this year. She had intermittent diarrhea for 2 months during the summer and has developed constipation recently. She saw Dr.Gutierrez on 05/30/2013 and was referred for a CT of the abdomen and pelvis. Innumerable hepatic lesions were noted in the left and right lobes. The adrenal glands appeared normal. Small volume of pelvic ascites. Irregular thickening of the mid to distal sigmoid colon felt to potentially represent a mass. Omental thickening with numerous anterior mesenteric lymph nodes in the abdomen. The lung bases were clear.  She was referred to Dr.Pyrtle and was taken to a colonoscopy on 06/02/2013. A fungating mass was found near the ileocecal valve and in the a sending colon. Multiple biopsies were obtained. A tattoo was placed. Tortuosity and luminal narrowing was noted in the sigmoid and rectosigmoid colon,? External compression. No mass was seen in the segment.  The pathology [id number]) revealed invasive adenocarcinoma.  She was unable to pass flatus and had discomfort following the colonoscopy procedure. She was admitted. A CT scan showed no perforation. She felt better and was discharged to home on 06/03/2013. A CT on 06/02/2013 revealed gaseous distention of the colon, no free air. A masslike soft tissue density was noted in the right colon at the level of the ileocecal valve. Stable omental carcinomatosis and mesenteric lymphadenopathy.  She continues to have low abdominal discomfort. She developed acute nausea and vomiting yesterday. She reports 4 episodes of emesis. She had a bowel movement yesterday morning. The nausea is improved today.   Past Medical History  Diagnosis Date  . Premature ventricular contractions   . Palpitations     occasional  . Hyperlipidemia   . Osteoarthritis   . Anxiety and depression   . Chronic insomnia   . Osteopenia 01/2012  . Diverticulosis 01/2013    by CT  . DDD (degenerative disc disease), lumbar 01/2013    by CT (L5 B pars defects with 1cm slip, L5/S1 DDD with prominent B foraminal narrowing with mass effect on exiting L5 nerve root)  . GERD (gastroesophageal reflux disease)   . Asthma 06-01-13    no use of rescue inhalers in many years    .  G3, P2-one tubal pregnancy  Past Surgical History  Procedure Laterality Date  . Nsvd      x2  . Btl    . Total abdominal hysterectomy      due to endometriosis w endmetrioma  . Appendectomy   age 37   . Stress cadrio lite  04/26/1999    normal ef 76%  . Dexa osteopenia fem neck, spine  08/22/2002  . Abd/ultra sound within normal limits  05/08/2004  . Stress myoview  05/15/2006    normal  . L knee arthroscopy (other)  09/25/2008    Dr. Raford Pitcher  . R knee arthroscopy (other)  06/26/2009    lots of arthritis Dr. Gerrit Heck  . Dexa  01/2012    osteopenia (T score femur -1.9, forearm -1.5)  . Breast surgery Left 06-01-13    Left breast "chip" implanted as marker(none malignant)  . Colonoscopy N/A 06/02/2013    Procedure: COLONOSCOPY;  Surgeon: Beverley Fiedler, MD;  Location: [facility];  Service: Gastroenterology;  Laterality: N/A;    Family History  Problem Relation Age of  Onset  . Heart attack Mother   . Aortic stenosis Mother   . Heart disease Mother     MI, aortic stenosis, pacer  . Parkinsonism Father   . Osteoporosis Sister   . Arthritis Sister   . Breast cancer Maternal Aunt   . Stroke Neg Hx   . Drug abuse Neg Hx   . Depression Neg Hx   . Ovarian cancer Neg Hx   . Colon cancer Neg Hx   2 brothers, one sister. 2 children. No other family history of cancer.  Current outpatient prescriptions:acetaminophen (TYLENOL) 500 MG tablet, Take 500 mg by mouth every 6 (six) hours as needed for pain., Disp: , Rfl: ;  ALPRAZolam (XANAX) 0.25 MG  tablet, Take 0.25 mg by mouth 2 (two) times daily as needed for anxiety., Disp: , Rfl: ;  glycerin, Pediatric, 1.2 G SUPP, Place 1 suppository rectally as needed., Disp: , Rfl:  hyoscyamine (LEVSIN SL) 0.125 MG SL tablet, Place 1 tablet (0.125 mg total) under the tongue every 6 (six) hours as needed for cramping., Disp: 30 tablet, Rfl: 0;  loratadine (CLARITIN) 10 MG tablet, Take 10 mg by mouth daily.  , Disp: , Rfl: ;  omeprazole (PRILOSEC) 20 MG capsule, Take 20 mg by mouth 2 (two) times daily. , Disp: , Rfl: ;  ondansetron (ZOFRAN) 4 MG tablet, Take 4 mg by mouth every 6 (six) hours as needed for nausea., Disp: , Rfl:  polyethylene glycol (MIRALAX / GLYCOLAX) packet, Take 17 g by mouth daily., Disp: , Rfl: ;  sennosides-docusate sodium (SENOKOT-S) 8.6-50 MG tablet, Take 1 tablet by mouth 2 (two) times daily as needed for constipation., Disp: , Rfl: ;  traMADol (ULTRAM) 50 MG tablet, Take 1-2 po every 8 hours prn pain, Disp: 30 tablet, Rfl: 0;  zolpidem (AMBIEN) 10 MG tablet, TAKE ONE TABLET BY MOUTH NIGHTLY AT BEDTIME AS NEEDED , Disp: 30 tablet, Rfl: 1  Allergies:  Allergies  Allergen Reactions  . Amoxicillin     REACTION: RASH  . Clarithromycin     REACTION: GI UPSET  . Codeine Nausea Only and Palpitations    Social History: She lives with her husband in [place], she is retired from an office occupation. She does not use tobacco or alcohol.. No risk factor for hepatitis.  ROS:   Positives include: Anorexia, 11 pound weight loss since June of 2014, chronic hot flashes, increased abdominal pain after eating, nausea and vomiting on a 06/08/2013, diarrhea during the summer of 2014, constipation at present  A complete ROS was otherwise negative.  Physical Exam:  Blood pressure 142/76, pulse 106, temperature 98.3 F (36.8 C), temperature source Oral, resp. rate 18, height 5\' 5"  (1.651 m), weight 169 lb 1.6 oz (76.703 kg).  HEENT: Oropharynx without visible mass, neck without  mass Lungs: Clear bilaterally Cardiac: Regular rate and rhythm Abdomen: No hepatomegaly, firm fullness in the right lower abdomen without a discrete mass. No apparent ascites. No splenomegaly.  Vascular: No leg edema Lymph nodes: No cervical, supraclavicular, axillary, or inguinal nodes Neurologic: Alert and oriented, the motor exam appears intact in the upper and lower extremities Skin: No rash Musculoskeletal: No spine tenderness   LAB:  CBC  Lab Results  Component Value Date   WBC 9.1 06/03/2013   HGB 11.3* 06/03/2013   HCT 35.6* 06/03/2013   MCV 86.8 06/03/2013   PLT 354 06/03/2013   hemoglobin 12.8, ANC 6.6 on 05/30/2013  CMP      Component Value Date/Time  NA 139 06/03/2013 0506   K 3.7 06/03/2013 0506   CL 104 06/03/2013 0506   CO2 27 06/03/2013 0506   GLUCOSE 98 06/03/2013 0506   BUN 4* 06/03/2013 0506   CREATININE 0.63 06/03/2013 0506   CALCIUM 8.9 06/03/2013 0506   PROT 8.0 05/30/2013 0943   ALBUMIN 4.0 05/30/2013 0943   AST 18 05/30/2013 0943   ALT 9 05/30/2013 0943   ALKPHOS 81 05/30/2013 0943   BILITOT 0.6 05/30/2013 0943   GFRNONAA 89* 06/03/2013 0506   GFRAA >90 06/03/2013 0506   05/31/2013: CEA 9.4, CA 125 186.8  Radiology: I discussed the CT scan findings with Ms. Samonte and her family. I reviewed the images. She did not wish to see the CT images.    Assessment/Plan:   1. Stage IV colon cancer, colonoscopy 06/02/2013 found a mass at the cecum with a biopsy confirming adenocarcinoma  2. 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 Acute nausea/vomiting 06/08/2013-? Related to partial obstruction-improved  4. History of an arrhythmia   Disposition:   Ms. Cuda has been diagnosed with metastatic colon cancer. I discussed the diagnosis and treatment options with Ms. Wiswell and her family. She had nausea and vomiting yesterday, but this has not been a consistent symptom. She could be developing early obstruction from the cecal tumor  or extrinsic compression of the bowel elsewhere. She will continue MiraLAX daily. We added a stool softener and she will follow a low residual diet. We will make a surgical referral if she has further obstructive symptoms.  I recommend treatment with systemic therapy. I explained that most patients with metastatic colon cancer do not require resection of the primary tumor. We discussed FOLFIRI and FOLFOX chemotherapy. We discussed the [facility] irinotecan dose escalation study. She is most comfortable with FOLFOX chemotherapy. I recommend the addition of Avastin.  We discussed the potential toxicities associated with the FOLFOX regimen including the chance for nausea/vomiting, mucositis, diarrhea, and hematologic toxicity. We discussed the skin rash, hyperpigmentation, and hand/foot syndrome associated with 5-fluorouracil. We reviewed the various types of neuropathy seen with oxaliplatin. I reviewed the hypertension, allergic reaction, bleeding, bowel perforation, nephrotoxicity, and increased risk of thromboembolic disease with Avastin. We also discussed the delayed wound healing associated with Avastin. She will attend a chemotherapy teaching class.  Ms. Jagger would like to begin systemic therapy soon. We will refer her for placement of a Port-A-Cath with the plan to begin a first cycle of FOLFOX on 06/15/2013. Avastin will be added with cycle 2. She will be scheduled for an office visit with cycle 2. I will present her case at the GI tumor conference on 06/15/2013. We will submit the colon biopsy material for K-ras testing and microsatellite instability testing.  Ms. Toenjes met with the cancer Center nutritionist today.  Approximately 60 minutes were spent with the patient and her family today. The majority of the time was used for counseling and coordination of care.  Millee Denise 06/09/2013, 6:04 PM

## 2013-06-09 NOTE — Progress Notes (Signed)
Met with Kelly [name] and family. Explained role of nurse navigator. Educational information provided on colon cancer.  Reviewed porta a cath information and provided written information to patient.  Referral made to dietician for diet education and she was seen today. [facility] resources provided to patient, including SW service information.  Contact names and phone numbers were provided for entire [facility] team.  No barriers to care identified.  Will continue to follow as needed.

## 2013-06-09 NOTE — Progress Notes (Signed)
Put fmla form on nurse's desk °

## 2013-06-09 NOTE — Progress Notes (Signed)
Request sent to pathology re:  Accession: [id number] for KRAS, IHC/MSI testing  per order from Dr. Truett Perna.

## 2013-06-10 ENCOUNTER — Telehealth: Payer: Self-pay | Admitting: Oncology

## 2013-06-10 ENCOUNTER — Other Ambulatory Visit: Payer: Self-pay | Admitting: *Deleted

## 2013-06-10 ENCOUNTER — Telehealth: Payer: Self-pay | Admitting: *Deleted

## 2013-06-10 ENCOUNTER — Other Ambulatory Visit: Payer: Self-pay | Admitting: Radiology

## 2013-06-10 ENCOUNTER — Encounter (HOSPITAL_COMMUNITY): Payer: Self-pay | Admitting: Pharmacy Technician

## 2013-06-10 MED ORDER — LIDOCAINE-PRILOCAINE 2.5-2.5 % EX CREA: TOPICAL_CREAM | CUTANEOUS | Status: AC

## 2013-06-10 NOTE — Telephone Encounter (Signed)
Per staff message and POF I have scheduled appts.  JMW  

## 2013-06-10 NOTE — Telephone Encounter (Signed)
Per nurse navigator I have moved appts. Nurse to notify patient

## 2013-06-10 NOTE — Telephone Encounter (Signed)
Talked to pt and gave her all appts for lab, MD ,chemo and chemo class on 06/13/13

## 2013-06-10 NOTE — Telephone Encounter (Signed)
Talked to pt and gave her appt for 10/13 lab and chemo, pt will try to secure a ride

## 2013-06-12 ENCOUNTER — Other Ambulatory Visit: Payer: Self-pay | Admitting: Oncology

## 2013-06-13 ENCOUNTER — Inpatient Hospital Stay: Payer: Medicare Other

## 2013-06-13 ENCOUNTER — Ambulatory Visit: Payer: Medicare Other

## 2013-06-13 NOTE — Progress Notes (Signed)
Met with patient during chemo class.  Returned [employer] papers (requested by her daughter)  to patient.  Patient informed that the EMLA cream was called into her pharmacy for pick-up.  No barriers to care identified at present time.  She understands to call for needs.  Will continue to follow patient.

## 2013-06-14 ENCOUNTER — Other Ambulatory Visit: Payer: Medicare Other

## 2013-06-14 ENCOUNTER — Ambulatory Visit (HOSPITAL_COMMUNITY)
Admission: RE | Admit: 2013-06-14 | Discharge: 2013-06-14 | Disposition: A | Discharge status: Home / Self Care | Payer: Medicare Other | Source: Ambulatory Visit | Attending: Oncology | Admitting: Oncology

## 2013-06-14 ENCOUNTER — Other Ambulatory Visit: Payer: Self-pay | Admitting: Oncology

## 2013-06-14 ENCOUNTER — Encounter (HOSPITAL_COMMUNITY): Payer: Self-pay

## 2013-06-14 DIAGNOSIS — C801 Malignant (primary) neoplasm, unspecified: Secondary | ICD-10-CM | POA: Insufficient documentation

## 2013-06-14 DIAGNOSIS — E785 Hyperlipidemia, unspecified: Secondary | ICD-10-CM | POA: Insufficient documentation

## 2013-06-14 DIAGNOSIS — K219 Gastro-esophageal reflux disease without esophagitis: Secondary | ICD-10-CM | POA: Insufficient documentation

## 2013-06-14 DIAGNOSIS — C189 Malignant neoplasm of colon, unspecified: Secondary | ICD-10-CM | POA: Insufficient documentation

## 2013-06-14 LAB — CBC WITH DIFFERENTIAL/PLATELET
Basophils Absolute: 0 10*3/uL (ref 0.0–0.1)
Eosinophils Absolute: 0.1 10*3/uL (ref 0.0–0.7)
Eosinophils Relative: 1 % (ref 0–5)
HCT: 39.1 % (ref 36.0–46.0)
Hemoglobin: 12.2 g/dL (ref 12.0–15.0)
Lymphocytes Relative: 15 % (ref 12–46)
MCH: 26.8 pg (ref 26.0–34.0)
MCV: 85.9 fL (ref 78.0–100.0)
Monocytes Absolute: 1 10*3/uL (ref 0.1–1.0)
Monocytes Relative: 11 % (ref 3–12)
Platelets: 430 10*3/uL — ABNORMAL HIGH (ref 150–400)
RBC: 4.55 MIL/uL (ref 3.87–5.11)
RDW: 13.6 % (ref 11.5–15.5)

## 2013-06-14 LAB — PROTIME-INR: Prothrombin Time: 13.2 seconds (ref 11.6–15.2)

## 2013-06-14 MED ORDER — MIDAZOLAM HCL 2 MG/2ML IJ SOLN
INTRAMUSCULAR | Status: AC
  Filled 2013-06-14: qty 6

## 2013-06-14 MED ORDER — FENTANYL CITRATE 0.05 MG/ML IJ SOLN
INTRAMUSCULAR | Status: AC
  Filled 2013-06-14: qty 6

## 2013-06-14 MED ORDER — MIDAZOLAM HCL 2 MG/2ML IJ SOLN
INTRAMUSCULAR | Status: AC | PRN
  Administered 2013-06-14 (×4): 1 mg via INTRAVENOUS
  Administered 2013-06-14: 0.5 mg via INTRAVENOUS

## 2013-06-14 MED ORDER — SODIUM CHLORIDE 0.9 % IV SOLN: INTRAVENOUS | Status: DC

## 2013-06-14 MED ORDER — FENTANYL CITRATE 0.05 MG/ML IJ SOLN
INTRAMUSCULAR | Status: AC | PRN
  Administered 2013-06-14 (×2): 50 ug via INTRAVENOUS
  Administered 2013-06-14: 100 ug via INTRAVENOUS
  Administered 2013-06-14: 25 ug via INTRAVENOUS

## 2013-06-14 MED ORDER — LIDOCAINE-EPINEPHRINE (PF) 2 %-1:200000 IJ SOLN
INTRAMUSCULAR | Status: AC
  Filled 2013-06-14: qty 20

## 2013-06-14 MED ORDER — HEPARIN SOD (PORK) LOCK FLUSH 100 UNIT/ML IV SOLN
INTRAVENOUS | Status: AC | PRN
  Administered 2013-06-14: 500 [IU]

## 2013-06-14 MED ORDER — VANCOMYCIN HCL IN DEXTROSE 1-5 GM/200ML-% IV SOLN
1000.0000 mg | Freq: Once | INTRAVENOUS | Status: AC
  Administered 2013-06-14: 1000 mg via INTRAVENOUS
  Filled 2013-06-14: qty 200

## 2013-06-14 NOTE — H&P (Signed)
Chief Complaint: "I'm here for a port" Referring Physician:Sherrill HPI: Kelly [name] is an 72 y.o. female with stage IV colon cancer. She is to start chemotherapy tomorrow and is scheduled for Portacath placement today. PMHx and meds reviewed. Pt feels well, no recent fevers, chills, illness.  Past Medical History:  Past Medical History  Diagnosis Date  . Premature ventricular contractions   . Palpitations     occasional  . Hyperlipidemia   . Osteoarthritis   . Anxiety and depression   . Chronic insomnia   . Osteopenia 01/2012  . Diverticulosis 01/2013    by CT  . DDD (degenerative disc disease), lumbar 01/2013    by CT (L5 B pars defects with 1cm slip, L5/S1 DDD with prominent B foraminal narrowing with mass effect on exiting L5 nerve root)  . GERD (gastroesophageal reflux disease)   . Asthma 06-01-13    no use of rescue inhalers in many years    Past Surgical History:  Past Surgical History  Procedure Laterality Date  . Nsvd      x2  . Btl    . Total abdominal hysterectomy      due to endometriosis w endmetrioma  . Appendectomy    . Stress cadrio lite  04/26/1999    normal ef 76%  . Dexa osteopenia fem neck, spine  08/22/2002  . Abd/ultra sound within normal limits  05/08/2004  . Stress myoview  05/15/2006    normal  . L knee arthroscopy (other)  09/25/2008    Dr. Raford Pitcher  . R knee arthroscopy (other)  06/26/2009    lots of arthritis Dr. Gerrit Heck  . Dexa  01/2012    osteopenia (T score femur -1.9, forearm -1.5)  . Breast surgery Left 06-01-13    Left breast "chip" implanted as marker(none malignant)  . Colonoscopy N/A 06/02/2013    Procedure: COLONOSCOPY;  Surgeon: Beverley Fiedler, MD;  Location: [facility];  Service: Gastroenterology;  Laterality: N/A;    Family History:  Family History  Problem Relation Age of Onset  . Heart attack Mother   . Aortic stenosis Mother   . Heart disease Mother     MI, aortic stenosis, pacer  . Parkinsonism Father   . Osteoporosis  Sister   . Arthritis Sister   . Breast cancer Maternal Aunt   . Stroke Neg Hx   . Drug abuse Neg Hx   . Depression Neg Hx   . Ovarian cancer Neg Hx   . Colon cancer Neg Hx     Social History:  reports that she has never smoked. She has never used smokeless tobacco. She reports that she does not drink alcohol or use illicit drugs.  Allergies:  Allergies  Allergen Reactions  . Adhesive [Tape] Other (See Comments)    Tears skin  . Amoxicillin     REACTION: RASH  . Clarithromycin     REACTION: GI UPSET  . Codeine Nausea Only and Palpitations    Medications:   Medication List    ASK your doctor about these medications       acetaminophen 500 MG tablet  Commonly known as:  TYLENOL  Take 500 mg by mouth every 6 (six) hours as needed for pain.     ALPRAZolam 0.25 MG tablet  Commonly known as:  XANAX  Take 0.125 mg by mouth 2 (two) times daily as needed for anxiety.     CLARITIN 10 MG tablet  Generic drug:  loratadine  Take 10 mg by  mouth daily.     glycerin (Pediatric) 1.2 G Supp  Place 1 suppository rectally daily as needed. constapation     hyoscyamine 0.125 MG SL tablet  Commonly known as:  LEVSIN SL  Place 0.125 mg under the tongue every 6 (six) hours as needed for cramping.     lidocaine-prilocaine cream  Commonly known as:  EMLA  Apply topically to port a cath site one hour prior to use. Do not rub in. Cover with plastic.     omeprazole 20 MG capsule  Commonly known as:  PRILOSEC  Take 20 mg by mouth 2 (two) times daily.     ondansetron 4 MG tablet  Commonly known as:  ZOFRAN  Take 4 mg by mouth every 6 (six) hours as needed for nausea.     polyethylene glycol packet  Commonly known as:  MIRALAX / GLYCOLAX  Take 17 g by mouth daily.     sennosides-docusate sodium 8.6-50 MG tablet  Commonly known as:  SENOKOT-S  Take 1 tablet by mouth 2 (two) times daily as needed for constipation.     traMADol 50 MG tablet  Commonly known as:  ULTRAM  Take 50-100  mg by mouth every 8 (eight) hours as needed for pain.     zolpidem 10 MG tablet  Commonly known as:  AMBIEN  Take 5-10 mg by mouth at bedtime.        Please HPI for pertinent positives, otherwise complete 10 system ROS negative.  Physical Exam: Temp: 98.7, HR: 110, BP: 136/72, RR: 18, O2: 97%   General Appearance:  Alert, cooperative, no distress, appears stated age  Head:  Normocephalic, without obvious abnormality, atraumatic  ENT: Unremarkable  Neck: Supple, symmetrical, trachea midline  Lungs:   Clear to auscultation bilaterally, no w/r/r  Chest Wall:  No tenderness or deformity  Heart:  Regular rate and rhythm, S1, S2 normal, no murmur, rub or gallop.  Abdomen:   Soft, non-tender, non distended.  Neurologic: Normal affect, no gross deficits.   Results for orders placed during the hospital encounter of 06/14/13 (from the past 48 hour(s))  APTT     Status: None   Collection Time    06/14/13  1:14 PM      Result Value Range   aPTT 28  24 - 37 seconds  CBC WITH DIFFERENTIAL     Status: Abnormal   Collection Time    06/14/13  1:14 PM      Result Value Range   WBC 9.9  4.0 - 10.5 K/uL   RBC 4.55  3.87 - 5.11 MIL/uL   Hemoglobin 12.2  12.0 - 15.0 g/dL   HCT 40.9  81.1 - 91.4 %   MCV 85.9  78.0 - 100.0 fL   MCH 26.8  26.0 - 34.0 pg   MCHC 31.2  30.0 - 36.0 g/dL   RDW 78.2  95.6 - 21.3 %   Platelets 430 (*) 150 - 400 K/uL   Neutrophils Relative % 73  43 - 77 %   Neutro Abs 7.2  1.7 - 7.7 K/uL   Lymphocytes Relative 15  12 - 46 %   Lymphs Abs 1.5  0.7 - 4.0 K/uL   Monocytes Relative 11  3 - 12 %   Monocytes Absolute 1.0  0.1 - 1.0 K/uL   Eosinophils Relative 1  0 - 5 %   Eosinophils Absolute 0.1  0.0 - 0.7 K/uL   Basophils Relative 0  0 - 1 %  Basophils Absolute 0.0  0.0 - 0.1 K/uL  PROTIME-INR     Status: None   Collection Time    06/14/13  1:14 PM      Result Value Range   Prothrombin Time 13.2  11.6 - 15.2 seconds   INR 1.02  0.00 - 1.49   No results  found.  Assessment/Plan Stage IV colon cancer Discussed portacath placement. Explained procedure, risks, complications, use of sedation. Labs reviewed, ok Consent signed in chart  Brayton El PA-C 06/14/2013, 2:06 PM

## 2013-06-14 NOTE — Procedures (Signed)
Successful placement of right IJ approach port-a-cath with tip at the superior caval atrial junction. The catheter is ready for immediate use. No immediate post procedural complications. 

## 2013-06-15 ENCOUNTER — Ambulatory Visit (HOSPITAL_BASED_OUTPATIENT_CLINIC_OR_DEPARTMENT_OTHER): Payer: Medicare Other

## 2013-06-15 ENCOUNTER — Ambulatory Visit: Payer: Medicare Other | Admitting: Nutrition

## 2013-06-15 ENCOUNTER — Other Ambulatory Visit (HOSPITAL_BASED_OUTPATIENT_CLINIC_OR_DEPARTMENT_OTHER): Payer: Medicare Other | Admitting: Lab

## 2013-06-15 VITALS — BP 135/53 | HR 90 | Temp 98.0°F

## 2013-06-15 DIAGNOSIS — C189 Malignant neoplasm of colon, unspecified: Secondary | ICD-10-CM

## 2013-06-15 DIAGNOSIS — C180 Malignant neoplasm of cecum: Secondary | ICD-10-CM

## 2013-06-15 DIAGNOSIS — Z5111 Encounter for antineoplastic chemotherapy: Secondary | ICD-10-CM

## 2013-06-15 LAB — COMPREHENSIVE METABOLIC PANEL (CC13)
ALT: 9 U/L (ref 0–55)
AST: 19 U/L (ref 5–34)
Alkaline Phosphatase: 86 U/L (ref 40–150)
BUN: 9 mg/dL (ref 7.0–26.0)
Calcium: 9.3 mg/dL (ref 8.4–10.4)
Glucose: 117 mg/dl (ref 70–140)
Sodium: 139 mEq/L (ref 136–145)
Total Bilirubin: 0.5 mg/dL (ref 0.20–1.20)
Total Protein: 7.1 g/dL (ref 6.4–8.3)

## 2013-06-15 LAB — CBC WITH DIFFERENTIAL/PLATELET
BASO%: 0.3 % (ref 0.0–2.0)
HCT: 38.5 % (ref 34.8–46.6)
LYMPH%: 15 % (ref 14.0–49.7)
MCHC: 31.7 g/dL (ref 31.5–36.0)
MCV: 85.2 fL (ref 79.5–101.0)
MONO#: 0.9 10*3/uL (ref 0.1–0.9)
MONO%: 9.6 % (ref 0.0–14.0)
NEUT%: 74.3 % (ref 38.4–76.8)
Platelets: 376 10*3/uL (ref 145–400)
RBC: 4.52 10*6/uL (ref 3.70–5.45)
WBC: 9.6 10*3/uL (ref 3.9–10.3)

## 2013-06-15 MED ORDER — SODIUM CHLORIDE 0.9 % IV SOLN
2400.0000 mg/m2 | INTRAVENOUS | Status: DC
  Administered 2013-06-15: 4500 mg via INTRAVENOUS
  Filled 2013-06-15: qty 90

## 2013-06-15 MED ORDER — ONDANSETRON 8 MG/50ML IVPB (CHCC)
8.0000 mg | Freq: Once | INTRAVENOUS | Status: AC
  Administered 2013-06-15: 8 mg via INTRAVENOUS

## 2013-06-15 MED ORDER — OXALIPLATIN CHEMO INJECTION 100 MG/20ML
85.0000 mg/m2 | Freq: Once | INTRAVENOUS | Status: AC
  Administered 2013-06-15: 160 mg via INTRAVENOUS
  Filled 2013-06-15: qty 32

## 2013-06-15 MED ORDER — LEUCOVORIN CALCIUM INJECTION 350 MG
400.0000 mg/m2 | Freq: Once | INTRAVENOUS | Status: AC
  Administered 2013-06-15: 752 mg via INTRAVENOUS
  Filled 2013-06-15: qty 37.6

## 2013-06-15 MED ORDER — ONDANSETRON 8 MG/NS 50 ML IVPB
INTRAVENOUS | Status: AC
  Filled 2013-06-15: qty 8

## 2013-06-15 MED ORDER — HEPARIN SOD (PORK) LOCK FLUSH 100 UNIT/ML IV SOLN
500.0000 [IU] | Freq: Once | INTRAVENOUS | Status: DC | PRN
  Filled 2013-06-15: qty 5

## 2013-06-15 MED ORDER — SODIUM CHLORIDE 0.9 % IJ SOLN
10.0000 mL | INTRAMUSCULAR | Status: DC | PRN
  Filled 2013-06-15: qty 10

## 2013-06-15 MED ORDER — FLUOROURACIL CHEMO INJECTION 2.5 GM/50ML
400.0000 mg/m2 | Freq: Once | INTRAVENOUS | Status: AC
  Administered 2013-06-15: 750 mg via INTRAVENOUS
  Filled 2013-06-15: qty 15

## 2013-06-15 MED ORDER — DEXAMETHASONE SODIUM PHOSPHATE 10 MG/ML IJ SOLN
INTRAMUSCULAR | Status: AC
  Filled 2013-06-15: qty 1

## 2013-06-15 MED ORDER — DEXTROSE 5 % IV SOLN
Freq: Once | INTRAVENOUS | Status: AC
  Administered 2013-06-15: 12:00:00 via INTRAVENOUS

## 2013-06-15 MED ORDER — DEXAMETHASONE SODIUM PHOSPHATE 10 MG/ML IJ SOLN
10.0000 mg | Freq: Once | INTRAMUSCULAR | Status: AC
  Administered 2013-06-15: 10 mg via INTRAVENOUS

## 2013-06-15 NOTE — Patient Instructions (Addendum)
[facility] Discharge Instructions for Patients Receiving Chemotherapy  Today you received the following chemotherapy agents 5 FU/Leucovorin/Oxaliplatin To help prevent nausea and vomiting after your treatment, we encourage you to take your nausea medication as prescribed. Take one Ondansetron this evening and then as prescribed for the next couple of days.  If you develop nausea and vomiting that is not controlled by your nausea medication, call the clinic.   BELOW ARE SYMPTOMS THAT SHOULD BE REPORTED IMMEDIATELY:  *FEVER GREATER THAN 100.5 F  *CHILLS WITH OR WITHOUT FEVER  NAUSEA AND VOMITING THAT IS NOT CONTROLLED WITH YOUR NAUSEA MEDICATION  *UNUSUAL SHORTNESS OF BREATH  *UNUSUAL BRUISING OR BLEEDING  TENDERNESS IN MOUTH AND THROAT WITH OR WITHOUT PRESENCE OF ULCERS  *URINARY PROBLEMS  *BOWEL PROBLEMS  UNUSUAL RASH Items with * indicate a potential emergency and should be followed up as soon as possible.  Feel free to call the clinic you have any questions or concerns. The clinic phone number is [phone].    Fluorouracil, 5-FU injection What is this medicine? FLUOROURACIL, 5-FU (flure oh YOOR a sil) is a chemotherapy drug. It slows the growth of cancer cells. This medicine is used to treat many types of cancer like breast cancer, colon or rectal cancer, pancreatic cancer, and stomach cancer. This medicine may be used for other purposes; ask your health care provider or pharmacist if you have questions. What should I tell my health care provider before I take this medicine? They need to know if you have any of these conditions: -blood disorders -dihydropyrimidine dehydrogenase (DPD) deficiency -infection (especially a virus infection such as chickenpox, cold sores, or herpes) -kidney disease -liver disease -malnourished, poor nutrition -recent or ongoing radiation therapy -an unusual or allergic reaction to fluorouracil, other chemotherapy, other  medicines, foods, dyes, or preservatives -pregnant or trying to get pregnant -breast-feeding How should I use this medicine? This drug is given as an infusion or injection into a vein. It is administered in a hospital or clinic by a specially trained health care professional. Talk to your pediatrician regarding the use of this medicine in children. Special care may be needed. Overdosage: If you think you have taken too much of this medicine contact a poison control center or emergency room at once. NOTE: This medicine is only for you. Do not share this medicine with others. What if I miss a dose? It is important not to miss your dose. Call your doctor or health care professional if you are unable to keep an appointment. What may interact with this medicine? -allopurinol -cimetidine -dapsone -digoxin -hydroxyurea -leucovorin -levamisole -medicines for seizures like ethotoin, fosphenytoin, phenytoin -medicines to increase blood counts like filgrastim, pegfilgrastim, sargramostim -medicines that treat or prevent blood clots like warfarin, enoxaparin, and dalteparin -methotrexate -metronidazole -pyrimethamine -some other chemotherapy drugs like busulfan, cisplatin, estramustine, vinblastine -trimethoprim -trimetrexate -vaccines Talk to your doctor or health care professional before taking any of these medicines: -acetaminophen -aspirin -ibuprofen -ketoprofen -naproxen This list may not describe all possible interactions. Give your health care provider a list of all the medicines, herbs, non-prescription drugs, or dietary supplements you use. Also tell them if you smoke, drink alcohol, or use illegal drugs. Some items may interact with your medicine. What should I watch for while using this medicine? Visit your doctor for checks on your progress. This drug may make you feel generally unwell. This is not uncommon, as chemotherapy can affect healthy cells as well as cancer cells. Report  any side effects.  Continue your course of treatment even though you feel ill unless your doctor tells you to stop. In some cases, you may be given additional medicines to help with side effects. Follow all directions for their use. Call your doctor or health care professional for advice if you get a fever, chills or sore throat, or other symptoms of a cold or flu. Do not treat yourself. This drug decreases your body's ability to fight infections. Try to avoid being around people who are sick. This medicine may increase your risk to bruise or bleed. Call your doctor or health care professional if you notice any unusual bleeding. Be careful brushing and flossing your teeth or using a toothpick because you may get an infection or bleed more easily. If you have any dental work done, tell your dentist you are receiving this medicine. Avoid taking products that contain aspirin, acetaminophen, ibuprofen, naproxen, or ketoprofen unless instructed by your doctor. These medicines may hide a fever. Do not become pregnant while taking this medicine. Women should inform their doctor if they wish to become pregnant or think they might be pregnant. There is a potential for serious side effects to an unborn child. Talk to your health care professional or pharmacist for more information. Do not breast-feed an infant while taking this medicine. Men should inform their doctor if they wish to father a child. This medicine may lower sperm counts. Do not treat diarrhea with over the counter products. Contact your doctor if you have diarrhea that lasts more than 2 days or if it is severe and watery. This medicine can make you more sensitive to the sun. Keep out of the sun. If you cannot avoid being in the sun, wear protective clothing and use sunscreen. Do not use sun lamps or tanning beds/booths. What side effects may I notice from receiving this medicine? Side effects that you should report to your doctor or health care  professional as soon as possible: -allergic reactions like skin rash, itching or hives, swelling of the face, lips, or tongue -low blood counts - this medicine may decrease the number of white blood cells, red blood cells and platelets. You may be at increased risk for infections and bleeding. -signs of infection - fever or chills, cough, sore throat, pain or difficulty passing urine -signs of decreased platelets or bleeding - bruising, pinpoint red spots on the skin, black, tarry stools, blood in the urine -signs of decreased red blood cells - unusually weak or tired, fainting spells, lightheadedness -breathing problems -changes in vision -chest pain -mouth sores -nausea and vomiting -pain, swelling, redness at site where injected -pain, tingling, numbness in the hands or feet -redness, swelling, or sores on hands or feet -stomach pain -unusual bleeding Side effects that usually do not require medical attention (report to your doctor or health care professional if they continue or are bothersome): -changes in finger or toe nails -diarrhea -dry or itchy skin -hair loss -headache -loss of appetite -sensitivity of eyes to the light -stomach upset -unusually teary eyes This list may not describe all possible side effects. Call your doctor for medical advice about side effects. You may report side effects to FDA at 1-800-FDA-1088. Where should I keep my medicine? This drug is given in a hospital or clinic and will not be stored at home. NOTE: This sheet is a summary. It may not cover all possible information. If you have questions about this medicine, talk to your doctor, pharmacist, or health care provider.  2013, [employer]  Standard. (12/22/2007 1:53:16 PM)    Leucovorin injection What is this medicine? LEUCOVORIN (loo koe VOR in) is used to prevent or treat the harmful effects of some medicines. This medicine is used to treat anemia caused by a low amount of folic acid in the  body. It is also used with 5-fluorouracil (5-FU) to treat colon cancer. This medicine may be used for other purposes; ask your health care provider or pharmacist if you have questions. What should I tell my health care provider before I take this medicine? They need to know if you have any of these conditions: -anemia from low levels of vitamin B-12 in the blood -an unusual or allergic reaction to leucovorin, folic acid, other medicines, foods, dyes, or preservatives -pregnant or trying to get pregnant -breast-feeding How should I use this medicine? This medicine is for injection into a muscle or into a vein. It is given by a health care professional in a hospital or clinic setting. Talk to your pediatrician regarding the use of this medicine in children. Special care may be needed. Overdosage: If you think you have taken too much of this medicine contact a poison control center or emergency room at once. NOTE: This medicine is only for you. Do not share this medicine with others. What if I miss a dose? This does not apply. What may interact with this medicine? -capecitabine -fluorouracil -phenobarbital -phenytoin -primidone -trimethoprim-sulfamethoxazole This list may not describe all possible interactions. Give your health care provider a list of all the medicines, herbs, non-prescription drugs, or dietary supplements you use. Also tell them if you smoke, drink alcohol, or use illegal drugs. Some items may interact with your medicine. What should I watch for while using this medicine? Your condition will be monitored carefully while you are receiving this medicine. This medicine may increase the side effects of 5-fluorouracil, 5-FU. Tell your doctor or health care professional if you have diarrhea or mouth sores that do not get better or that get worse. What side effects may I notice from receiving this medicine? Side effects that you should report to your doctor or health care  professional as soon as possible: -allergic reactions like skin rash, itching or hives, swelling of the face, lips, or tongue -breathing problems -fever, infection -mouth sores -unusual bleeding or bruising -unusually weak or tired Side effects that usually do not require medical attention (report to your doctor or health care professional if they continue or are bothersome): -constipation or diarrhea -loss of appetite -nausea, vomiting This list may not describe all possible side effects. Call your doctor for medical advice about side effects. You may report side effects to FDA at 1-800-FDA-1088. Where should I keep my medicine? This drug is given in a hospital or clinic and will not be stored at home. NOTE: This sheet is a summary. It may not cover all possible information. If you have questions about this medicine, talk to your doctor, pharmacist, or health care provider.  2013, [employer]vier/Gold Standard. (02/22/2008 4:50:29 PM)   Oxaliplatin Injection What is this medicine? OXALIPLATIN (ox AL i PLA tin) is a chemotherapy drug. It targets fast dividing cells, like cancer cells, and causes these cells to die. This medicine is used to treat cancers of the colon and rectum, and many other cancers. This medicine may be used for other purposes; ask your health care provider or pharmacist if you have questions. What should I tell my health care provider before I take this medicine? They need to know if  you have any of these conditions: -kidney disease -an unusual or allergic reaction to oxaliplatin, other chemotherapy, other medicines, foods, dyes, or preservatives -pregnant or trying to get pregnant -breast-feeding How should I use this medicine? This drug is given as an infusion into a vein. It is administered in a hospital or clinic by a specially trained health care professional. Talk to your pediatrician regarding the use of this medicine in children. Special care may be  needed. Overdosage: If you think you have taken too much of this medicine contact a poison control center or emergency room at once. NOTE: This medicine is only for you. Do not share this medicine with others. What if I miss a dose? It is important not to miss a dose. Call your doctor or health care professional if you are unable to keep an appointment. What may interact with this medicine? -medicines to increase blood counts like filgrastim, pegfilgrastim, sargramostim -probenecid -some antibiotics like amikacin, gentamicin, neomycin, polymyxin B, streptomycin, tobramycin -zalcitabine Talk to your doctor or health care professional before taking any of these medicines: -acetaminophen -aspirin -ibuprofen -ketoprofen -naproxen This list may not describe all possible interactions. Give your health care provider a list of all the medicines, herbs, non-prescription drugs, or dietary supplements you use. Also tell them if you smoke, drink alcohol, or use illegal drugs. Some items may interact with your medicine. What should I watch for while using this medicine? Your condition will be monitored carefully while you are receiving this medicine. You will need important blood work done while you are taking this medicine. This medicine can make you more sensitive to cold. Do not drink cold drinks or use ice. Cover exposed skin before coming in contact with cold temperatures or cold objects. When out in cold weather wear warm clothing and cover your mouth and nose to warm the air that goes into your lungs. Tell your doctor if you get sensitive to the cold. This drug may make you feel generally unwell. This is not uncommon, as chemotherapy can affect healthy cells as well as cancer cells. Report any side effects. Continue your course of treatment even though you feel ill unless your doctor tells you to stop. In some cases, you may be given additional medicines to help with side effects. Follow all  directions for their use. Call your doctor or health care professional for advice if you get a fever, chills or sore throat, or other symptoms of a cold or flu. Do not treat yourself. This drug decreases your body's ability to fight infections. Try to avoid being around people who are sick. This medicine may increase your risk to bruise or bleed. Call your doctor or health care professional if you notice any unusual bleeding. Be careful brushing and flossing your teeth or using a toothpick because you may get an infection or bleed more easily. If you have any dental work done, tell your dentist you are receiving this medicine. Avoid taking products that contain aspirin, acetaminophen, ibuprofen, naproxen, or ketoprofen unless instructed by your doctor. These medicines may hide a fever. Do not become pregnant while taking this medicine. Women should inform their doctor if they wish to become pregnant or think they might be pregnant. There is a potential for serious side effects to an unborn child. Talk to your health care professional or pharmacist for more information. Do not breast-feed an infant while taking this medicine. Call your doctor or health care professional if you get diarrhea. Do not treat yourself. What  side effects may I notice from receiving this medicine? Side effects that you should report to your doctor or health care professional as soon as possible: -allergic reactions like skin rash, itching or hives, swelling of the face, lips, or tongue -low blood counts - This drug may decrease the number of white blood cells, red blood cells and platelets. You may be at increased risk for infections and bleeding. -signs of infection - fever or chills, cough, sore throat, pain or difficulty passing urine -signs of decreased platelets or bleeding - bruising, pinpoint red spots on the skin, black, tarry stools, nosebleeds -signs of decreased red blood cells - unusually weak or tired, fainting  spells, lightheadedness -breathing problems -chest pain, pressure -cough -diarrhea -jaw tightness -mouth sores -nausea and vomiting -pain, swelling, redness or irritation at the injection site -pain, tingling, numbness in the hands or feet -problems with balance, talking, walking -redness, blistering, peeling or loosening of the skin, including inside the mouth -trouble passing urine or change in the amount of urine Side effects that usually do not require medical attention (report to your doctor or health care professional if they continue or are bothersome): -changes in vision -constipation -hair loss -loss of appetite -metallic taste in the mouth or changes in taste -stomach pain This list may not describe all possible side effects. Call your doctor for medical advice about side effects. You may report side effects to FDA at 1-800-FDA-1088. Where should I keep my medicine? This drug is given in a hospital or clinic and will not be stored at home. NOTE: This sheet is a summary. It may not cover all possible information. If you have questions about this medicine, talk to your doctor, pharmacist, or health care provider.  2013, Elsevier/Gold Standard. (03/14/2008 5:22:47 PM)

## 2013-06-15 NOTE — Progress Notes (Signed)
Patient is struggling with increasing oral intake and fluid intake.  She generally eats a good breakfast but finds it difficult to eat later in the day.  She is still afraid to eat and fears the pain of eating.  She describes some taste alterations. I question if patient has a bit of nausea, especially later in the day.  Nutrition Diagnosis:  Unintended weight loss cannot be evaluated. New nutrition diagnosis:  Inadequate oral intake related to poor appetite as evidenced by patient's dietary recall.  Intervention:  Educated patient on strategies for increased intake.  I provided strategies on altering the taste of foods for increased tolerance.  We discussed specific snacks for her to try.  I educated her on importance of taking medications as prescribed.  I have encouraged her to try some oral nutrition supplements which I provided at the last visit.  Patient continues to be resistant to trying new foods.  Fact sheets provided.  Questions answered.  Teach back method used.  Monitoring, evaluation, goals:  Patient will tolerate increase oral intake and fluid intake to minimize weight loss.  Next visit:  Wednesday, October 29, during chemotherapy.

## 2013-06-16 ENCOUNTER — Encounter: Payer: Self-pay | Admitting: *Deleted

## 2013-06-16 NOTE — Progress Notes (Signed)
[facility] Clinical Social Work  Clinical Social Work was referred by patient navigator for assessment of psychosocial needs.  Clinical Social Worker met with patient and her husband, Kathlene November in the [facility] infusion room to offer support and assess for needs.  Pt and husband described the "whirlwind" they had been on since she was diagnosed with colon cancer. CSW provided supportive listening and attempted to "normalize" the emotions they shared. Pt reports difficulty thinking and focusing, which is very common in this situation. CSW discussed ways to cope and resources to assist at [facility]. CSW provided them with a support program calendar and suggested them to reach out to CSW as needed.  They were appreciative of the visit and support.    Doreen Salvage, LCSW Clinical Social Worker [facility] for Patient & Family Support [facility] Wednesday, Thursday and Friday Phone: [phone] Fax: [phone]

## 2013-06-17 ENCOUNTER — Ambulatory Visit (HOSPITAL_BASED_OUTPATIENT_CLINIC_OR_DEPARTMENT_OTHER): Payer: Medicare Other

## 2013-06-17 ENCOUNTER — Telehealth: Payer: Self-pay | Admitting: *Deleted

## 2013-06-17 VITALS — BP 140/62 | HR 103 | Temp 98.6°F

## 2013-06-17 DIAGNOSIS — C189 Malignant neoplasm of colon, unspecified: Secondary | ICD-10-CM

## 2013-06-17 DIAGNOSIS — C180 Malignant neoplasm of cecum: Secondary | ICD-10-CM

## 2013-06-17 DIAGNOSIS — Z452 Encounter for adjustment and management of vascular access device: Secondary | ICD-10-CM

## 2013-06-17 DIAGNOSIS — C772 Secondary and unspecified malignant neoplasm of intra-abdominal lymph nodes: Secondary | ICD-10-CM

## 2013-06-17 MED ORDER — SODIUM CHLORIDE 0.9 % IJ SOLN
10.0000 mL | INTRAMUSCULAR | Status: DC | PRN
  Administered 2013-06-17: 10 mL
  Filled 2013-06-17: qty 10

## 2013-06-17 MED ORDER — ONDANSETRON HCL 4 MG PO TABS: 4.0000 mg | ORAL_TABLET | Freq: Four times a day (QID) | ORAL | Status: DC | PRN

## 2013-06-17 MED ORDER — HEPARIN SOD (PORK) LOCK FLUSH 100 UNIT/ML IV SOLN
500.0000 [IU] | Freq: Once | INTRAVENOUS | Status: AC | PRN
  Administered 2013-06-17: 500 [IU]
  Filled 2013-06-17: qty 5

## 2013-06-17 NOTE — Telephone Encounter (Signed)
Asking for Dr. Truett Perna to renew her Zofran 4 mg tablet that was originally ordered by Dr. Rhea Belton.

## 2013-06-17 NOTE — Telephone Encounter (Signed)
erroneous

## 2013-06-17 NOTE — Telephone Encounter (Signed)
Patient states that she is eating and drinking okay. Denies any nausea or vomiting. States that she is taking her nausea medicine. She has phone number to call for any questions or concerns.

## 2013-06-20 ENCOUNTER — Telehealth: Payer: Self-pay | Admitting: *Deleted

## 2013-06-20 ENCOUNTER — Other Ambulatory Visit: Payer: Self-pay | Admitting: Urology

## 2013-06-20 ENCOUNTER — Other Ambulatory Visit (HOSPITAL_COMMUNITY)
Admission: RE | Admit: 2013-06-20 | Discharge: 2013-06-20 | Disposition: A | Discharge status: Home / Self Care | Payer: Medicare Other | Source: Ambulatory Visit | Attending: Oncology | Admitting: Oncology

## 2013-06-20 DIAGNOSIS — C180 Malignant neoplasm of cecum: Secondary | ICD-10-CM | POA: Insufficient documentation

## 2013-06-20 NOTE — Telephone Encounter (Signed)
Daughter called with concerns over patient's postchemo volume status/nutrition. She is having trouble eating-due to cramping and hyperactivity of her bowels when she eats. Expresses concern with her poor caloric intake.

## 2013-06-21 ENCOUNTER — Telehealth: Payer: Self-pay | Admitting: *Deleted

## 2013-06-21 NOTE — Telephone Encounter (Signed)
Call from pt's daughter reporting she is eating only about 300-400 calories/ day. She reports pt feels full quickly and has cramping pain after eating. Idalia Needle reports she contacted her friend who is an Best boy and he recommended "Donnatol" Pt is not taking any supplements, due to taste. Recommended she introduce protein supplement/ can be added to savory foods. She reports pt seems depressed, not interacting with family and friends. Is an avid reader and enjoys cooking. Has not been interested in any of these things.   Called pt, she denies feelings of hopelessness. States she is still getting acclimated to her diagnosis. Does not feel up to taking calls or visitors. States she had been fatigued after chemo, was able to stay out of bed longer on 10/20. Eats small breakfast and small lunch, unable to tolerate much for dinner. Reports dysgeusia.   Reviewed above with Dr. Truett Perna: Anorexia is likely related to the cancer. Should get better after a few cycles. Will address at next office visit. Pt voiced understanding. Encouraged her to call office if she notices changes in mood or if no improvement in appetite this week. She voiced understanding.

## 2013-06-26 ENCOUNTER — Other Ambulatory Visit: Payer: Self-pay | Admitting: Oncology

## 2013-06-27 ENCOUNTER — Inpatient Hospital Stay: Payer: Medicare Other

## 2013-06-28 ENCOUNTER — Other Ambulatory Visit: Payer: Self-pay | Admitting: *Deleted

## 2013-06-28 DIAGNOSIS — C189 Malignant neoplasm of colon, unspecified: Secondary | ICD-10-CM

## 2013-06-29 ENCOUNTER — Other Ambulatory Visit (HOSPITAL_BASED_OUTPATIENT_CLINIC_OR_DEPARTMENT_OTHER): Payer: Medicare Other | Admitting: Lab

## 2013-06-29 ENCOUNTER — Telehealth: Payer: Self-pay | Admitting: *Deleted

## 2013-06-29 ENCOUNTER — Other Ambulatory Visit: Payer: Self-pay | Admitting: Oncology

## 2013-06-29 ENCOUNTER — Ambulatory Visit (HOSPITAL_BASED_OUTPATIENT_CLINIC_OR_DEPARTMENT_OTHER): Payer: Medicare Other

## 2013-06-29 ENCOUNTER — Ambulatory Visit: Payer: Medicare Other | Admitting: Nutrition

## 2013-06-29 ENCOUNTER — Telehealth: Payer: Self-pay | Admitting: Oncology

## 2013-06-29 ENCOUNTER — Encounter: Payer: Self-pay | Admitting: *Deleted

## 2013-06-29 ENCOUNTER — Ambulatory Visit (HOSPITAL_BASED_OUTPATIENT_CLINIC_OR_DEPARTMENT_OTHER): Payer: Medicare Other | Admitting: Oncology

## 2013-06-29 VITALS — BP 138/82 | HR 101 | Temp 99.3°F

## 2013-06-29 VITALS — BP 132/71 | HR 97 | Temp 97.8°F | Resp 20 | Ht 65.0 in | Wt 163.2 lb

## 2013-06-29 DIAGNOSIS — C180 Malignant neoplasm of cecum: Secondary | ICD-10-CM

## 2013-06-29 DIAGNOSIS — C787 Secondary malignant neoplasm of liver and intrahepatic bile duct: Secondary | ICD-10-CM

## 2013-06-29 DIAGNOSIS — C189 Malignant neoplasm of colon, unspecified: Secondary | ICD-10-CM

## 2013-06-29 DIAGNOSIS — C772 Secondary and unspecified malignant neoplasm of intra-abdominal lymph nodes: Secondary | ICD-10-CM

## 2013-06-29 DIAGNOSIS — E875 Hyperkalemia: Secondary | ICD-10-CM

## 2013-06-29 DIAGNOSIS — F341 Dysthymic disorder: Secondary | ICD-10-CM

## 2013-06-29 DIAGNOSIS — Z5112 Encounter for antineoplastic immunotherapy: Secondary | ICD-10-CM

## 2013-06-29 DIAGNOSIS — Z5111 Encounter for antineoplastic chemotherapy: Secondary | ICD-10-CM

## 2013-06-29 LAB — CBC WITH DIFFERENTIAL/PLATELET
BASO%: 0.6 % (ref 0.0–2.0)
EOS%: 2.9 % (ref 0.0–7.0)
HCT: 36.7 % (ref 34.8–46.6)
HGB: 11.5 g/dL — ABNORMAL LOW (ref 11.6–15.9)
MCH: 26.6 pg (ref 25.1–34.0)
MCHC: 31.3 g/dL — ABNORMAL LOW (ref 31.5–36.0)
MCV: 85 fL (ref 79.5–101.0)
MONO%: 18.7 % — ABNORMAL HIGH (ref 0.0–14.0)
NEUT%: 51.7 % (ref 38.4–76.8)
Platelets: 262 10*3/uL (ref 145–400)
RBC: 4.32 10*6/uL (ref 3.70–5.45)
RDW: 14 % (ref 11.2–14.5)
lymph#: 0.9 10*3/uL (ref 0.9–3.3)

## 2013-06-29 LAB — COMPREHENSIVE METABOLIC PANEL (CC13)
ALT: 14 U/L (ref 0–55)
AST: 22 U/L (ref 5–34)
Albumin: 3.1 g/dL — ABNORMAL LOW (ref 3.5–5.0)
Alkaline Phosphatase: 79 U/L (ref 40–150)
BUN: 5.1 mg/dL — ABNORMAL LOW (ref 7.0–26.0)
CO2: 27 mEq/L (ref 22–29)
Potassium: 3 mEq/L — CL (ref 3.5–5.1)

## 2013-06-29 MED ORDER — OXALIPLATIN CHEMO INJECTION 100 MG/20ML
85.0000 mg/m2 | Freq: Once | INTRAVENOUS | Status: AC
  Administered 2013-06-29: 160 mg via INTRAVENOUS
  Filled 2013-06-29: qty 32

## 2013-06-29 MED ORDER — FLUOROURACIL CHEMO INJECTION 2.5 GM/50ML
400.0000 mg/m2 | Freq: Once | INTRAVENOUS | Status: AC
  Administered 2013-06-29: 750 mg via INTRAVENOUS
  Filled 2013-06-29: qty 15

## 2013-06-29 MED ORDER — POTASSIUM CHLORIDE CRYS ER 20 MEQ PO TBCR: 20.0000 meq | EXTENDED_RELEASE_TABLET | Freq: Every day | ORAL | Status: DC

## 2013-06-29 MED ORDER — SODIUM CHLORIDE 0.9 % IV SOLN
2400.0000 mg/m2 | INTRAVENOUS | Status: DC
  Administered 2013-06-29: 4500 mg via INTRAVENOUS
  Filled 2013-06-29: qty 90

## 2013-06-29 MED ORDER — MIRTAZAPINE 15 MG PO TABS: 15.0000 mg | ORAL_TABLET | Freq: Every day | ORAL | Status: DC

## 2013-06-29 MED ORDER — SODIUM CHLORIDE 0.9 % IV SOLN
Freq: Once | INTRAVENOUS | Status: AC
  Administered 2013-06-29: 11:00:00 via INTRAVENOUS

## 2013-06-29 MED ORDER — ONDANSETRON HCL 4 MG PO TABS: 4.0000 mg | ORAL_TABLET | Freq: Four times a day (QID) | ORAL | Status: DC | PRN

## 2013-06-29 MED ORDER — ALPRAZOLAM 0.25 MG PO TABS: 0.1250 mg | ORAL_TABLET | Freq: Two times a day (BID) | ORAL | Status: DC | PRN

## 2013-06-29 MED ORDER — DEXAMETHASONE SODIUM PHOSPHATE 10 MG/ML IJ SOLN
10.0000 mg | Freq: Once | INTRAMUSCULAR | Status: AC
  Administered 2013-06-29: 10 mg via INTRAVENOUS

## 2013-06-29 MED ORDER — SODIUM CHLORIDE 0.9 % IV SOLN
5.0000 mg/kg | Freq: Once | INTRAVENOUS | Status: AC
  Administered 2013-06-29: 375 mg via INTRAVENOUS
  Filled 2013-06-29: qty 15

## 2013-06-29 MED ORDER — ONDANSETRON 8 MG/NS 50 ML IVPB
INTRAVENOUS | Status: AC
  Filled 2013-06-29: qty 8

## 2013-06-29 MED ORDER — ONDANSETRON 8 MG/50ML IVPB (CHCC)
8.0000 mg | Freq: Once | INTRAVENOUS | Status: AC
  Administered 2013-06-29: 8 mg via INTRAVENOUS

## 2013-06-29 MED ORDER — LEUCOVORIN CALCIUM INJECTION 350 MG
400.0000 mg/m2 | Freq: Once | INTRAVENOUS | Status: AC
  Administered 2013-06-29: 752 mg via INTRAVENOUS
  Filled 2013-06-29: qty 37.6

## 2013-06-29 MED ORDER — DEXTROSE 5 % IV SOLN
Freq: Once | INTRAVENOUS | Status: AC
  Administered 2013-06-29: 10:00:00 via INTRAVENOUS

## 2013-06-29 MED ORDER — DEXAMETHASONE SODIUM PHOSPHATE 10 MG/ML IJ SOLN
INTRAMUSCULAR | Status: AC
  Filled 2013-06-29: qty 1

## 2013-06-29 NOTE — Telephone Encounter (Signed)
Received phone call from pathology, Linus Mako, stating that per Dr. Frederica Kuster, not enough tissue for MSI testing, will  Need to obtain 1 purple top tube of blood for the testing.  This RN notified MD.  Will need to obtain this at next visit.

## 2013-06-29 NOTE — Progress Notes (Signed)
Weight loss and poor appetite- will see nutritionist today in treatment area. Also admits to depression and feelings of loss of control and became tearful during conversation. Is receptive to talking with social worker today to access if seeing psychologist/counselor would be of benefit. Is not suicidal. Suggested talking with social worker today without her family present so she would be more willing to open up and she and family agrees.

## 2013-06-29 NOTE — Patient Instructions (Addendum)
[facility] Discharge Instructions for Patients Receiving Chemotherapy  Today you received the following chemotherapy agents:  Oxaliplatin,Leucovorin and 5FU.  To help prevent nausea and vomiting after your treatment, we encourage you to take your nausea medication as ordered per MD.   If you develop nausea and vomiting that is not controlled by your nausea medication, call the clinic.   BELOW ARE SYMPTOMS THAT SHOULD BE REPORTED IMMEDIATELY:  *FEVER GREATER THAN 100.5 F  *CHILLS WITH OR WITHOUT FEVER  NAUSEA AND VOMITING THAT IS NOT CONTROLLED WITH YOUR NAUSEA MEDICATION  *UNUSUAL SHORTNESS OF BREATH  *UNUSUAL BRUISING OR BLEEDING  TENDERNESS IN MOUTH AND THROAT WITH OR WITHOUT PRESENCE OF ULCERS  *URINARY PROBLEMS  *BOWEL PROBLEMS  UNUSUAL RASH Items with * indicate a potential emergency and should be followed up as soon as possible.  Your K+ level is low at 3.0-you need to begin taking KCl 20 meq by mouth every day (today take one twice/day), then one by mouth daily. The script has been sent to your [facility] pharmacy.  Feel free to call the clinic you have any questions or concerns. The clinic phone number is [phone].

## 2013-06-29 NOTE — Progress Notes (Signed)
[facility] Clinical Social Work  Clinical Social Work was referred by patient navigator for assessment of psychosocial needs.  Clinical Social Worker met with patient at [facility] in the infusion room to offer support and assess for needs. Pt reports continued emotional concerns and difficulty coping with her cancer treatment.  Pt had to use the restroom and CSW left her with additional information. CSW suggested to Pt that it may be helpful to meet in CSW office for additional discussion and support. Pt agrees to consider. CSW will phone Pt at home in the am to reassess concerns.    Clinical Social Work interventions: CSW provided supportive listening, problem solving and resource and referral.   Doreen Salvage, LCSW Clinical Social Worker [facility] for Patient & Family Support [facility] Wednesday, Thursday and Friday Phone: [phone] Fax: [phone]

## 2013-06-29 NOTE — Progress Notes (Signed)
Met with patient and daughter.  Patient stated she was ready to talk with SW or possibly a Veterinary surgeon.  This RN left message with SW Cammy Copa and Remonia Richter requesting to either see or contact the patient.  Dietician to see patient today while in infusion to address her needs re: poor intake.   Re: Accession: [id number], Dr. Truett Perna is requesting that the preservation of protein expression be correlated with molecular based MSI testing.  Spoke with Linus Mako in pathology with request.   No other barriers to care identified.  Will continue to follow as needed.

## 2013-06-29 NOTE — Telephone Encounter (Signed)
gv and printed appt sched and avs for pt for OCT thru Dec...MW added tx

## 2013-06-29 NOTE — Progress Notes (Signed)
   [facility]    OFFICE PROGRESS NOTE   INTERVAL HISTORY:   She completed a first cycle of FOLFOX on 06/15/2013. No nausea or vomiting following chemotherapy. She had 3-4 days of small volume diarrhea. This has resolved. No mouth sores or neuropathy symptoms. She continues to have anorexia. She also has insomnia. She is staying home most of the time.  Objective:  Vital signs in last 24 hours:  Blood pressure 132/71, pulse 97, temperature 97.8 F (36.6 C), temperature source Oral, resp. rate 20, height 5\' 5"  (1.651 m), weight 163 lb 3.2 oz (74.027 kg).    HEENT: No thrush or ulcers Resp: Lungs clear bilaterally Cardio: Regular rate and rhythm GI: Mild fullness in the right upper abdomen without a discrete liver edge. The abdomen is mildly distended. Nontender. Firm fullness in the right lower abdomen. Vascular: No leg edema  Portacath/PICC-without erythema  Lab Results:  Lab Results  Component Value Date   WBC 3.5* 06/29/2013   HGB 11.5* 06/29/2013   HCT 36.7 06/29/2013   MCV 85.0 06/29/2013   PLT 262 06/29/2013   ANC 1.8    Medications: I have reviewed the patient's current medications.  Assessment/Plan: 1. Stage IV colon cancer, colonoscopy 06/02/2013 found a mass at the cecum with a biopsy confirming adenocarcinoma positive K-ras mutation, no loss of expression of mismatch repair proteins  Cycle 1 FOLFOX 06/15/2013 2. 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 Acute nausea/vomiting 06/08/2013-? Related to partial obstruction-improved  4. History of an arrhythmia 5. Depression 6. Anorexia 7. Port-A-Cath placement 06/14/2013    Disposition:  She completed a first cycle of FOLFOX on 06/15/2013. She tolerated the chemotherapy without significant acute toxicity. The plan is to proceed with cycle 2 today. Avastin will be added with this cycle.  The anorexia is likely related to the metastatic tumor burden. Depression may  also be contributing. We decided to begin a trial of Remeron. If this does not help within the next few weeks she will start Megace.  Ms. Arechiga will return for an office visit and chemotherapy in 2 weeks. She will contact us in the interim for new symptoms. I encouraged her to start getting out of the home.   Thornton Papas, MD  06/29/2013  6:11 PM

## 2013-06-29 NOTE — Telephone Encounter (Signed)
Per staff message and POF I have scheduled appts.  JMW  

## 2013-06-29 NOTE — Progress Notes (Signed)
1400-Pt ready for discharge and ambulated to the bathroom.  On arrival back from the bathroom, she began to complain of tingling to fingers and "swimming feeling in head".  Pt.'s voice is hoarse when speaking.  She does deny any SOB, or tightness in her chest or throat.  Dr. Truett Perna notified and order received to keep patient for observation for 30 minutes.  1430-Pt states that she is feeling better and than tingling has resolved.  Pt states that she feels she is ready to go home.  Pt discharged to home via w/c with husband at her side.  Pt instructed to call [facility] with any worsening symptoms.  Pt and pt.'s husband verbalize an understanding of information.

## 2013-06-29 NOTE — Progress Notes (Signed)
Patient continues to have weight loss.  Weight decreased to 163.2 pounds October 29 from 169.1 pounds October 9.  Patient following a low residue, low fiber diet.  Continues to struggle with increasing oral intake in the afternoons and evenings.  She reports her nausea medication creates dry mouth which in turn decreases oral intake.  Husband expresses frustration with patient's decreased oral intake.  Nutrition diagnosis: Unintended weight loss continues.  Diagnosis of inadequate oral intake also continues.  Intervention: I have encouraged patient to work to increase oral intake in the mornings when she feels well.  She is to take nausea medication as needed for nausea.  If she continues to experience negative side effects, she is to contact her physician for evaluation for a different nausea medicine.  Patient educated to drink resource breeze one daily in the afternoons.  She also was educated to explore options such as protein bars and easy to swallow foods later in the day. I spent a great deal of time encouraging patient to attempt "bites and sips" throughout the day.  Monitoring, evaluation, goals: Patient will work to increase oral intake to minimize further weight loss.  Next visit: Wednesday, November 12th, during chemotherapy.

## 2013-06-30 ENCOUNTER — Telehealth: Payer: Self-pay | Admitting: *Deleted

## 2013-06-30 ENCOUNTER — Encounter: Payer: Self-pay | Admitting: *Deleted

## 2013-06-30 NOTE — Telephone Encounter (Signed)
Call from pt's daughter reporting 5FU continuous infusion pump has an error message that reads "high pressure". She has turned it off, unable to get it started again. Troubleshooting complete. She reports pump is running again, instructed her to call for any issues.

## 2013-06-30 NOTE — Progress Notes (Signed)
[facility] Clinical Social Work  Clinical Social Work left message to check in on Pt from meeting on 06/29/13. CSW will continue to support Pt and family as they adjust to current illness. CSW will continue to try to reach out to them as needed.   Doreen Salvage, LCSW Clinical Social Worker [facility] for Patient & Family Support [facility] Wednesday, Thursday and Friday Phone: [phone] Fax: [phone]

## 2013-07-01 ENCOUNTER — Ambulatory Visit (HOSPITAL_BASED_OUTPATIENT_CLINIC_OR_DEPARTMENT_OTHER): Payer: Medicare Other

## 2013-07-01 DIAGNOSIS — C787 Secondary malignant neoplasm of liver and intrahepatic bile duct: Secondary | ICD-10-CM

## 2013-07-01 DIAGNOSIS — C189 Malignant neoplasm of colon, unspecified: Secondary | ICD-10-CM

## 2013-07-01 DIAGNOSIS — Z452 Encounter for adjustment and management of vascular access device: Secondary | ICD-10-CM

## 2013-07-01 DIAGNOSIS — C180 Malignant neoplasm of cecum: Secondary | ICD-10-CM

## 2013-07-01 MED ORDER — HEPARIN SOD (PORK) LOCK FLUSH 100 UNIT/ML IV SOLN
500.0000 [IU] | Freq: Once | INTRAVENOUS | Status: AC | PRN
  Administered 2013-07-01: 500 [IU]
  Filled 2013-07-01: qty 5

## 2013-07-01 MED ORDER — SODIUM CHLORIDE 0.9 % IJ SOLN
10.0000 mL | INTRAMUSCULAR | Status: DC | PRN
  Administered 2013-07-01: 10 mL
  Filled 2013-07-01: qty 10

## 2013-07-01 NOTE — Progress Notes (Signed)
2.3 ml bolused

## 2013-07-06 ENCOUNTER — Telehealth: Payer: Self-pay | Admitting: *Deleted

## 2013-07-06 NOTE — Telephone Encounter (Addendum)
Call from pt reporting with her first chemo infusion she had back pain. Second infusion was given in a bed and she was comfortable the entire time. Did not have to take pain med when she got home. Pt is requesting her treatments to be scheduled in the bed. Pt made aware that it may not be possible to do this each time. Request forwarded to chemo scheduler.

## 2013-07-10 ENCOUNTER — Other Ambulatory Visit: Payer: Self-pay | Admitting: Oncology

## 2013-07-13 ENCOUNTER — Ambulatory Visit (HOSPITAL_BASED_OUTPATIENT_CLINIC_OR_DEPARTMENT_OTHER): Payer: Medicare Other | Admitting: Oncology

## 2013-07-13 ENCOUNTER — Ambulatory Visit: Payer: Medicare Other

## 2013-07-13 ENCOUNTER — Other Ambulatory Visit (HOSPITAL_BASED_OUTPATIENT_CLINIC_OR_DEPARTMENT_OTHER): Payer: Medicare Other | Admitting: Lab

## 2013-07-13 ENCOUNTER — Encounter: Payer: Medicare Other | Admitting: Nutrition

## 2013-07-13 ENCOUNTER — Telehealth: Payer: Self-pay | Admitting: Oncology

## 2013-07-13 ENCOUNTER — Other Ambulatory Visit: Payer: Self-pay | Admitting: *Deleted

## 2013-07-13 VITALS — BP 140/76 | HR 98 | Temp 98.2°F | Resp 18 | Ht 65.0 in | Wt 161.8 lb

## 2013-07-13 DIAGNOSIS — C189 Malignant neoplasm of colon, unspecified: Secondary | ICD-10-CM

## 2013-07-13 DIAGNOSIS — C787 Secondary malignant neoplasm of liver and intrahepatic bile duct: Secondary | ICD-10-CM

## 2013-07-13 DIAGNOSIS — D702 Other drug-induced agranulocytosis: Secondary | ICD-10-CM

## 2013-07-13 DIAGNOSIS — R109 Unspecified abdominal pain: Secondary | ICD-10-CM

## 2013-07-13 DIAGNOSIS — C180 Malignant neoplasm of cecum: Secondary | ICD-10-CM

## 2013-07-13 DIAGNOSIS — F341 Dysthymic disorder: Secondary | ICD-10-CM

## 2013-07-13 LAB — CBC WITH DIFFERENTIAL/PLATELET
BASO%: 1.4 % (ref 0.0–2.0)
EOS%: 2.1 % (ref 0.0–7.0)
Eosinophils Absolute: 0.1 10*3/uL (ref 0.0–0.5)
HCT: 35.1 % (ref 34.8–46.6)
HGB: 10.9 g/dL — ABNORMAL LOW (ref 11.6–15.9)
MCH: 26.5 pg (ref 25.1–34.0)
MCHC: 31.1 g/dL — ABNORMAL LOW (ref 31.5–36.0)
MONO#: 0.5 10*3/uL (ref 0.1–0.9)
NEUT#: 0.7 10*3/uL — ABNORMAL LOW (ref 1.5–6.5)
NEUT%: 23.9 % — ABNORMAL LOW (ref 38.4–76.8)
RBC: 4.12 10*6/uL (ref 3.70–5.45)
WBC: 2.9 10*3/uL — ABNORMAL LOW (ref 3.9–10.3)
lymph#: 1.6 10*3/uL (ref 0.9–3.3)

## 2013-07-13 LAB — UA PROTEIN, DIPSTICK - CHCC: Protein, ur: <30 mg/dL

## 2013-07-13 NOTE — Telephone Encounter (Signed)
gv and printed appt sched and avs for pt for NOV adn DEC....MW added tx.

## 2013-07-13 NOTE — Progress Notes (Signed)
Met with patient and daughter.  Patient will not be treated today due to low white count.  Patient denied any symptoms of infection.  Patient was reminded of signs/symptoms to report such as fever, chills, cough, etc.  Patient is aware of infection prevention practices such as proper hand hygiene, etc.  Request sent to scheduler to move appointment with dietician to 07/20/13.  Patient denied any needs at present time.  Will continue to follow as needed.

## 2013-07-13 NOTE — Progress Notes (Addendum)
   [facility]    OFFICE PROGRESS NOTE   INTERVAL HISTORY:   She completed a second cycle of FOLFOX on 06/29/2013. No nausea/vomiting, mouth sores, or diarrhea following chemotherapy. She had cold sensitivity following chemotherapy. No neuropathy symptoms today.  The abdominal fullness has improved. She has an improved appetite and energy level.  Objective:  Vital signs in last 24 hours:  Blood pressure 140/76, pulse 98, temperature 98.2 F (36.8 C), temperature source Oral, resp. rate 18, height 5\' 5"  (1.651 m), weight 161 lb 12.8 oz (73.392 kg), SpO2 98.00%.    HEENT: no thrush or ulcer Resp: lungs clear bilaterally Cardio: regular rate and rhythm GI: no hepatomegaly, nontender, no mass Vascular: no leg edema  Skin:palms without erythema   Portacath/PICC-without erythema  Lab Results:  Lab Results  Component Value Date   WBC 2.9* 07/13/2013   HGB 10.9* 07/13/2013   HCT 35.1 07/13/2013   MCV 85.2 07/13/2013   PLT 166 07/13/2013  ANC 0.7    Medications: I have reviewed the patient's current medications.  Assessment/Plan: 1. Stage IV colon cancer, colonoscopy 06/02/2013 found a mass at the cecum with a biopsy confirming adenocarcinoma positive K-ras mutation, no loss of expression of mismatch repair proteins, microsatellite stable Cycle 1 FOLFOX 06/15/2013 Cycle 2 FOLFOX/Avastin 06/29/2013 2. 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 Acute nausea/vomiting 06/08/2013-? Related to partial obstruction-improved  4. History of an arrhythmia  5. Depression -now taking Remeron, improved 6. Anorexia -improved 7. Port-A-Cath placement 06/14/2013 8. neutropenia secondary chemotherapy, Neulasta will be added with cycle 2 FOLFOX  Disposition:  She has completed 2 cycles of FOLFOX. Avastin was added with cycle 2. Ms. Thayne has tolerated the chemotherapy well and her performance status is improved. Chemotherapy will be held today  secondary to neutropenia. She knows to contact us for a fever or symptoms of infection. She will return for cycle 3 in one week. Neulasta will be added with cycle 3.We reviewed the potential toxicities associated with Neulasta including the chance for bone pain, rash, and rupture of the spleen.  She is scheduled for an office visit on 08/03/2013.   Thornton Papas, MD  07/13/2013  5:43 PM

## 2013-07-14 ENCOUNTER — Telehealth: Payer: Self-pay | Admitting: Oncology

## 2013-07-14 ENCOUNTER — Ambulatory Visit: Payer: Medicare Other | Admitting: Oncology

## 2013-07-14 NOTE — Telephone Encounter (Signed)
11/12 pof not acted on message back to Gina/BS to clarify. Per pof cx 12/3 appt w/BN and r/s for 12/19 in inf. Pt does not have a 12/19 inf and will not be do for one that day.

## 2013-07-15 ENCOUNTER — Encounter: Payer: Medicare Other | Admitting: Nutrition

## 2013-07-18 ENCOUNTER — Telehealth: Payer: Self-pay | Admitting: Oncology

## 2013-07-18 NOTE — Telephone Encounter (Signed)
Per staff message response from Almira Coaster have pt see BN in inf 11/20 disregard the 12/19. Pt already on schedule w/BN 11/20.

## 2013-07-21 ENCOUNTER — Ambulatory Visit: Payer: Medicare Other | Admitting: Nutrition

## 2013-07-21 ENCOUNTER — Ambulatory Visit: Payer: Medicare Other

## 2013-07-21 ENCOUNTER — Ambulatory Visit (HOSPITAL_BASED_OUTPATIENT_CLINIC_OR_DEPARTMENT_OTHER): Payer: Medicare Other | Admitting: Lab

## 2013-07-21 VITALS — BP 148/86 | HR 96 | Temp 98.6°F | Resp 18

## 2013-07-21 DIAGNOSIS — C189 Malignant neoplasm of colon, unspecified: Secondary | ICD-10-CM

## 2013-07-21 DIAGNOSIS — C787 Secondary malignant neoplasm of liver and intrahepatic bile duct: Secondary | ICD-10-CM

## 2013-07-21 DIAGNOSIS — Z5112 Encounter for antineoplastic immunotherapy: Secondary | ICD-10-CM

## 2013-07-21 DIAGNOSIS — Z5111 Encounter for antineoplastic chemotherapy: Secondary | ICD-10-CM

## 2013-07-21 DIAGNOSIS — C180 Malignant neoplasm of cecum: Secondary | ICD-10-CM

## 2013-07-21 LAB — COMPREHENSIVE METABOLIC PANEL (CC13)
AST: 27 U/L (ref 5–34)
Alkaline Phosphatase: 100 U/L (ref 40–150)
BUN: 12.1 mg/dL (ref 7.0–26.0)
Calcium: 9.8 mg/dL (ref 8.4–10.4)
Creatinine: 0.7 mg/dL (ref 0.6–1.1)
Glucose: 113 mg/dl (ref 70–140)

## 2013-07-21 LAB — CBC WITH DIFFERENTIAL/PLATELET
Basophils Absolute: 0.1 10*3/uL (ref 0.0–0.1)
EOS%: 1.3 % (ref 0.0–7.0)
Eosinophils Absolute: 0.1 10*3/uL (ref 0.0–0.5)
HCT: 39.5 % (ref 34.8–46.6)
HGB: 12.3 g/dL (ref 11.6–15.9)
MCH: 26.9 pg (ref 25.1–34.0)
MCV: 86.4 fL (ref 79.5–101.0)
MONO%: 17.1 % — ABNORMAL HIGH (ref 0.0–14.0)
NEUT#: 2.2 10*3/uL (ref 1.5–6.5)
NEUT%: 40.5 % (ref 38.4–76.8)
Platelets: 260 10*3/uL (ref 145–400)
RBC: 4.57 10*6/uL (ref 3.70–5.45)

## 2013-07-21 LAB — TECHNOLOGIST REVIEW

## 2013-07-21 MED ORDER — OXALIPLATIN CHEMO INJECTION 100 MG/20ML
85.0000 mg/m2 | Freq: Once | INTRAVENOUS | Status: AC
  Administered 2013-07-21: 160 mg via INTRAVENOUS
  Filled 2013-07-21: qty 32

## 2013-07-21 MED ORDER — FLUOROURACIL CHEMO INJECTION 2.5 GM/50ML
400.0000 mg/m2 | Freq: Once | INTRAVENOUS | Status: AC
  Administered 2013-07-21: 750 mg via INTRAVENOUS
  Filled 2013-07-21: qty 15

## 2013-07-21 MED ORDER — DEXTROSE 5 % IV SOLN
Freq: Once | INTRAVENOUS | Status: AC
  Administered 2013-07-21: 15:00:00 via INTRAVENOUS

## 2013-07-21 MED ORDER — DEXTROSE 5 % IV SOLN
400.0000 mg/m2 | Freq: Once | INTRAVENOUS | Status: AC
  Administered 2013-07-21: 752 mg via INTRAVENOUS
  Filled 2013-07-21: qty 37.6

## 2013-07-21 MED ORDER — SODIUM CHLORIDE 0.9 % IV SOLN
2400.0000 mg/m2 | INTRAVENOUS | Status: DC
  Administered 2013-07-21: 4500 mg via INTRAVENOUS
  Filled 2013-07-21: qty 90

## 2013-07-21 MED ORDER — SODIUM CHLORIDE 0.9 % IV SOLN: Freq: Once | INTRAVENOUS | Status: DC

## 2013-07-21 MED ORDER — HEPARIN SOD (PORK) LOCK FLUSH 100 UNIT/ML IV SOLN
500.0000 [IU] | Freq: Once | INTRAVENOUS | Status: DC | PRN
  Filled 2013-07-21: qty 5

## 2013-07-21 MED ORDER — ONDANSETRON 8 MG/50ML IVPB (CHCC)
8.0000 mg | Freq: Once | INTRAVENOUS | Status: AC
  Administered 2013-07-21: 8 mg via INTRAVENOUS

## 2013-07-21 MED ORDER — DEXAMETHASONE SODIUM PHOSPHATE 10 MG/ML IJ SOLN
INTRAMUSCULAR | Status: AC
  Filled 2013-07-21: qty 1

## 2013-07-21 MED ORDER — ONDANSETRON 8 MG/NS 50 ML IVPB
INTRAVENOUS | Status: AC
  Filled 2013-07-21: qty 8

## 2013-07-21 MED ORDER — SODIUM CHLORIDE 0.9 % IV SOLN
5.0000 mg/kg | Freq: Once | INTRAVENOUS | Status: AC
  Administered 2013-07-21: 375 mg via INTRAVENOUS
  Filled 2013-07-21: qty 15

## 2013-07-21 MED ORDER — SODIUM CHLORIDE 0.9 % IJ SOLN
10.0000 mL | INTRAMUSCULAR | Status: DC | PRN
  Filled 2013-07-21: qty 10

## 2013-07-21 MED ORDER — DEXAMETHASONE SODIUM PHOSPHATE 10 MG/ML IJ SOLN
10.0000 mg | Freq: Once | INTRAMUSCULAR | Status: AC
  Administered 2013-07-21: 10 mg via INTRAVENOUS

## 2013-07-21 NOTE — Progress Notes (Signed)
Patient reports she has improved appetite.  Her oral intake has increased and the variety of foods patient is willing to consume has also increased.  Her weight was documented as 161.8 pounds November 12.  She denies nausea.  She continues to follow a low fiber diet.  Nutrition diagnosis: Unintended weight loss continue cannot be evaluated.  Diagnosis of inadequate oral intake has improved.  Intervention: Patient encouraged to continue increased variety and intake throughout the day.  High fiber die again reviewed with patient.  Questions were answered.  Teach back method used.  Monitoring, evaluation, goals: Patient has been able to increase her oral intake.  Weight loss cannot be evaluated.  Next visit: Wednesday, December 3, during chemotherapy.

## 2013-07-21 NOTE — Patient Instructions (Signed)
[facility] Discharge Instructions for Patients Receiving Chemotherapy  Today you received the following chemotherapy agents: 33fu, avastin, leucovorin, oxaliplatin.    To help prevent nausea and vomiting after your treatment, we encourage you to take your nausea medication.  Take it as often as prescribed.     If you develop nausea and vomiting that is not controlled by your nausea medication, call the clinic. If it is after clinic hours your family physician or the after hours number for the clinic or go to the Emergency Department.   BELOW ARE SYMPTOMS THAT SHOULD BE REPORTED IMMEDIATELY:  *FEVER GREATER THAN 100.5 F  *CHILLS WITH OR WITHOUT FEVER  NAUSEA AND VOMITING THAT IS NOT CONTROLLED WITH YOUR NAUSEA MEDICATION  *UNUSUAL SHORTNESS OF BREATH  *UNUSUAL BRUISING OR BLEEDING  TENDERNESS IN MOUTH AND THROAT WITH OR WITHOUT PRESENCE OF ULCERS  *URINARY PROBLEMS  *BOWEL PROBLEMS  UNUSUAL RASH Items with * indicate a potential emergency and should be followed up as soon as possible.  Feel free to call the clinic you have any questions or concerns. The clinic phone number is [phone].   I have been informed and understand all the instructions given to me. I know to contact the clinic, my physician, or go to the Emergency Department if any problems should occur. I do not have any questions at this time, but understand that I may call the clinic during office hours   should I have any questions or need assistance in obtaining follow up care.    __________________________________________  _____________  __________ Signature of Patient or Authorized Representative            Date                   Time    __________________________________________ Nurse's Signature

## 2013-07-21 NOTE — Progress Notes (Signed)
Home infusion programmed to 5.4 mL/hr to allow for Saturday disconnect.

## 2013-07-23 ENCOUNTER — Ambulatory Visit (HOSPITAL_BASED_OUTPATIENT_CLINIC_OR_DEPARTMENT_OTHER): Payer: Medicare Other

## 2013-07-23 ENCOUNTER — Ambulatory Visit: Payer: Medicare Other

## 2013-07-23 DIAGNOSIS — D702 Other drug-induced agranulocytosis: Secondary | ICD-10-CM

## 2013-07-23 DIAGNOSIS — C189 Malignant neoplasm of colon, unspecified: Secondary | ICD-10-CM

## 2013-07-23 MED ORDER — SODIUM CHLORIDE 0.9 % IJ SOLN
10.0000 mL | INTRAMUSCULAR | Status: DC | PRN
  Administered 2013-07-23: 10 mL
  Filled 2013-07-23: qty 10

## 2013-07-23 MED ORDER — HEPARIN SOD (PORK) LOCK FLUSH 100 UNIT/ML IV SOLN
500.0000 [IU] | Freq: Once | INTRAVENOUS | Status: AC | PRN
  Administered 2013-07-23: 500 [IU]
  Filled 2013-07-23: qty 5

## 2013-07-23 MED ORDER — PEGFILGRASTIM INJECTION 6 MG/0.6ML
6.0000 mg | Freq: Once | SUBCUTANEOUS | Status: AC
  Administered 2013-07-23: 6 mg via SUBCUTANEOUS

## 2013-07-23 NOTE — Progress Notes (Signed)
Duplicate appointment.  Injection and pump d/c performed at the same treatment area.

## 2013-07-23 NOTE — Progress Notes (Signed)
Patient asked what to do about her temperature.  She is "not drinking very much water and does not drink a lot during treatments" per she and her spouse.  Reviewed why she needs to drink and how much.  Also asked if it is okay to take tylenol as she has not taken anything yet.  Instructed to FF, take tylenol or ibuprofen and to call on call provider if temperature not responding.  If shakes and chills with temp > 100.5 to go to ER for evaluation.  Reviewed not drinking cold beverages after spouse suggested a milkshake.

## 2013-07-23 NOTE — Progress Notes (Signed)
Reports temperature last night = 99.9

## 2013-07-31 ENCOUNTER — Other Ambulatory Visit: Payer: Self-pay | Admitting: Oncology

## 2013-08-03 ENCOUNTER — Other Ambulatory Visit (HOSPITAL_BASED_OUTPATIENT_CLINIC_OR_DEPARTMENT_OTHER): Payer: Medicare Other | Admitting: Lab

## 2013-08-03 ENCOUNTER — Telehealth: Payer: Self-pay | Admitting: *Deleted

## 2013-08-03 ENCOUNTER — Ambulatory Visit: Payer: Medicare Other | Admitting: Nutrition

## 2013-08-03 ENCOUNTER — Ambulatory Visit (HOSPITAL_BASED_OUTPATIENT_CLINIC_OR_DEPARTMENT_OTHER): Payer: Medicare Other

## 2013-08-03 ENCOUNTER — Encounter: Payer: Self-pay | Admitting: Oncology

## 2013-08-03 ENCOUNTER — Ambulatory Visit (HOSPITAL_BASED_OUTPATIENT_CLINIC_OR_DEPARTMENT_OTHER): Payer: Medicare Other | Admitting: Oncology

## 2013-08-03 ENCOUNTER — Telehealth: Payer: Self-pay | Admitting: Oncology

## 2013-08-03 ENCOUNTER — Other Ambulatory Visit: Payer: Self-pay | Admitting: *Deleted

## 2013-08-03 VITALS — BP 177/75 | HR 122

## 2013-08-03 VITALS — BP 152/82 | HR 99 | Temp 97.9°F | Resp 20 | Ht 65.0 in | Wt 156.9 lb

## 2013-08-03 DIAGNOSIS — Z5112 Encounter for antineoplastic immunotherapy: Secondary | ICD-10-CM

## 2013-08-03 DIAGNOSIS — C180 Malignant neoplasm of cecum: Secondary | ICD-10-CM

## 2013-08-03 DIAGNOSIS — D702 Other drug-induced agranulocytosis: Secondary | ICD-10-CM

## 2013-08-03 DIAGNOSIS — C189 Malignant neoplasm of colon, unspecified: Secondary | ICD-10-CM

## 2013-08-03 DIAGNOSIS — R5381 Other malaise: Secondary | ICD-10-CM

## 2013-08-03 DIAGNOSIS — R112 Nausea with vomiting, unspecified: Secondary | ICD-10-CM

## 2013-08-03 DIAGNOSIS — R971 Elevated cancer antigen 125 [CA 125]: Secondary | ICD-10-CM

## 2013-08-03 DIAGNOSIS — C787 Secondary malignant neoplasm of liver and intrahepatic bile duct: Secondary | ICD-10-CM

## 2013-08-03 DIAGNOSIS — F329 Major depressive disorder, single episode, unspecified: Secondary | ICD-10-CM

## 2013-08-03 DIAGNOSIS — Z5111 Encounter for antineoplastic chemotherapy: Secondary | ICD-10-CM

## 2013-08-03 DIAGNOSIS — R109 Unspecified abdominal pain: Secondary | ICD-10-CM

## 2013-08-03 LAB — CBC WITH DIFFERENTIAL/PLATELET
BASO%: 0.4 % (ref 0.0–2.0)
Basophils Absolute: 0 10*3/uL (ref 0.0–0.1)
EOS%: 2.2 % (ref 0.0–7.0)
Eosinophils Absolute: 0.2 10*3/uL (ref 0.0–0.5)
HGB: 11.9 g/dL (ref 11.6–15.9)
MCH: 27 pg (ref 25.1–34.0)
MCHC: 31.6 g/dL (ref 31.5–36.0)
MONO#: 1 10*3/uL — ABNORMAL HIGH (ref 0.1–0.9)
NEUT%: 56.4 % (ref 38.4–76.8)
RBC: 4.4 10*6/uL (ref 3.70–5.45)
RDW: 17.3 % — ABNORMAL HIGH (ref 11.2–14.5)
WBC: 8.3 10*3/uL (ref 3.9–10.3)
lymph#: 2.4 10*3/uL (ref 0.9–3.3)

## 2013-08-03 LAB — COMPREHENSIVE METABOLIC PANEL (CC13)
ALT: 21 U/L (ref 0–55)
AST: 32 U/L (ref 5–34)
Albumin: 3.6 g/dL (ref 3.5–5.0)
Anion Gap: 15 mEq/L — ABNORMAL HIGH (ref 3–11)
BUN: 8.5 mg/dL (ref 7.0–26.0)
CO2: 22 mEq/L (ref 22–29)
Calcium: 9.9 mg/dL (ref 8.4–10.4)
Chloride: 104 mEq/L (ref 98–109)
Potassium: 2.8 mEq/L — CL (ref 3.5–5.1)
Sodium: 140 mEq/L (ref 136–145)

## 2013-08-03 MED ORDER — ONDANSETRON 8 MG/NS 50 ML IVPB
INTRAVENOUS | Status: AC
  Filled 2013-08-03: qty 8

## 2013-08-03 MED ORDER — LEUCOVORIN CALCIUM INJECTION 350 MG
750.0000 mg | Freq: Once | INTRAVENOUS | Status: AC
  Administered 2013-08-03: 750 mg via INTRAVENOUS
  Filled 2013-08-03: qty 37.5

## 2013-08-03 MED ORDER — ONDANSETRON 4 MG PO TBDP: 4.0000 mg | ORAL_TABLET | Freq: Three times a day (TID) | ORAL | Status: DC | PRN

## 2013-08-03 MED ORDER — FLUOROURACIL CHEMO INJECTION 2.5 GM/50ML
400.0000 mg/m2 | Freq: Once | INTRAVENOUS | Status: AC
  Administered 2013-08-03: 750 mg via INTRAVENOUS
  Filled 2013-08-03: qty 15

## 2013-08-03 MED ORDER — ALPRAZOLAM 0.5 MG PO TABS
0.1250 mg | ORAL_TABLET | Freq: Once | ORAL | Status: AC
  Administered 2013-08-03: 0.125 mg via ORAL

## 2013-08-03 MED ORDER — LIDOCAINE-PRILOCAINE 2.5-2.5 % EX CREA
TOPICAL_CREAM | CUTANEOUS | Status: AC
  Filled 2013-08-03: qty 5

## 2013-08-03 MED ORDER — SODIUM CHLORIDE 0.9 % IV SOLN
5.0000 mg/kg | Freq: Once | INTRAVENOUS | Status: AC
  Administered 2013-08-03: 375 mg via INTRAVENOUS
  Filled 2013-08-03: qty 15

## 2013-08-03 MED ORDER — DEXAMETHASONE SODIUM PHOSPHATE 10 MG/ML IJ SOLN
INTRAMUSCULAR | Status: AC
  Filled 2013-08-03: qty 1

## 2013-08-03 MED ORDER — SODIUM CHLORIDE 0.9 % IV SOLN: Freq: Once | INTRAVENOUS | Status: DC

## 2013-08-03 MED ORDER — ONDANSETRON 8 MG/50ML IVPB (CHCC)
8.0000 mg | Freq: Once | INTRAVENOUS | Status: AC
  Administered 2013-08-03: 8 mg via INTRAVENOUS

## 2013-08-03 MED ORDER — FLUOROURACIL CHEMO INJECTION 5 GM/100ML
2400.0000 mg/m2 | INTRAVENOUS | Status: DC
  Administered 2013-08-03: 4500 mg via INTRAVENOUS
  Filled 2013-08-03: qty 90

## 2013-08-03 MED ORDER — METHYLPREDNISOLONE SODIUM SUCC 125 MG IJ SOLR
125.0000 mg | Freq: Once | INTRAMUSCULAR | Status: AC | PRN
  Administered 2013-08-03: 125 mg via INTRAVENOUS

## 2013-08-03 MED ORDER — ALPRAZOLAM 0.5 MG PO TABS
ORAL_TABLET | ORAL | Status: AC
  Filled 2013-08-03: qty 1

## 2013-08-03 MED ORDER — OXALIPLATIN CHEMO INJECTION 100 MG/20ML
85.0000 mg/m2 | Freq: Once | INTRAVENOUS | Status: AC
  Administered 2013-08-03: 160 mg via INTRAVENOUS
  Filled 2013-08-03: qty 32

## 2013-08-03 MED ORDER — DIPHENHYDRAMINE HCL 50 MG/ML IJ SOLN
50.0000 mg | Freq: Once | INTRAMUSCULAR | Status: AC | PRN
  Administered 2013-08-03 (×2): 25 mg via INTRAVENOUS

## 2013-08-03 MED ORDER — DEXAMETHASONE SODIUM PHOSPHATE 10 MG/ML IJ SOLN
10.0000 mg | Freq: Once | INTRAMUSCULAR | Status: AC
  Administered 2013-08-03: 10 mg via INTRAVENOUS

## 2013-08-03 MED ORDER — DEXTROSE 5 % IV SOLN
Freq: Once | INTRAVENOUS | Status: AC
  Administered 2013-08-03: 12:00:00 via INTRAVENOUS

## 2013-08-03 NOTE — Progress Notes (Signed)
   [facility]    OFFICE PROGRESS NOTE   INTERVAL HISTORY:   Kelly [name] returns for scheduled followup of metastatic colon cancer. She completed a third cycle of FOLFOX on 07/21/2013. She reports prolonged cold sensitivity following this cycle of chemotherapy. No peripheral numbness or tingling. She felt "foggy "after chemotherapy. Her taste is altered. She had an episode of nausea and vomiting on 07/31/2013. No difficulty with bowel function. No bleeding.  Objective:  Vital signs in last 24 hours:  Blood pressure 152/82, pulse 99, temperature 97.9 F (36.6 C), temperature source Oral, resp. rate 20, height 5\' 5"  (1.651 m), weight 156 lb 14.4 oz (71.169 kg).    HEENT: No thrush or ulcers Resp: Lungs clear bilaterally Cardio: Regular rate and rhythm GI: Soft, nontender, no hepatomegaly,? Mild fullness in the right low abdomen at the pelvic brim Vascular: No leg edema Neuro: The vibratory sense is intact at the fingertips bilaterally    Portacath/PICC-with mild erythema overlying the clavicle lateral to the catheter  Lab Results:  Lab Results  Component Value Date   WBC 8.3 08/03/2013   HGB 11.9 08/03/2013   HCT 37.7 08/03/2013   MCV 85.7 08/03/2013   PLT 201 08/03/2013   ANC 4.7    Medications: I have reviewed the patient's current medications.  Assessment/Plan: 1. Stage IV colon cancer, colonoscopy 06/02/2013 found a mass at the cecum with a biopsy confirming adenocarcinoma positive K-ras mutation, no loss of expression of mismatch repair proteins, microsatellite stable  Cycle 1 FOLFOX 06/15/2013  Cycle 2 FOLFOX/Avastin 06/29/2013 Cycle 3 FOLFOX/Avastin 07/21/2013 2. Abdominal pain secondary to the cecal mass and carcinomatosis -improved 3. Acute nausea/vomiting 06/08/2013-? Related to partial obstruction-improved  4. History of an arrhythmia  5. Depression -now taking Remeron, improved  6. Anorexia -improved  7. Port-A-Cath placement 06/14/2013  8.  neutropenia secondary chemotherapy, Neulasta was added with cycle 2 FOLFOX    Disposition:  She had malaise and prolonged cold sensitivity following the most recent cycle of chemotherapy. She is having difficulty finding foods that she likes. She will slowly advanced to a full diet since she is having no difficulty with bowel function. She will meet with the Cancer Center nutritionist today. We will repeat serum tumor markers today.  The plan is to proceed with cycle 4 FOLFOX/Avastin today. She would like to wait on the next cycle of chemotherapy until after Christmas. She is scheduled for an office visit and chemotherapy on 08/29/2013.   Thornton Papas, MD  08/03/2013  1:47 PM

## 2013-08-03 NOTE — Progress Notes (Signed)
Pt requested a xanax .125mg . States she forgot to bring her dose from home.Order obtained from Dr Truett Perna.  1435: Pt had completed chemo, stated that she is feeling tight in her chest, "can't breathe, feels like something in throat". MD notified, Lonna Cobb, NP came over to assess patient. 1436 O2 2 liters placed on patient, O2 sat 99% 1438 Benadryl 25 mg IV given 1440 Benadryl 25 mg IV given, soulmedrol 125 mg given. Red spots noted on back and right side of neck. Dr Truett Perna into see patient. Explained to patient laryngeal spasm can occur with Oxaliplatin. 1443 O2 100%.  [id number]: pt resting in chair O2 in place, constant observation by RNs 1459: pt resting quietly, voice quivery. 1510: pt up to bathroom in wheelchair with assistance 1545 Pt continues to rest, ambulated to BR, DR Granfortuna in to see patient. OK to continue with 5FU.

## 2013-08-03 NOTE — Progress Notes (Signed)
Faxed ondansetron odt 4mg  pa form to [phone].

## 2013-08-03 NOTE — Telephone Encounter (Signed)
gv and printed appt sched and avs for pt for DEC °

## 2013-08-03 NOTE — Patient Instructions (Signed)
[facility] Discharge Instructions for Patients Receiving Chemotherapy  Today you received the following chemotherapy agents Avastin/FOLFOX  To help prevent nausea and vomiting after your treatment, we encourage you to take your nausea medication as needed   If you develop nausea and vomiting that is not controlled by your nausea medication, call the clinic.   BELOW ARE SYMPTOMS THAT SHOULD BE REPORTED IMMEDIATELY:  *FEVER GREATER THAN 100.5 F  *CHILLS WITH OR WITHOUT FEVER  NAUSEA AND VOMITING THAT IS NOT CONTROLLED WITH YOUR NAUSEA MEDICATION  *UNUSUAL SHORTNESS OF BREATH  *UNUSUAL BRUISING OR BLEEDING  TENDERNESS IN MOUTH AND THROAT WITH OR WITHOUT PRESENCE OF ULCERS  *URINARY PROBLEMS  *BOWEL PROBLEMS  UNUSUAL RASH Items with * indicate a potential emergency and should be followed up as soon as possible.  Feel free to call the clinic you have any questions or concerns. The clinic phone number is [phone].

## 2013-08-03 NOTE — Progress Notes (Signed)
Patient reports that she has had difficulty eating since last treatment.  She attributes this to "not coming out of the fog" of chemotherapy.  Patient did choose to eat some spicy chili over the weekend and had an episode of vomiting.  She admits that it was a poor choice.  Per patient, M.D. has approved adding fiber-containing foods back into her diet.   She is excited to try new foods.  Nutrition diagnosis: Unintended weight loss continues.  Diagnosis of inadequate oral intake also continues.  Intervention: I reviewed specific fiber-containing foods to begin with and recommended she try one new food every 2-3 days and monitor her tolerance.  Patient able to teach back strategies for adding fiber.  I recommended patient increase fluid intake.  Also encouraged patient to increase protein, containing foods.  Monitoring, evaluation, goals: Patient has had continued weight loss with inadequate oral intake.  She will work to increase overall oral intake for weight maintenance.  Next visit: Monday, December 29, during chemotherapy.

## 2013-08-03 NOTE — Telephone Encounter (Signed)
CMET reviewed by Lonna Cobb, NP: order received to increase potassium to 2 tabs twice daily for three days. Resume once daily dosing after that. Called pt, she asked that I relay instructions to her husband. Same done. He reports she doesn't feel well after having the Benadryl for suspected chemo reaction today. They understand to call MD on call if reaction symptoms reoccur. She asks if OK to resume regular scheduled medications- yes.

## 2013-08-03 NOTE — Telephone Encounter (Signed)
Zofran Prior authorization request received from [facility].  Request to Managed Care.

## 2013-08-04 ENCOUNTER — Telehealth: Payer: Self-pay | Admitting: *Deleted

## 2013-08-04 LAB — CEA: CEA: 14.6 ng/mL — ABNORMAL HIGH (ref 0.0–5.0)

## 2013-08-04 NOTE — Telephone Encounter (Signed)
Call from pt requesting tumor marker results. CA 125 is better. CEA is about the same, slightly higher-- per Dr. Truett Perna. Scans will be more reliable measure of how the cancer is responding. Plan is to repeat scan after next treatment. Informed pt Dr. Truett Perna is considering re-challenging with Oxaliplatin, in a more diluted dose. Pt voiced understanding, pleased to hear CA 125 is better. She stated she feels well aside from voice being hoarse after chemo reaction on 08/03/13. Asks if there is anything that can be given pre-chemo to prevent a reaction.

## 2013-08-05 ENCOUNTER — Ambulatory Visit (HOSPITAL_BASED_OUTPATIENT_CLINIC_OR_DEPARTMENT_OTHER): Payer: Medicare Other

## 2013-08-05 ENCOUNTER — Telehealth: Payer: Self-pay | Admitting: Oncology

## 2013-08-05 ENCOUNTER — Telehealth: Payer: Self-pay | Admitting: *Deleted

## 2013-08-05 ENCOUNTER — Ambulatory Visit (HOSPITAL_BASED_OUTPATIENT_CLINIC_OR_DEPARTMENT_OTHER): Payer: Medicare Other | Admitting: Nurse Practitioner

## 2013-08-05 VITALS — BP 164/77 | HR 103 | Temp 98.6°F

## 2013-08-05 DIAGNOSIS — C787 Secondary malignant neoplasm of liver and intrahepatic bile duct: Secondary | ICD-10-CM

## 2013-08-05 DIAGNOSIS — C189 Malignant neoplasm of colon, unspecified: Secondary | ICD-10-CM

## 2013-08-05 DIAGNOSIS — C180 Malignant neoplasm of cecum: Secondary | ICD-10-CM

## 2013-08-05 DIAGNOSIS — R0789 Other chest pain: Secondary | ICD-10-CM

## 2013-08-05 DIAGNOSIS — Z5189 Encounter for other specified aftercare: Secondary | ICD-10-CM

## 2013-08-05 DIAGNOSIS — R21 Rash and other nonspecific skin eruption: Secondary | ICD-10-CM

## 2013-08-05 MED ORDER — PEGFILGRASTIM INJECTION 6 MG/0.6ML
6.0000 mg | Freq: Once | SUBCUTANEOUS | Status: AC
  Administered 2013-08-05: 6 mg via SUBCUTANEOUS
  Filled 2013-08-05: qty 0.6

## 2013-08-05 MED ORDER — SODIUM CHLORIDE 0.9 % IJ SOLN
10.0000 mL | INTRAMUSCULAR | Status: DC | PRN
  Administered 2013-08-05: 10 mL
  Filled 2013-08-05: qty 10

## 2013-08-05 MED ORDER — HEPARIN SOD (PORK) LOCK FLUSH 100 UNIT/ML IV SOLN
500.0000 [IU] | Freq: Once | INTRAVENOUS | Status: AC | PRN
  Administered 2013-08-05: 500 [IU]
  Filled 2013-08-05: qty 5

## 2013-08-05 NOTE — Progress Notes (Addendum)
OFFICE PROGRESS NOTE  Interval history:  Kelly [name] is a 72 year old woman with metastatic colon cancer. She completed cycle 4 FOLFOX/Avastin on 08/03/2013. At the end of the oxaliplatin platinum infusion she developed chest tightness and a rash. She was given Solu-Medrol and Benadryl. The chest tightness resolved. The rash progressed to diffuse erythema covering the entire back. The rash over her back resolved prior to discharge home.  She presents to the office today for a Neulasta injection. She requested evaluation of a skin rash.  She noted an erythematous rash on the arms and legs up this morning. The rash is not puritic. Yesterday she felt well. Today she notes a decline in her energy level. She denies nausea/vomiting. No diarrhea. No mouth sores.   Objective: There were no vitals taken for this visit.  Oropharynx is without thrush or ulceration. Mucous membranes are moist. Erythematous, slightly raised, maculopapular rash scattered over the thighs, arms and trunk.  Lab Results: Lab Results  Component Value Date   WBC 8.3 08/03/2013   HGB 11.9 08/03/2013   HCT 37.7 08/03/2013   MCV 85.7 08/03/2013   PLT 201 08/03/2013    Chemistry:    Chemistry      Component Value Date/Time   NA 140 08/03/2013 0904   NA 139 06/03/2013 0506   K 2.8* 08/03/2013 0904   K 3.7 06/03/2013 0506   CL 104 06/03/2013 0506   CO2 22 08/03/2013 0904   CO2 27 06/03/2013 0506   BUN 8.5 08/03/2013 0904   BUN 4* 06/03/2013 0506   CREATININE 0.8 08/03/2013 0904   CREATININE 0.63 06/03/2013 0506      Component Value Date/Time   CALCIUM 9.9 08/03/2013 0904   CALCIUM 8.9 06/03/2013 0506   ALKPHOS 141 08/03/2013 0904   ALKPHOS 81 05/30/2013 0943   AST 32 08/03/2013 0904   AST 18 05/30/2013 0943   ALT 21 08/03/2013 0904   ALT 9 05/30/2013 0943   BILITOT 0.35 08/03/2013 0904   BILITOT 0.6 05/30/2013 0943       Studies/Results: No results found.  Medications: I have reviewed the patient's current  medications.  Assessment/Plan:  1. Metastatic colon cancer status post cycle 4 FOLFOX/Avastin 08/03/2013. 2. Chest tightness and skin rash following oxaliplatin infusion on 08/03/2013. 3. Skin rash involving the trunk and extremities. The appearance is consistent with a drug rash. Question related to oxaliplatin.  Disposition-she has completed 4 cycles of FOLFOX/Avastin. The CEA was slightly higher and the CA 125 was improved on 08/03/2013. She developed chest tightness and a skin rash following the oxaliplatin infusion on 08/03/2013. She presents today with a rash involving the trunk and extremities that appears consistent with a drug rash. We are concerned the current rash is related to oxaliplatin.  Dr. Truett Perna recommends proceeding with a restaging CT evaluation prior to cycle 5 FOLFOX/Avastin which is scheduled on 08/29/2013.  Dr. Truett Perna will make a decision regarding proceeding with further oxaliplatin with steroid premedication pending the CT result.  She is scheduled to be seen in followup on 08/29/2013.  She understands to contact the office in the interim if the rash worsens.    Patient seen with Dr. Truett Perna.  25 minutes were spent face-to-face at today's visit with the majority of that time involved in counseling/coordination of care.   Lonna Cobb ANP/GNP-BC    This was a shared this with Lonna Cobb. She appears to have a "drug rash "on exam today. I saw her in the chemotherapy room after she developed dyspnea  and a rash following the oxaliplatin infusion on 08/03/2013. It appears she had an allergic reaction to oxaliplatin. We decided to proceed with a restaging CT evaluation prior to deciding on further systemic therapy. If there is marked improvement in the metastatic colon cancer we will consider retreatment with oxaliplatin after additional premedication.  Kelly [name], M.D.

## 2013-08-05 NOTE — Telephone Encounter (Signed)
Per staff message and POF I have scheduled appts.  JMW  

## 2013-08-05 NOTE — Telephone Encounter (Signed)
Call from pt reporting rash on arms and wrist. She noticed this today. Also, was told to keep an eye on erythema near her port-- unable to see site with port dressing in place. Will ask RN to assess both at pump DC appt today. Pt reports she is beginning to feel fatigued. Hoarseness is improving.

## 2013-08-05 NOTE — Telephone Encounter (Signed)
pt adv CT will call with d/t of appt for scan no other orders on 12/5 POF shh

## 2013-08-05 NOTE — Patient Instructions (Signed)

## 2013-08-17 ENCOUNTER — Other Ambulatory Visit: Payer: Self-pay | Admitting: Family Medicine

## 2013-08-17 NOTE — Telephone Encounter (Signed)
plz phone in. 

## 2013-08-17 NOTE — Telephone Encounter (Signed)
Ok to refill 

## 2013-08-17 NOTE — Telephone Encounter (Signed)
Rx called in as directed.   

## 2013-08-18 ENCOUNTER — Encounter (HOSPITAL_COMMUNITY): Payer: Self-pay

## 2013-08-18 ENCOUNTER — Ambulatory Visit (HOSPITAL_COMMUNITY)
Admission: RE | Admit: 2013-08-18 | Discharge: 2013-08-18 | Disposition: A | Discharge status: Home / Self Care | Payer: Medicare Other | Source: Ambulatory Visit | Attending: Nurse Practitioner | Admitting: Nurse Practitioner

## 2013-08-18 DIAGNOSIS — C787 Secondary malignant neoplasm of liver and intrahepatic bile duct: Secondary | ICD-10-CM | POA: Insufficient documentation

## 2013-08-18 DIAGNOSIS — Z79899 Other long term (current) drug therapy: Secondary | ICD-10-CM | POA: Insufficient documentation

## 2013-08-18 DIAGNOSIS — Z9089 Acquired absence of other organs: Secondary | ICD-10-CM | POA: Insufficient documentation

## 2013-08-18 DIAGNOSIS — M51379 Other intervertebral disc degeneration, lumbosacral region without mention of lumbar back pain or lower extremity pain: Secondary | ICD-10-CM | POA: Insufficient documentation

## 2013-08-18 DIAGNOSIS — M4310 Spondylolisthesis, site unspecified: Secondary | ICD-10-CM | POA: Insufficient documentation

## 2013-08-18 DIAGNOSIS — R1903 Right lower quadrant abdominal swelling, mass and lump: Secondary | ICD-10-CM | POA: Insufficient documentation

## 2013-08-18 DIAGNOSIS — C189 Malignant neoplasm of colon, unspecified: Secondary | ICD-10-CM | POA: Insufficient documentation

## 2013-08-18 DIAGNOSIS — K5900 Constipation, unspecified: Secondary | ICD-10-CM | POA: Insufficient documentation

## 2013-08-18 DIAGNOSIS — K5989 Other specified functional intestinal disorders: Secondary | ICD-10-CM | POA: Insufficient documentation

## 2013-08-18 DIAGNOSIS — R911 Solitary pulmonary nodule: Secondary | ICD-10-CM | POA: Insufficient documentation

## 2013-08-18 DIAGNOSIS — M5137 Other intervertebral disc degeneration, lumbosacral region: Secondary | ICD-10-CM | POA: Insufficient documentation

## 2013-08-18 DIAGNOSIS — Z90710 Acquired absence of both cervix and uterus: Secondary | ICD-10-CM | POA: Insufficient documentation

## 2013-08-18 DIAGNOSIS — C786 Secondary malignant neoplasm of retroperitoneum and peritoneum: Secondary | ICD-10-CM | POA: Insufficient documentation

## 2013-08-18 DIAGNOSIS — R197 Diarrhea, unspecified: Secondary | ICD-10-CM | POA: Insufficient documentation

## 2013-08-18 MED ORDER — IOHEXOL 300 MG/ML  SOLN
50.0000 mL | Freq: Once | INTRAMUSCULAR | Status: AC | PRN
  Administered 2013-08-18: 50 mL via ORAL

## 2013-08-18 MED ORDER — IOHEXOL 300 MG/ML  SOLN
100.0000 mL | Freq: Once | INTRAMUSCULAR | Status: AC | PRN
  Administered 2013-08-18: 100 mL via INTRAVENOUS

## 2013-08-19 ENCOUNTER — Telehealth: Payer: Self-pay | Admitting: *Deleted

## 2013-08-19 NOTE — Telephone Encounter (Signed)
Message from pt requesting CT results. Returned call, scan shows overall improvement, per Dr. Truett Perna. Will review in detail at office visit. Pt reports she threw up several times last night. Had a "gnawing pain" in abdomen with cramping every few minutes. Also reports "cold sweats" for about 3 hours last night. Took Zofran ODT. Still having nausea today, no vomiting. Unable to tolerate solids today. Tolerating fluids. Reports some constipation, took Senna last night. Plans to repeat Senna tonight. Reviewed with Lonna Cobb, NP and Dr. Truett Perna: Soft/ bland diet over the weekend. Push fluids. Call MD on call for N/V that doesn't respond to antiemetic. Call Mon with update. Pt voiced understanding.

## 2013-08-22 NOTE — Telephone Encounter (Signed)
Received message from pt stating "feeling better--no sweats or vomiting"

## 2013-08-26 ENCOUNTER — Other Ambulatory Visit: Payer: Self-pay | Admitting: Oncology

## 2013-08-28 ENCOUNTER — Other Ambulatory Visit: Payer: Self-pay | Admitting: Oncology

## 2013-08-29 ENCOUNTER — Ambulatory Visit (HOSPITAL_COMMUNITY)
Admission: RE | Admit: 2013-08-29 | Discharge: 2013-08-29 | Disposition: A | Discharge status: Home / Self Care | Payer: Medicare Other | Source: Ambulatory Visit | Attending: Oncology | Admitting: Oncology

## 2013-08-29 ENCOUNTER — Ambulatory Visit: Payer: Medicare Other

## 2013-08-29 ENCOUNTER — Ambulatory Visit: Payer: Medicare Other | Admitting: Nutrition

## 2013-08-29 ENCOUNTER — Other Ambulatory Visit: Payer: Self-pay | Admitting: Oncology

## 2013-08-29 ENCOUNTER — Ambulatory Visit (HOSPITAL_BASED_OUTPATIENT_CLINIC_OR_DEPARTMENT_OTHER): Payer: Medicare Other | Admitting: Oncology

## 2013-08-29 ENCOUNTER — Other Ambulatory Visit (HOSPITAL_BASED_OUTPATIENT_CLINIC_OR_DEPARTMENT_OTHER): Payer: Medicare Other

## 2013-08-29 ENCOUNTER — Telehealth: Payer: Self-pay | Admitting: Oncology

## 2013-08-29 VITALS — BP 142/75 | HR 108 | Temp 97.8°F | Resp 18 | Ht 65.0 in | Wt 150.0 lb

## 2013-08-29 DIAGNOSIS — R109 Unspecified abdominal pain: Secondary | ICD-10-CM

## 2013-08-29 DIAGNOSIS — C787 Secondary malignant neoplasm of liver and intrahepatic bile duct: Secondary | ICD-10-CM

## 2013-08-29 DIAGNOSIS — K56609 Unspecified intestinal obstruction, unspecified as to partial versus complete obstruction: Secondary | ICD-10-CM

## 2013-08-29 DIAGNOSIS — C189 Malignant neoplasm of colon, unspecified: Secondary | ICD-10-CM | POA: Insufficient documentation

## 2013-08-29 DIAGNOSIS — D702 Other drug-induced agranulocytosis: Secondary | ICD-10-CM

## 2013-08-29 DIAGNOSIS — R112 Nausea with vomiting, unspecified: Secondary | ICD-10-CM | POA: Insufficient documentation

## 2013-08-29 DIAGNOSIS — C180 Malignant neoplasm of cecum: Secondary | ICD-10-CM

## 2013-08-29 DIAGNOSIS — K5989 Other specified functional intestinal disorders: Secondary | ICD-10-CM | POA: Insufficient documentation

## 2013-08-29 DIAGNOSIS — C801 Malignant (primary) neoplasm, unspecified: Secondary | ICD-10-CM | POA: Insufficient documentation

## 2013-08-29 DIAGNOSIS — F329 Major depressive disorder, single episode, unspecified: Secondary | ICD-10-CM

## 2013-08-29 DIAGNOSIS — F3289 Other specified depressive episodes: Secondary | ICD-10-CM

## 2013-08-29 LAB — CBC WITH DIFFERENTIAL/PLATELET
Basophils Absolute: 0.1 10*3/uL (ref 0.0–0.1)
Eosinophils Absolute: 0 10*3/uL (ref 0.0–0.5)
HCT: 40.1 % (ref 34.8–46.6)
HGB: 12.2 g/dL (ref 11.6–15.9)
LYMPH%: 17.6 % (ref 14.0–49.7)
MCV: 90.9 fL (ref 79.5–101.0)
MONO#: 1.3 10*3/uL — ABNORMAL HIGH (ref 0.1–0.9)
MONO%: 14.6 % — ABNORMAL HIGH (ref 0.0–14.0)
NEUT#: 6.1 10*3/uL (ref 1.5–6.5)
NEUT%: 66.9 % (ref 38.4–76.8)
Platelets: 266 10*3/uL (ref 145–400)
RBC: 4.41 10*6/uL (ref 3.70–5.45)
WBC: 9.1 10*3/uL (ref 3.9–10.3)

## 2013-08-29 LAB — UA PROTEIN, DIPSTICK - CHCC: Protein, ur: <30 mg/dL

## 2013-08-29 MED ORDER — MIRTAZAPINE 15 MG PO TABS: 15.0000 mg | ORAL_TABLET | Freq: Every day | ORAL | Status: AC

## 2013-08-29 NOTE — Progress Notes (Signed)
[facility]    OFFICE PROGRESS NOTE   INTERVAL HISTORY:   Kelly [name] returns for scheduled followup of metastatic colon cancer. She completed cycle 4 of FOLFOX/Avastin on 08/03/2013. She had an allergic reaction at the end of the oxaliplatin infusion. She had an erythematous maculopapular rash when she was seen in followup on 08/05/2013. She reports the rash resolved over several days.  Kelly [name] has noted mild nose bleeding. Her chief complaint today is intermittent vomiting. She vomits during the night or in the early morning and has noted vomiting food she ate the previous day. No nausea. No bowel movement for the past few days, but prior to this she had been having bowel movements.  She reports anorexia.  Objective:  Vital signs in last 24 hours:  Blood pressure 142/75, pulse 108, temperature 97.8 F (36.6 C), temperature source Oral, resp. rate 18, height 5\' 5"  (1.651 m), weight 150 lb (68.04 kg).    HEENT: No thrush or ulcers Resp: Lungs clear bilaterally Cardio: Regular rate and rhythm GI: No hepatomegaly, no apparent ascites, active bowel sounds, non-distended, nontender Vascular: No leg edema  Skin: No apparent rash   Portacath/PICC-without erythema  Lab Results:  Lab Results  Component Value Date   WBC 9.1 08/29/2013   HGB 12.2 08/29/2013   HCT 40.1 08/29/2013   MCV 90.9 08/29/2013   PLT 266 08/29/2013   NEUTROABS 6.1 08/29/2013   X-rays: CT of the abdomen and pelvis on 08/18/2013, compared to 06/02/2013-no significant change in the liver metastases, the cecal mass measures slightly smaller, mildly dilated distal small bowel loops, slight decrease in omental/peritoneal nodularity, tiny left lung base nodule  I reviewed the CT in radiology and the metastatic tumor burden does not appear significantly changed. Previously noted colonic distention appears improved.  Abdominal x-ray 08/29/2013-mild small bowel dilatation with air-fluid  levels, gas is seen in the rectosigmoid colon   Medications: I have reviewed the patient's current medications.  Assessment/Plan: 1. Stage IV colon cancer, colonoscopy 06/02/2013 found a mass at the cecum with a biopsy confirming adenocarcinoma positive K-ras mutation, no loss of expression of mismatch repair proteins, microsatellite stable  Cycle 1 FOLFOX 06/15/2013  Cycle 2 FOLFOX/Avastin 06/29/2013  Cycle 3 FOLFOX/Avastin 07/21/2013 Cycle 4 FOLFOX/Avastin 08/03/2013 2. 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 Acute nausea/vomiting 06/08/2013-? Related to partial obstruction-initially improved, now with recurrent obstructive symptoms 4. History of an arrhythmia  5. Depression -now taking Remeron, improved  6. Anorexia -persistent 7. Port-A-Cath placement 06/14/2013  8. neutropenia secondary chemotherapy, Neulasta was added with cycle 2 FOLFOX  9. Allergic reaction at the completion of the oxaliplatin infusion with cycle 4   Disposition:  Kelly [name] has completed 4 cycles of FOLFOX. She developed an allergic reaction to oxaliplatin with cycle 4. She now has obstructive symptoms and a CT on 08/18/2013 does not reveal significant improvement in the metastatic tumor burden.  I discussed treatment options with the Kelly [name] and her family. I discussed the case with Dr. Claud Kelp and he reviewed the abdominal x-rays. She appears to have symptoms related to partial small bowel obstruction. The small bowel obstruction could be secondary to the cecum tumor or carcinomatosis.  We decided to discontinue FOLFOX chemotherapy. Chemotherapy will be changed to FOLFIRI. I reviewed the potential toxicities associated with irinotecan including the chance for nausea/vomiting, mucositis, diarrhea, hematologic toxicity, abdominal pain, and alopecia. She agrees to proceed. Avastin will be held since she may require bowel  surgery in the near future.  Kelly [name] will  begin a liquid diet and Senokot/MiraLAX. She met with the cancer Center nutritionist today. She will return for repeat assessment and  FOLFIRI on 08/31/2013. If the obstructive symptoms have not improved we will hold chemotherapy and arrange for an urgent surgical consultation. She will be scheduled for an elective appointment with Dr. Derrell Lolling for the week of 09/05/2013.  I will ask Dr. Cyndie Chime to evaluate her on 08/31/2013.  Approximately 45 minutes were spent with patient today. The majority of the time was used for counseling and coordination of care.   Thornton Papas, MD  08/29/2013  2:25 PM

## 2013-08-29 NOTE — Telephone Encounter (Signed)
appts made and printed. Pt is aware that tx will be added. i emailed MW to add the tx's...td 

## 2013-08-29 NOTE — Telephone Encounter (Signed)
gv adn pritned appt sched and avs for pt for DEC and Jan...sed addedt x.

## 2013-08-29 NOTE — Progress Notes (Signed)
Nutrition followup completed with patient who has been diagnosed with metastatic colon cancer.  X-ray reveals patient has a small, partial small bowel obstruction and requires full liquid diet for at least the next 2 days.  Weight has decreased to 150 pounds on December 29 from 156.9 pounds December 3.  Patient states M.D. has discontinued oxaliplatin and she will no longer need to avoid cold foods.  Patient requesting information on full liquid diet.  Nutrition diagnosis: Unintended weight loss continues. Diagnosis of inadequate oral intake also continues.  Intervention: Patient and family were educated on full liquid diet, avoiding all fiber-containing foods.  Patient educated to consume foods every 2 hours throughout the day.  I have worked out a Actor using patient food preferences for patient to follow.  Multiple questions answered.  Patient able to teach back full liquid diet.  I've recommended patient consume resource breeze twice a day in addition to other liquids.  Fact sheets and recipes were provided.  Monitoring, evaluation, goals:  Patient will tolerate full liquid diet with adequate calories and protein to minimize further weight loss.  Next visit: Wednesday, January 14, during chemotherapy.

## 2013-08-30 ENCOUNTER — Other Ambulatory Visit: Payer: Self-pay | Admitting: *Deleted

## 2013-08-30 DIAGNOSIS — C189 Malignant neoplasm of colon, unspecified: Secondary | ICD-10-CM

## 2013-08-30 DIAGNOSIS — C787 Secondary malignant neoplasm of liver and intrahepatic bile duct: Secondary | ICD-10-CM

## 2013-08-31 ENCOUNTER — Other Ambulatory Visit (HOSPITAL_BASED_OUTPATIENT_CLINIC_OR_DEPARTMENT_OTHER): Payer: Medicare Other

## 2013-08-31 ENCOUNTER — Telehealth: Payer: Self-pay | Admitting: *Deleted

## 2013-08-31 ENCOUNTER — Ambulatory Visit (HOSPITAL_BASED_OUTPATIENT_CLINIC_OR_DEPARTMENT_OTHER): Payer: Medicare Other | Admitting: Oncology

## 2013-08-31 ENCOUNTER — Ambulatory Visit (HOSPITAL_BASED_OUTPATIENT_CLINIC_OR_DEPARTMENT_OTHER): Payer: Medicare Other

## 2013-08-31 VITALS — BP 147/69 | HR 98 | Temp 98.2°F | Resp 16

## 2013-08-31 VITALS — BP 139/62 | HR 105 | Temp 98.1°F | Resp 18 | Ht 65.0 in | Wt 150.3 lb

## 2013-08-31 DIAGNOSIS — C180 Malignant neoplasm of cecum: Secondary | ICD-10-CM

## 2013-08-31 DIAGNOSIS — C787 Secondary malignant neoplasm of liver and intrahepatic bile duct: Secondary | ICD-10-CM

## 2013-08-31 DIAGNOSIS — C189 Malignant neoplasm of colon, unspecified: Secondary | ICD-10-CM

## 2013-08-31 DIAGNOSIS — R1031 Right lower quadrant pain: Secondary | ICD-10-CM

## 2013-08-31 DIAGNOSIS — C786 Secondary malignant neoplasm of retroperitoneum and peritoneum: Secondary | ICD-10-CM

## 2013-08-31 DIAGNOSIS — Z5111 Encounter for antineoplastic chemotherapy: Secondary | ICD-10-CM

## 2013-08-31 LAB — COMPREHENSIVE METABOLIC PANEL (CC13)
Albumin: 3.4 g/dL — ABNORMAL LOW (ref 3.5–5.0)
Alkaline Phosphatase: 82 U/L (ref 40–150)
BUN: 9.6 mg/dL (ref 7.0–26.0)
Calcium: 9.5 mg/dL (ref 8.4–10.4)
Chloride: 104 mEq/L (ref 98–109)
Glucose: 107 mg/dl (ref 70–140)
Potassium: 4.4 mEq/L (ref 3.5–5.1)
Total Bilirubin: 0.5 mg/dL (ref 0.20–1.20)

## 2013-08-31 MED ORDER — FLUOROURACIL CHEMO INJECTION 2.5 GM/50ML
420.0000 mg/m2 | Freq: Once | INTRAVENOUS | Status: AC
  Administered 2013-08-31: 750 mg via INTRAVENOUS
  Filled 2013-08-31: qty 15

## 2013-08-31 MED ORDER — LEUCOVORIN CALCIUM INJECTION 350 MG
414.0000 mg/m2 | Freq: Once | INTRAMUSCULAR | Status: AC
  Administered 2013-08-31: 750 mg via INTRAVENOUS
  Filled 2013-08-31: qty 37.5

## 2013-08-31 MED ORDER — SODIUM CHLORIDE 0.9 % IV SOLN
Freq: Once | INTRAVENOUS | Status: AC
  Administered 2013-08-31: 11:00:00 via INTRAVENOUS

## 2013-08-31 MED ORDER — ATROPINE SULFATE 1 MG/ML IJ SOLN
0.5000 mg | Freq: Once | INTRAMUSCULAR | Status: AC | PRN
  Administered 2013-08-31: 0.5 mg via INTRAVENOUS

## 2013-08-31 MED ORDER — DEXAMETHASONE SODIUM PHOSPHATE 20 MG/5ML IJ SOLN
20.0000 mg | Freq: Once | INTRAMUSCULAR | Status: AC
  Administered 2013-08-31: 20 mg via INTRAVENOUS

## 2013-08-31 MED ORDER — IRINOTECAN HCL CHEMO INJECTION 100 MG/5ML
180.0000 mg/m2 | Freq: Once | INTRAVENOUS | Status: AC
  Administered 2013-08-31: 326 mg via INTRAVENOUS
  Filled 2013-08-31: qty 16.3

## 2013-08-31 MED ORDER — ONDANSETRON 16 MG/50ML IVPB (CHCC)
16.0000 mg | Freq: Once | INTRAVENOUS | Status: AC
Start: 2013-08-31 — End: 2013-08-31
  Administered 2013-08-31: 16 mg via INTRAVENOUS

## 2013-08-31 MED ORDER — ONDANSETRON 16 MG/50ML IVPB (CHCC)
INTRAVENOUS | Status: AC
  Filled 2013-08-31: qty 16

## 2013-08-31 MED ORDER — ATROPINE SULFATE 1 MG/ML IJ SOLN
INTRAMUSCULAR | Status: AC
  Filled 2013-08-31: qty 1

## 2013-08-31 MED ORDER — DEXAMETHASONE SODIUM PHOSPHATE 20 MG/5ML IJ SOLN
INTRAMUSCULAR | Status: AC
  Filled 2013-08-31: qty 5

## 2013-08-31 MED ORDER — SODIUM CHLORIDE 0.9 % IV SOLN
2480.0000 mg/m2 | INTRAVENOUS | Status: DC
  Administered 2013-08-31: 4500 mg via INTRAVENOUS
  Filled 2013-08-31: qty 90

## 2013-08-31 NOTE — Progress Notes (Signed)
1350 -  Pt completed first time Irinotecan.  Pt complained of lightheadedness, and " foggy throat ".  Denied short of breath, tightness in chest, denied nausea/vomiting.  Remained A&O x 3 with husband at chair side.  Normal saline kept running for close observation.  VSs taken, stable, and documented.  Pt was able to ambulate to bathroom with assistance from husband x 1, with [place], NT x1 without incidence.  Dr. Cyndie Chime notified. No new orders received except to keep close monitoring.  Explanations given to both pt and husband.  Both voiced understanding. 1445 -  Pt stated completed resolved of foggy throat, stated feeling much better, and ready to have next chemo 5FU. 1515 -  Pt was stable at discharge ambulating with husband.

## 2013-08-31 NOTE — Progress Notes (Signed)
Hematology and Oncology Follow Up Visit  Kelly [name] [id number] [birth year] 72 y.o. 08/31/2013 10:03 AM   Principle Diagnosis: Encounter Diagnoses  Name Primary?  . RLQ abdominal pain Yes  . Colon carcinoma metastatic to liver      Interim History:  Short interval followup visit for this 72 year old woman under active treatment for metastatic colon cancer. She has extensive liver involvement. She has peritoneal and omental metastases. She has a dominant mass in the area of the cecum. She has been having intermittent obstructive symptoms. CT scan just done on 08/18/2013 showed a minor response to recent chemotherapy with FOLFOX. Possible partial distal small bowel obstruction. Due to the minimal response to the FOLFOX and recent significant allergic reaction to the oxaliplatin, plan is to change her to FOLFIRI today. She was put on a clear liquid diet for the last few days. Her obstructive symptoms have resolved. She had a bowel movement yesterday. No further vomiting.   Medications: reviewed  Allergies:  Allergies  Allergen Reactions  . Adhesive [Tape] Other (See Comments)    Tears skin  . Amoxicillin     REACTION: RASH  . Clarithromycin     REACTION: GI UPSET  . Codeine Nausea Only and Palpitations    Review of Systems:  Remaining ROS negative.  Physical Exam: Blood pressure 139/62, pulse 105, temperature 98.1 F (36.7 C), temperature source Oral, resp. rate 18, height 5\' 5"  (1.651 m), weight 150 lb 4.8 oz (68.176 kg), SpO2 98.00%. Wt Readings from Last 3 Encounters:  08/31/13 150 lb 4.8 oz (68.176 kg)  08/29/13 150 lb (68.04 kg)  08/03/13 156 lb 14.4 oz (71.169 kg)     General appearance: Adequately nourished Caucasian woman HENNT: Pharynx no erythema, exudate, mass, or ulcer. No thyromegaly or thyroid nodules Lymph nodes: No cervical, supraclavicular, or axillary lymphadenopathy Breasts:  Lungs: Clear to auscultation, resonant to percussion  throughout Heart: Regular rhythm, no murmur, no gallop, no rub, no click, no edema Abdomen: Soft, nontender, absent l bowel sounds, no mass, no organomegaly Extremities: No edema, no calf tenderness Musculoskeletal: no joint deformities GU:   Neurologic: Alert, oriented, PERRLA, , cranial nerves grossly normal, motor strength 5 over 5, reflexes 1+ symmetric, upper body coordination normal, gait normal, Skin: No rash or ecchymosis  Lab Results: CBC W/Diff    Component Value Date/Time   WBC 9.1 08/29/2013 0848   WBC 9.9 06/14/2013 1314   RBC 4.41 08/29/2013 0848   RBC 4.55 06/14/2013 1314   HGB 12.2 08/29/2013 0848   HGB 12.2 06/14/2013 1314   HCT 40.1 08/29/2013 0848   HCT 39.1 06/14/2013 1314   PLT 266 08/29/2013 0848   PLT 430* 06/14/2013 1314   MCV 90.9 08/29/2013 0848   MCV 85.9 06/14/2013 1314   MCH 27.7 08/29/2013 0848   MCH 26.8 06/14/2013 1314   MCHC 30.4* 08/29/2013 0848   MCHC 31.2 06/14/2013 1314   RDW 19.8* 08/29/2013 0848   RDW 13.6 06/14/2013 1314   LYMPHSABS 1.6 08/29/2013 0848   LYMPHSABS 1.5 06/14/2013 1314   MONOABS 1.3* 08/29/2013 0848   MONOABS 1.0 06/14/2013 1314   EOSABS 0.0 08/29/2013 0848   EOSABS 0.1 06/14/2013 1314   BASOSABS 0.1 08/29/2013 0848   BASOSABS 0.0 06/14/2013 1314     Chemistry      Component Value Date/Time   NA 142 08/31/2013 0905   NA 139 06/03/2013 0506   K 4.4 08/31/2013 0905   K 3.7 06/03/2013 0506   CL 104 06/03/2013 0506  CO2 25 08/31/2013 0905   CO2 27 06/03/2013 0506   BUN 9.6 08/31/2013 0905   BUN 4* 06/03/2013 0506   CREATININE 0.7 08/31/2013 0905   CREATININE 0.63 06/03/2013 0506      Component Value Date/Time   CALCIUM 9.5 08/31/2013 0905   CALCIUM 8.9 06/03/2013 0506   ALKPHOS 82 08/31/2013 0905   ALKPHOS 81 05/30/2013 0943   AST 26 08/31/2013 0905   AST 18 05/30/2013 0943   ALT 16 08/31/2013 0905   ALT 9 05/30/2013 0943   BILITOT 0.50 08/31/2013 0905   BILITOT 0.6 05/30/2013 0943       Radiological  Studies: Ct Abdomen Pelvis W Contrast  08/18/2013   CLINICAL DATA:  Colon cancer diagnosed in 2014 with chemotherapy in progress. Hysterectomy. Appendectomy. Constipation and diarrhea.  EXAM: CT ABDOMEN AND PELVIS WITH CONTRAST  TECHNIQUE: Multidetector CT imaging of the abdomen and pelvis was performed using the standard protocol following bolus administration of intravenous contrast.  CONTRAST:  OMNIPAQUE IOHEXOL 300 MG/ML  SOLN  COMPARISON:  06/02/2013.  05/30/2013 and 02/15/2013.  FINDINGS: Lower Chest: Left base scarring or atelectasis. A subpleural 2 mm left lower lobe nodule on image 3 is either not present or not included on prior exams.  Normal heart size without pericardial or pleural effusion.  Abdomen/Pelvis: Extensive hepatic metastasis. Index lesion in the lateral segment left liver lobe measures 3.5 x 3.5 cm on image 22 versus 4.2 x 4.1 cm at the same level on the prior of 05/30/2013.  Index right hepatic lobe lesion measures 2.3 x 1.9 cm on image 19/series 2. 2.1 x 1.7 cm at the same level on the prior.  A Central right liver lobe 1.2 cm lesion on image might have enlarged minimally from 9 mm on the prior.  Splenule. Normal stomach, pancreas, gallbladder, biliary tract, adrenal glands.  Too small to characterize lesions within the kidneys bilaterally. Bilateral extrarenal pelves without hydroureter.  No retroperitoneal or retrocrural adenopathy. A primary versus serosal implant within the inferior aspect of the cecum measures 4.0 cm on image 62 versus 4.6 cm on the prior. There are loops of mildly dilated mid to distal ileum, including on image 45 of 3.6 cm. These continue to an area of bowel wall thickening within the pelvis on image 62/series 2. This appearance is similar to on prior exams. No proximal dilatation to suggest high-grade obstruction.  Slight decrease in omental/peritoneal nodularity. Soft tissue thickening on image 50 measures 1.4 cm today versus 1.7 cm at the same level on  the prior. No abdominal ascites. Slight increase in small volume left pelvic hip cul-de-sac fluid.  No pelvic adenopathy. Hysterectomy. Normal urinary bladder. Redemonstration of serosal disease on right pelvic bowel loops, including image 63/series 2.  Bones/Musculoskeletal: Bilateral L5 pars defects. Grade 1-2 anterolisthesis of L5 on S1. Advanced degenerative disc disease at this level. Disc bulge at L4-5.  IMPRESSION: 1. Mild improvement in hepatic metastasis. 2. Slight improvement in peritoneal/omental metastasis. 3. Dominant mass off the inferior aspect of the cecum is decreased in size and could represent a serosal metastasis or a colon primary. 4. Similar mid to distal ileal dilatation with relatively decompressed distal ileal loops with extensive surrounding serosal disease. This suggests a component of partial distal small bowel obstruction. No proximal propagation to suggest high-grade obstruction. 5. Slight increase in left-sided cul-de-sac fluid, likely malignant ascites. 6. Tiny left lung base nodule. Not definitely present on prior exams. Recommend attention on follow-up.   Electronically Signed  By: Jeronimo Greaves M.D.   On: 08/18/2013 15:36   Dg Abd 2 Views  08/29/2013   CLINICAL DATA:  Metastatic colon cancer. New onset nausea and vomiting.  EXAM: ABDOMEN - 2 VIEW  COMPARISON:  CT abdomen pelvis 08/18/2013  FINDINGS: There are mildly prominent gas-filled loops of small bowel with associated air-fluid levels, most notable in the right abdomen. Gas is seen in the rectosigmoid colon.  IMPRESSION: Mild small bowel dilatation with associated air-fluid levels. Overall pattern can be seen with an early or partial small bowel obstruction.   Electronically Signed   By: Leanna Battles M.D.   On: 08/29/2013 10:20    Impression:  #1. Metastatic colon cancer Minimal response to FOLFOX. Allergic reaction to oxaliplatin. Plan: Change to FOLFIRI first dose today. Potential side effects reviewed at  length with patient and her husband. She is agreeable to proceed.  #2. Intermittent obstructive symptoms Signs and symptoms have subsided for now.   CC: Patient Care Team: Eustaquio Boyden, MD as PCP - General (Family Medicine) Ladene Artist, MD as Consulting Physician (Oncology) Cira Rue, RN as Registered Nurse   Levert Feinstein, MD 12/31/201410:03 AM

## 2013-08-31 NOTE — Patient Instructions (Signed)
[facility] Discharge Instructions for Patients Receiving Chemotherapy  Today you received the following chemotherapy agents :  Irinotecan, Leucovorin, Fluorouracil.  To help prevent nausea and vomiting after your treatment, we encourage you to take your nausea medication as instructed by your physician.  Take Zofran 4mg  by mouth every 8 hours as needed for nausea.  DO NOT Eat greasy nor spicy foods.  DO Drink lots of fluids as tolerated.     If you develop nausea and vomiting that is not controlled by your nausea medication, call the clinic.   BELOW ARE SYMPTOMS THAT SHOULD BE REPORTED IMMEDIATELY:  *FEVER GREATER THAN 100.5 F  *CHILLS WITH OR WITHOUT FEVER  NAUSEA AND VOMITING THAT IS NOT CONTROLLED WITH YOUR NAUSEA MEDICATION  *UNUSUAL SHORTNESS OF BREATH  *UNUSUAL BRUISING OR BLEEDING  TENDERNESS IN MOUTH AND THROAT WITH OR WITHOUT PRESENCE OF ULCERS  *URINARY PROBLEMS  *BOWEL PROBLEMS  UNUSUAL RASH Items with * indicate a potential emergency and should be followed up as soon as possible.  Feel free to call the clinic you have any questions or concerns. The clinic phone number is [phone].

## 2013-08-31 NOTE — Telephone Encounter (Signed)
appt for Kelly [name] made and printed...td

## 2013-09-02 ENCOUNTER — Ambulatory Visit (HOSPITAL_BASED_OUTPATIENT_CLINIC_OR_DEPARTMENT_OTHER): Payer: Medicare Other

## 2013-09-02 ENCOUNTER — Telehealth: Payer: Self-pay | Admitting: *Deleted

## 2013-09-02 VITALS — BP 127/57 | HR 99 | Resp 18

## 2013-09-02 DIAGNOSIS — C180 Malignant neoplasm of cecum: Secondary | ICD-10-CM

## 2013-09-02 DIAGNOSIS — C787 Secondary malignant neoplasm of liver and intrahepatic bile duct: Secondary | ICD-10-CM

## 2013-09-02 DIAGNOSIS — Z5189 Encounter for other specified aftercare: Secondary | ICD-10-CM

## 2013-09-02 DIAGNOSIS — C786 Secondary malignant neoplasm of retroperitoneum and peritoneum: Secondary | ICD-10-CM

## 2013-09-02 DIAGNOSIS — C189 Malignant neoplasm of colon, unspecified: Secondary | ICD-10-CM

## 2013-09-02 MED ORDER — SODIUM CHLORIDE 0.9 % IJ SOLN
10.0000 mL | INTRAMUSCULAR | Status: DC | PRN
  Administered 2013-09-02: 10 mL
  Filled 2013-09-02: qty 10

## 2013-09-02 MED ORDER — HEPARIN SOD (PORK) LOCK FLUSH 100 UNIT/ML IV SOLN
500.0000 [IU] | Freq: Once | INTRAVENOUS | Status: AC | PRN
  Administered 2013-09-02: 500 [IU]
  Filled 2013-09-02: qty 5

## 2013-09-02 MED ORDER — PEGFILGRASTIM INJECTION 6 MG/0.6ML
6.0000 mg | Freq: Once | SUBCUTANEOUS | Status: AC
  Administered 2013-09-02: 6 mg via SUBCUTANEOUS
  Filled 2013-09-02: qty 0.6

## 2013-09-02 NOTE — Telephone Encounter (Signed)
Pt states this chemo "has been like night and day". States she is not having the fatigue she was with the other chemo. Was able to make homemade soup, wash 5 loads of clothes and cleaned bathrooms. Is very pleased with the new chemo!

## 2013-09-11 ENCOUNTER — Other Ambulatory Visit: Payer: Self-pay | Admitting: Oncology

## 2013-09-13 ENCOUNTER — Other Ambulatory Visit: Payer: Self-pay

## 2013-09-14 ENCOUNTER — Telehealth: Payer: Self-pay | Admitting: *Deleted

## 2013-09-14 ENCOUNTER — Telehealth: Payer: Self-pay | Admitting: Oncology

## 2013-09-14 ENCOUNTER — Ambulatory Visit: Payer: Medicare Other | Admitting: Nutrition

## 2013-09-14 ENCOUNTER — Ambulatory Visit (HOSPITAL_BASED_OUTPATIENT_CLINIC_OR_DEPARTMENT_OTHER): Payer: Medicare Other | Admitting: Oncology

## 2013-09-14 ENCOUNTER — Other Ambulatory Visit (HOSPITAL_BASED_OUTPATIENT_CLINIC_OR_DEPARTMENT_OTHER): Payer: Medicare Other

## 2013-09-14 ENCOUNTER — Ambulatory Visit (HOSPITAL_BASED_OUTPATIENT_CLINIC_OR_DEPARTMENT_OTHER): Payer: Medicare Other

## 2013-09-14 VITALS — BP 122/56 | HR 107 | Temp 98.0°F | Resp 18 | Ht 65.0 in | Wt 144.5 lb

## 2013-09-14 DIAGNOSIS — R109 Unspecified abdominal pain: Secondary | ICD-10-CM

## 2013-09-14 DIAGNOSIS — R630 Anorexia: Secondary | ICD-10-CM

## 2013-09-14 DIAGNOSIS — C189 Malignant neoplasm of colon, unspecified: Secondary | ICD-10-CM

## 2013-09-14 DIAGNOSIS — F329 Major depressive disorder, single episode, unspecified: Secondary | ICD-10-CM

## 2013-09-14 DIAGNOSIS — D702 Other drug-induced agranulocytosis: Secondary | ICD-10-CM

## 2013-09-14 DIAGNOSIS — C787 Secondary malignant neoplasm of liver and intrahepatic bile duct: Secondary | ICD-10-CM

## 2013-09-14 DIAGNOSIS — C180 Malignant neoplasm of cecum: Secondary | ICD-10-CM

## 2013-09-14 DIAGNOSIS — R112 Nausea with vomiting, unspecified: Secondary | ICD-10-CM

## 2013-09-14 DIAGNOSIS — Z5111 Encounter for antineoplastic chemotherapy: Secondary | ICD-10-CM

## 2013-09-14 DIAGNOSIS — R6881 Early satiety: Secondary | ICD-10-CM

## 2013-09-14 DIAGNOSIS — F3289 Other specified depressive episodes: Secondary | ICD-10-CM

## 2013-09-14 LAB — COMPREHENSIVE METABOLIC PANEL (CC13)
ALBUMIN: 3.2 g/dL — AB (ref 3.5–5.0)
ALT: 10 U/L (ref 0–55)
AST: 20 U/L (ref 5–34)
Alkaline Phosphatase: 106 U/L (ref 40–150)
Anion Gap: 12 mEq/L — ABNORMAL HIGH (ref 3–11)
BUN: 9.4 mg/dL (ref 7.0–26.0)
CALCIUM: 9.3 mg/dL (ref 8.4–10.4)
CHLORIDE: 101 meq/L (ref 98–109)
CO2: 27 mEq/L (ref 22–29)
Creatinine: 0.6 mg/dL (ref 0.6–1.1)
Glucose: 102 mg/dl (ref 70–140)
POTASSIUM: 3.1 meq/L — AB (ref 3.5–5.1)
Sodium: 140 mEq/L (ref 136–145)
Total Bilirubin: 0.52 mg/dL (ref 0.20–1.20)
Total Protein: 7 g/dL (ref 6.4–8.3)

## 2013-09-14 LAB — CBC WITH DIFFERENTIAL/PLATELET
BASO%: 0.2 % (ref 0.0–2.0)
Basophils Absolute: 0 10*3/uL (ref 0.0–0.1)
EOS%: 0.6 % (ref 0.0–7.0)
Eosinophils Absolute: 0.1 10*3/uL (ref 0.0–0.5)
HCT: 38.6 % (ref 34.8–46.6)
HGB: 12.2 g/dL (ref 11.6–15.9)
LYMPH#: 2.2 10*3/uL (ref 0.9–3.3)
LYMPH%: 24.3 % (ref 14.0–49.7)
MCH: 28.4 pg (ref 25.1–34.0)
MCHC: 31.6 g/dL (ref 31.5–36.0)
MCV: 90 fL (ref 79.5–101.0)
MONO#: 0.7 10*3/uL (ref 0.1–0.9)
MONO%: 7.8 % (ref 0.0–14.0)
NEUT%: 67.1 % (ref 38.4–76.8)
NEUTROS ABS: 6 10*3/uL (ref 1.5–6.5)
Platelets: 285 10*3/uL (ref 145–400)
RBC: 4.29 10*6/uL (ref 3.70–5.45)
RDW: 18.2 % — AB (ref 11.2–14.5)
WBC: 8.9 10*3/uL (ref 3.9–10.3)

## 2013-09-14 MED ORDER — ATROPINE SULFATE 1 MG/ML IJ SOLN
INTRAMUSCULAR | Status: AC
  Filled 2013-09-14: qty 1

## 2013-09-14 MED ORDER — LEUCOVORIN CALCIUM INJECTION 350 MG
400.0000 mg/m2 | Freq: Once | INTRAVENOUS | Status: AC
  Administered 2013-09-14: 724 mg via INTRAVENOUS
  Filled 2013-09-14: qty 36.2

## 2013-09-14 MED ORDER — IRINOTECAN HCL CHEMO INJECTION 100 MG/5ML
180.0000 mg/m2 | Freq: Once | INTRAVENOUS | Status: AC
  Administered 2013-09-14: 326 mg via INTRAVENOUS
  Filled 2013-09-14: qty 16.3

## 2013-09-14 MED ORDER — FLUOROURACIL CHEMO INJECTION 2.5 GM/50ML
400.0000 mg/m2 | Freq: Once | INTRAVENOUS | Status: AC
  Administered 2013-09-14: 700 mg via INTRAVENOUS
  Filled 2013-09-14: qty 14

## 2013-09-14 MED ORDER — ONDANSETRON 16 MG/50ML IVPB (CHCC)
16.0000 mg | Freq: Once | INTRAVENOUS | Status: AC
  Administered 2013-09-14: 16 mg via INTRAVENOUS

## 2013-09-14 MED ORDER — METOCLOPRAMIDE HCL 5 MG PO TABS: 5.0000 mg | ORAL_TABLET | Freq: Three times a day (TID) | ORAL | Status: DC

## 2013-09-14 MED ORDER — ATROPINE SULFATE 1 MG/ML IJ SOLN
0.5000 mg | Freq: Once | INTRAMUSCULAR | Status: AC | PRN
  Administered 2013-09-14: 0.5 mg via INTRAVENOUS

## 2013-09-14 MED ORDER — SODIUM CHLORIDE 0.9 % IV SOLN
Freq: Once | INTRAVENOUS | Status: AC
  Administered 2013-09-14: 11:00:00 via INTRAVENOUS

## 2013-09-14 MED ORDER — DEXAMETHASONE SODIUM PHOSPHATE 20 MG/5ML IJ SOLN
20.0000 mg | Freq: Once | INTRAMUSCULAR | Status: AC
  Administered 2013-09-14: 20 mg via INTRAVENOUS

## 2013-09-14 MED ORDER — SODIUM CHLORIDE 0.9 % IV SOLN
2400.0000 mg/m2 | INTRAVENOUS | Status: DC
  Administered 2013-09-14: 4350 mg via INTRAVENOUS
  Filled 2013-09-14: qty 87

## 2013-09-14 MED ORDER — DEXAMETHASONE SODIUM PHOSPHATE 20 MG/5ML IJ SOLN
INTRAMUSCULAR | Status: AC
  Filled 2013-09-14: qty 5

## 2013-09-14 MED ORDER — ONDANSETRON 16 MG/50ML IVPB (CHCC)
INTRAVENOUS | Status: AC
  Filled 2013-09-14: qty 16

## 2013-09-14 NOTE — Progress Notes (Signed)
   [place]    OFFICE PROGRESS NOTE   INTERVAL HISTORY:   She returns for scheduled followup of metastatic colon cancer. Kelly [name] completed a first cycle of FOLFIRI 08/31/2013. She had a sore throat at the end of the irinotecan infusion. This lasted approximately 10 minutes. No mouth sores, nausea/vomiting, or diarrhea. She noted an improved energy level following the FOLFIRI compared to the FOLFOX.  She continues to have early satiety. No vomiting. Her bowels are moving daily. No pain.  Objective:  Vital signs in last 24 hours:  Blood pressure 122/56, pulse 107, temperature 98 F (36.7 C), temperature source Oral, resp. rate 18, height $RemoveBe'5\' 5"'hzwuCCsRt$  (1.651 m), weight 144 lb 8 oz (65.545 kg), SpO2 97.00%.    HEENT: Partial alopecia, no thrush or ulcers Resp: Lungs clear bilaterally Cardio: Regular rate and rhythm GI: Mildly distended in the upper abdomen, nontender, no mass, no hepatomegaly Vascular: No leg edema    Portacath/PICC-without erythema  Lab Results:  Lab Results  Component Value Date   WBC 8.9 09/14/2013   HGB 12.2 09/14/2013   HCT 38.6 09/14/2013   MCV 90.0 09/14/2013   PLT 285 09/14/2013   NEUTROABS 6.0 09/14/2013      Medications: I have reviewed the patient's current medications.  Assessment/Plan: 1. Stage IV colon cancer, colonoscopy 06/02/2013 found a mass at the cecum with a biopsy confirming adenocarcinoma positive K-ras mutation, no loss of expression of mismatch repair proteins, microsatellite stable  Cycle 1 FOLFOX 06/15/2013  Cycle 2 FOLFOX/Avastin 06/29/2013  Cycle 3 FOLFOX/Avastin 07/21/2013  Cycle 4 FOLFOX/Avastin 08/03/2013 CT 08/18/2013 with mild improvement of hepatic metastases and peritoneal/omental metastases, the distal ileum dilatation. Treatment was switched to FOLFIRI secondary to persistent obstructive symptoms and an allergic reaction to oxaliplatin Cycle 1 of FOLFIRI 08/31/2013 2. 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 Acute nausea/vomiting 06/08/2013-? Related to partial obstruction-improved 4. History of an arrhythmia  5. Depression -now taking Remeron, improved  6. Anorexia -persistent  7. Port-A-Cath placement 06/14/2013  8. neutropenia secondary chemotherapy, Neulasta was added with cycle 2 FOLFOX  9. Allergic reaction at the completion of the oxaliplatin infusion with cycle 4  10. Early satiety-likely related to gastroparesis/ileus, we decided to add Reglan   Disposition:  Her overall performance status appears improved. She has completed one cycle of FOLFIRI. She tolerated the FOLFIRI without significant acute toxicity. 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   We decided to begin a trial of Reglan. We discussed the potential toxicities associated with Reglan including the chance for cardiac dyskinesia. She will eat a small meals 6 times per day. I encouraged her to use nutrition supplements.  Kelly [name] will complete a second cycle of FOLFIRI today. She will return for an office visit and chemotherapy in 2 weeks.  The Avastin remains on hold. She will see Dr. Dalbert Batman on 09/15/2013 to establish care and discuss surgical treatment options. She will discuss the indication for advancing her diet with Dr. Clayburn Pert, Kelly Severin, Kelly [name]  09/14/2013  11:22 AM

## 2013-09-14 NOTE — Telephone Encounter (Signed)
gv adn printed appt sched and avs for pt for Jan adn Feb....sed added tsx.

## 2013-09-14 NOTE — Telephone Encounter (Signed)
Instructed patient to increase her potassium to twice daily due to K+ level of 3.1. She understands and agrees to change. Suggested she put tablet in tablespoon of applesauce to ease administration. She will try this.

## 2013-09-14 NOTE — Patient Instructions (Signed)
[facility] Discharge Instructions for Patients Receiving Chemotherapy  Today you received the following chemotherapy agents Irinotecan/Leucovorin/5FU.  To help prevent nausea and vomiting after your treatment, we encourage you to take your nausea medication as prescribed.  If you develop nausea and vomiting that is not controlled by your nausea medication, call the clinic.   BELOW ARE SYMPTOMS THAT SHOULD BE REPORTED IMMEDIATELY:  *FEVER GREATER THAN 100.5 F  *CHILLS WITH OR WITHOUT FEVER  NAUSEA AND VOMITING THAT IS NOT CONTROLLED WITH YOUR NAUSEA MEDICATION  *UNUSUAL SHORTNESS OF BREATH  *UNUSUAL BRUISING OR BLEEDING  TENDERNESS IN MOUTH AND THROAT WITH OR WITHOUT PRESENCE OF ULCERS  *URINARY PROBLEMS  *BOWEL PROBLEMS  UNUSUAL RASH Items with * indicate a potential emergency and should be followed up as soon as possible.  Feel free to call the clinic you have any questions or concerns. The clinic phone number is [phone].    

## 2013-09-14 NOTE — Progress Notes (Signed)
Nutrition followup completed with patient and husband in chemotherapy.  Patient is receiving chemotherapy for her metastatic colon cancer.  Weight continues to drop, and was documented as 144.5 pounds January 14 from 150.3 pounds December 31.  Patient is not concerned with weight loss.  She has no appetite.  She is frustrated with people telling her to eat.  She continues to complain of early satiety.  Noted Reglan ordered.  She verbalizes desire to increase the foods in her diet to include vegetables.  She denies nausea.  Patient states that she enjoys resource breeze, however, did not purchase because there were too many containers in the case.  Nutrition diagnosis: Unintended weight loss and inadequate oral intake.  Both continue.  Intervention: I again encouraged patient to eat small, frequent snacks every 2 hours daily.  She is encouraged to do this regardless of feeling hungry.  I educated patient on the importance of complying with medical nutrition therapy.  Patient has good knowledge of strategies however, she just does not want to eat.  I prepared a chocolate milk shake for patient which she drank during her treatment today.  I have recommended patient purchase resource breeze and consume at least 2 daily.  Teach back method used.  It is doubtful patient will increase oral intake without an increase in appetite.  Monitoring, evaluation, goals: Patient is tolerating a full liquid diet with some soft foods.  However has not been increasing/consuming oral nutrition supplements.  She has had continued weight loss.  Next visit: Wednesday, February 11, during chemotherapy.

## 2013-09-14 NOTE — Telephone Encounter (Signed)
Message copied by Tania Ade on Wed Sep 14, 2013  4:19 PM ------      Message from: Betsy Coder B      Created: Wed Sep 14, 2013  3:12 PM       Please call patient, increase kcl to bid ------

## 2013-09-15 ENCOUNTER — Encounter (INDEPENDENT_AMBULATORY_CARE_PROVIDER_SITE_OTHER): Payer: Self-pay | Admitting: General Surgery

## 2013-09-15 ENCOUNTER — Ambulatory Visit (INDEPENDENT_AMBULATORY_CARE_PROVIDER_SITE_OTHER): Payer: Medicare Other | Admitting: General Surgery

## 2013-09-15 VITALS — BP 120/64 | HR 65 | Resp 18 | Ht 64.75 in | Wt 146.0 lb

## 2013-09-15 DIAGNOSIS — C189 Malignant neoplasm of colon, unspecified: Secondary | ICD-10-CM

## 2013-09-15 DIAGNOSIS — K56609 Unspecified intestinal obstruction, unspecified as to partial versus complete obstruction: Secondary | ICD-10-CM

## 2013-09-15 DIAGNOSIS — C787 Secondary malignant neoplasm of liver and intrahepatic bile duct: Secondary | ICD-10-CM

## 2013-09-15 DIAGNOSIS — K56600 Partial intestinal obstruction, unspecified as to cause: Secondary | ICD-10-CM | POA: Insufficient documentation

## 2013-09-15 NOTE — Progress Notes (Signed)
Patient ID: Kelly [name], female   DOB: 1941/08/03, 73 y.o.   MRN: [id number]  Chief Complaint  Patient presents with  . New Evaluation    colon    HPI Kelly [name] is a 73 y.o. female.  She is referred by Dr. Julieanne Manson for a surgical oncology opinion regarding stage IV colon cancer with probable, intermittent, low-grade, malignant small bowel obstruction. She is [name]'s mother-in-law. [name] is her cardiologist. [name] is her gastroenterologist. [name] is her PCP.   This past summer she began complaining of intermittent suprapubic discomfort without any other significant symptoms other than occasional alternating diarrhea and constipation. She was initially diagnosed with a urinary tract infection but she continued to have discomfort and began developing early satiety, decreased food intake and some weight loss. On 06/02/2013 she underwent screening colonoscopy which revealed a 6 x 6 cm neoplastic mass in the right colon near the ileocecal valve. The anatomy of the cecum, appendiceal orifice, and ileocecal valve were actually distorted and not well seen. Biopsy showed adenocarcinoma. Subsequent CT scan showed numerous bilobar hepatic metastasis. There were also some peritoneal and omental metastasis and thickening of the cecum consistent with a cecal mass.   She completed 4 cycles of FOLFOX/Avastin on 08/03/2013. She began having intermittent episodes of vomiting in December. She continued to have bowel movements. She stated that she did not have any pain but has very loud rumbling bowel sounds. She had an allergic reaction to oxaliplatin after cycle 4. She developed the obstructive symptoms shortly thereafter. A followup CT scan was performed on 08/18/2013. This shows mild improvement of hepatic metastasis. Slight improvement in peritoneal and omental metastasis. The dominant mass off the inferior aspect of the cecum was slightly smaller. There was a  serosal metastasis in the right mid abdomen somewhat near the cecum. There is mild mid ileal dilatation with a decompressed terminal ileal loop was in the region of the serosal disease, suggesting a partial SBO. Contrast immediately went through into the colon.  Avastin has been held. Chemotherapy was changed to FOLFIRI (irinotecan). She is taking Senokot, 4 tablets per day. She has seen a nutritionist and is staying on a low fiber diet. For the past 2 weeks she has felt much better. She is tolerating a diet, although still has early satiety is trying to drink more water and follow 6 small feedings per day. She denies pain nausea or vomiting. She has lost 30 pounds in the past 6 months. Recent albumin is 3.2.LFTs are normal.  Comorbidities include PACs, followed by [name]. History of rheumatic fever as a child. GERD. History TAH and BSO. History ruptured endometrioma with laparotomy for that.  She is here today for her first surgical oncology consultation.Her husband and daughter are with her throughout the encounter. HPI  Past Medical History  Diagnosis Date  . Premature ventricular contractions   . Palpitations     occasional  . Hyperlipidemia   . Osteoarthritis   . Anxiety and depression   . Chronic insomnia   . Osteopenia 01/2012  . Diverticulosis 01/2013    by CT  . DDD (degenerative disc disease), lumbar 01/2013    by CT (L5 B pars defects with 1cm slip, L5/S1 DDD with prominent B foraminal narrowing with mass effect on exiting L5 nerve root)  . GERD (gastroesophageal reflux disease)   . Asthma 06-01-13    no use of rescue inhalers in many years    Past  Surgical History  Procedure Laterality Date  . Nsvd      x2  . Btl    . Total abdominal hysterectomy      due to endometriosis w endmetrioma  . Appendectomy    . Stress cadrio lite  04/26/1999    normal ef 76%  . Dexa osteopenia fem neck, spine  08/22/2002  . Abd/ultra sound within normal limits  05/08/2004  . Stress  myoview  05/15/2006    normal  . L knee arthroscopy (other)  09/25/2008    Dr. Lorre Munroe  . R knee arthroscopy (other)  06/26/2009    lots of arthritis Dr. Mauri Pole  . Dexa  01/2012    osteopenia (T score femur -1.9, forearm -1.5)  . Breast surgery Left 06-01-13    Left breast "chip" implanted as marker(none malignant)  . Colonoscopy N/A 06/02/2013    Procedure: COLONOSCOPY;  Surgeon: Jerene Bears, MD;  Location: [facility];  Service: Gastroenterology;  Laterality: N/A;    Family History  Problem Relation Age of Onset  . Heart attack Mother   . Aortic stenosis Mother   . Heart disease Mother     MI, aortic stenosis, pacer  . Parkinsonism Father   . Osteoporosis Sister   . Arthritis Sister   . Breast cancer Maternal Aunt   . Stroke Neg Hx   . Drug abuse Neg Hx   . Depression Neg Hx   . Ovarian cancer Neg Hx   . Colon cancer Neg Hx     Social History History  Substance Use Topics  . Smoking status: Never Smoker   . Smokeless tobacco: Never Used  . Alcohol Use: No    Allergies  Allergen Reactions  . Adhesive [Tape] Other (See Comments)    Tears skin  . Amoxicillin     REACTION: RASH  . Clarithromycin     REACTION: GI UPSET  . Codeine Nausea Only and Palpitations    Current Outpatient Prescriptions  Medication Sig Dispense Refill  . acetaminophen (TYLENOL) 500 MG tablet Take 500 mg by mouth every 6 (six) hours as needed for pain.      Marland Kitchen ALPRAZolam (XANAX) 0.25 MG tablet Take 0.5 tablets (0.125 mg total) by mouth 2 (two) times daily as needed for anxiety.  60 tablet  0  . glycerin, Pediatric, 1.2 G SUPP Place 1 suppository rectally daily as needed. constapation      . hyoscyamine (LEVSIN SL) 0.125 MG SL tablet Place 0.125 mg under the tongue every 6 (six) hours as needed for cramping.      . lidocaine-prilocaine (EMLA) cream Apply topically to port a cath site one hour prior to use. Do not rub in. Cover with plastic.  30 g  1  . loratadine (CLARITIN) 10 MG tablet Take 10  mg by mouth daily as needed.       . metoCLOPramide (REGLAN) 5 MG tablet Take 1 tablet (5 mg total) by mouth 3 (three) times daily before meals.  90 tablet  1  . mirtazapine (REMERON) 15 MG tablet Take 1 tablet (15 mg total) by mouth at bedtime.  30 tablet  5  . omeprazole (PRILOSEC) 20 MG capsule Take 20 mg by mouth daily. Will take bid as needed (for allergies)      . ondansetron (ZOFRAN ODT) 4 MG disintegrating tablet Take 1 tablet (4 mg total) by mouth every 8 (eight) hours as needed for nausea or vomiting.  20 tablet  2  . ondansetron (ZOFRAN) 4  MG tablet Take 1 tablet (4 mg total) by mouth every 6 (six) hours as needed for nausea.  60 tablet  0  . polyethylene glycol (MIRALAX / GLYCOLAX) packet Take 17 g by mouth daily as needed.       . potassium chloride SA (K-DUR,KLOR-CON) 20 MEQ tablet Take 1 tablet (20 mEq total) by mouth daily. Take twice daily on 06/29/13, then one by mouth daily  31 tablet  2  . sennosides-docusate sodium (SENOKOT-S) 8.6-50 MG tablet Take 1 tablet by mouth 2 (two) times daily as needed for constipation.      . traMADol (ULTRAM) 50 MG tablet Take 50-100 mg by mouth every 8 (eight) hours as needed for pain.      Marland Kitchen zolpidem (AMBIEN) 10 MG tablet TAKE ONE TABLET BY MOUTH NIGHTLY AT BEDTIME AS NEEDED   30 tablet  0   No current facility-administered medications for this visit.    Review of Systems Review of Systems  Constitutional: Positive for unexpected weight change. Negative for fever and chills.  HENT: Negative for congestion, hearing loss, sore throat, trouble swallowing and voice change.   Eyes: Negative for visual disturbance.  Respiratory: Negative for cough and wheezing.   Cardiovascular: Negative for chest pain, palpitations and leg swelling.  Gastrointestinal: Positive for nausea, vomiting and diarrhea. Negative for abdominal pain, constipation, blood in stool, abdominal distention and anal bleeding.  Genitourinary: Negative for hematuria, vaginal bleeding  and difficulty urinating.  Musculoskeletal: Negative for arthralgias.  Skin: Negative for rash and wound.  Neurological: Negative for seizures, syncope and headaches.  Hematological: Negative for adenopathy. Does not bruise/bleed easily.  Psychiatric/Behavioral: Negative for confusion. The patient is nervous/anxious.     Blood pressure 120/64, pulse 65, resp. rate 18, height 5' 4.75" (1.645 m), weight 146 lb (66.225 kg).  Physical Exam Physical Exam  Constitutional: She is oriented to person, place, and time. She appears well-developed and well-nourished. No distress.  She looks very good. Well-dressed. Alert. Friendly, appropriate and oriented. She does not appear deconditioned at all.  HENT:  Head: Normocephalic and atraumatic.  Nose: Nose normal.  Mouth/Throat: No oropharyngeal exudate.  Eyes: Conjunctivae and EOM are normal. Pupils are equal, round, and reactive to light. Left eye exhibits no discharge. No scleral icterus.  Neck: Neck supple. No JVD present. No tracheal deviation present. No thyromegaly present.  Cardiovascular: Normal rate, regular rhythm, normal heart sounds and intact distal pulses.   No murmur heard. Pulmonary/Chest: Effort normal and breath sounds normal. No respiratory distress. She has no wheezes. She has no rales. She exhibits no tenderness.  Abdominal: Soft. Bowel sounds are normal. She exhibits no distension and no mass. There is no tenderness. There is no rebound and no guarding.    Abdomen is generally soft. Her bowels are normoactive. No rushes or tinkles. Not tympanitic. Not particularly tender. Lower midline scar. Right lower quadrant scar. No hernias. No inguinal adenopathy.  Musculoskeletal: She exhibits no edema and no tenderness.  Lymphadenopathy:    She has no cervical adenopathy.  Neurological: She is alert and oriented to person, place, and time. She exhibits normal muscle tone. Coordination normal.  Skin: Skin is warm. No rash noted. She is  not diaphoretic. No erythema. No pallor.  Psychiatric: She has a normal mood and affect. Her behavior is normal. Judgment and thought content normal.    Data Reviewed Colonoscopy report and pathology. All CT scans and imaging studies. Dr. Elsworth Soho notes. I had a conversation with Dr. Benay Spice.  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 Currently she is tolerating this.  History of TAH and BSO  History ruptured endometrioma, were resected through right lower quadrant scar  History PACs  Remote history of rheumatic fever with no known valvular disease    Plan    We had a very long discussion. At this point in time I told her that I would prefer to continue systemic therapy, and hold off on surgical intervention, in hopes of reducing her tumor burden in her liver, cecum, and her serosal metastasis. I told her it was possible that if the current chemotherapy regimen was effective, this might treat her obstruction and allow her to resume normal diet, although I am not sure. Alternatively, if she develops progressive obstructive symptoms, she will need to be considered for surgical intervention.  I told her that it would be reasonable for Dr. Benay Spice to resume the Avastin as well, if he feels that is really  important . Marland Kitchen She is aware of the risk of spontaneous perforation as well as the risk of poor wound healing if we have to intervene surgically emergently.  I told her that if she had an excellent response, that there may be a role for resection of the primary tumor and possibly the peritoneal disease in the future. I advised her to discuss timing of followup CT scan following her current cycles of FOLFIRI.  She is aware that she may develop obstructive symptoms, and that she is to report any pain or vomiting since this may require surgical intervention on a more urgent basis, despite her  chemotherapy regimen.   We talked the role of surgical followup. We decided that I would see her as needed based on symptoms or at the request of Dr. Benay Spice.       Edsel Petrin. Dalbert Batman, M.D., [facility] General and Minimally invasive Surgery Breast and Colorectal Surgery Office:   [phone] Pager:   [phone]  09/15/2013, 12:06 PM

## 2013-09-15 NOTE — Patient Instructions (Signed)
You have colon cancer of the right colon that is metastatic to the liver.  In addition to this, you have had symptoms and radiographic findings suggesting a partial small bowel obstruction. The most likely cause for this is the tumor involving the mid ileum of the small bowel. It is also possible that there is a partial obstruction where the small bowel enters the large intestine in the right lower quadrant.  With the change in chemotherapy and the change in diet, your vomiting and pain have resolved, although you still do not have a good appetite And you have lost some weight.  Your  physical exam today is normal. I do not feel any tumor or enlarged lymph nodes.  My advice right now is for you to continue her chemotherapy, and to allow Dr. Benay Spice to start the avastin back. .  At some point in time, Dr. Benay Spice will need to perform another CT scan to make sure that you are responding to the chemotherapy.  If you develop obstructive symptoms again, such as pain or vomiting, please report these immediately. You may require surgery if this happens.  We talked about surgical followup, and we agreed that you will return to see me as needed, and that you can call me any time with any concern or discussion.  Please try to drink as much water as possible, and continue to eat 6 small meals a day. A multivitamin daily would also possibly be helpful.

## 2013-09-16 ENCOUNTER — Ambulatory Visit (HOSPITAL_BASED_OUTPATIENT_CLINIC_OR_DEPARTMENT_OTHER): Payer: Medicare Other

## 2013-09-16 VITALS — BP 117/79 | HR 98 | Temp 98.3°F | Resp 18

## 2013-09-16 DIAGNOSIS — C180 Malignant neoplasm of cecum: Secondary | ICD-10-CM

## 2013-09-16 DIAGNOSIS — C189 Malignant neoplasm of colon, unspecified: Secondary | ICD-10-CM

## 2013-09-16 DIAGNOSIS — Z5189 Encounter for other specified aftercare: Secondary | ICD-10-CM

## 2013-09-16 DIAGNOSIS — C787 Secondary malignant neoplasm of liver and intrahepatic bile duct: Secondary | ICD-10-CM

## 2013-09-16 MED ORDER — PEGFILGRASTIM INJECTION 6 MG/0.6ML
6.0000 mg | Freq: Once | SUBCUTANEOUS | Status: AC
  Administered 2013-09-16: 6 mg via SUBCUTANEOUS
  Filled 2013-09-16: qty 0.6

## 2013-09-16 MED ORDER — HEPARIN SOD (PORK) LOCK FLUSH 100 UNIT/ML IV SOLN
500.0000 [IU] | Freq: Once | INTRAVENOUS | Status: AC | PRN
  Administered 2013-09-16: 500 [IU]
  Filled 2013-09-16: qty 5

## 2013-09-16 MED ORDER — SODIUM CHLORIDE 0.9 % IJ SOLN
10.0000 mL | INTRAMUSCULAR | Status: DC | PRN
  Administered 2013-09-16: 10 mL
  Filled 2013-09-16: qty 10

## 2013-09-16 NOTE — Patient Instructions (Signed)

## 2013-09-21 ENCOUNTER — Emergency Department (HOSPITAL_COMMUNITY): Payer: Medicare Other

## 2013-09-21 ENCOUNTER — Telehealth: Payer: Self-pay

## 2013-09-21 ENCOUNTER — Emergency Department (HOSPITAL_COMMUNITY)
Admission: EM | Admit: 2013-09-21 | Discharge: 2013-09-21 | Disposition: A | Discharge status: Home / Self Care | Payer: Medicare Other | Source: Home / Self Care | Attending: Emergency Medicine | Admitting: Emergency Medicine

## 2013-09-21 ENCOUNTER — Encounter (HOSPITAL_COMMUNITY): Payer: Self-pay | Admitting: Emergency Medicine

## 2013-09-21 DIAGNOSIS — C785 Secondary malignant neoplasm of large intestine and rectum: Secondary | ICD-10-CM

## 2013-09-21 DIAGNOSIS — F411 Generalized anxiety disorder: Secondary | ICD-10-CM

## 2013-09-21 DIAGNOSIS — K219 Gastro-esophageal reflux disease without esophagitis: Secondary | ICD-10-CM

## 2013-09-21 DIAGNOSIS — Z90710 Acquired absence of both cervix and uterus: Secondary | ICD-10-CM

## 2013-09-21 DIAGNOSIS — R55 Syncope and collapse: Secondary | ICD-10-CM

## 2013-09-21 DIAGNOSIS — F329 Major depressive disorder, single episode, unspecified: Secondary | ICD-10-CM

## 2013-09-21 DIAGNOSIS — Z8739 Personal history of other diseases of the musculoskeletal system and connective tissue: Secondary | ICD-10-CM | POA: Insufficient documentation

## 2013-09-21 DIAGNOSIS — Z862 Personal history of diseases of the blood and blood-forming organs and certain disorders involving the immune mechanism: Secondary | ICD-10-CM

## 2013-09-21 DIAGNOSIS — Z8639 Personal history of other endocrine, nutritional and metabolic disease: Secondary | ICD-10-CM

## 2013-09-21 DIAGNOSIS — C801 Malignant (primary) neoplasm, unspecified: Secondary | ICD-10-CM

## 2013-09-21 DIAGNOSIS — R000 Tachycardia, unspecified: Secondary | ICD-10-CM | POA: Insufficient documentation

## 2013-09-21 DIAGNOSIS — Z79899 Other long term (current) drug therapy: Secondary | ICD-10-CM | POA: Insufficient documentation

## 2013-09-21 DIAGNOSIS — Z9089 Acquired absence of other organs: Secondary | ICD-10-CM | POA: Insufficient documentation

## 2013-09-21 DIAGNOSIS — R112 Nausea with vomiting, unspecified: Secondary | ICD-10-CM

## 2013-09-21 DIAGNOSIS — J45909 Unspecified asthma, uncomplicated: Secondary | ICD-10-CM

## 2013-09-21 DIAGNOSIS — K56600 Partial intestinal obstruction, unspecified as to cause: Secondary | ICD-10-CM

## 2013-09-21 DIAGNOSIS — C799 Secondary malignant neoplasm of unspecified site: Secondary | ICD-10-CM

## 2013-09-21 DIAGNOSIS — K56609 Unspecified intestinal obstruction, unspecified as to partial versus complete obstruction: Secondary | ICD-10-CM | POA: Insufficient documentation

## 2013-09-21 DIAGNOSIS — F3289 Other specified depressive episodes: Secondary | ICD-10-CM | POA: Insufficient documentation

## 2013-09-21 LAB — CBC WITH DIFFERENTIAL/PLATELET
BASOS PCT: 0 % (ref 0–1)
Basophils Absolute: 0 10*3/uL (ref 0.0–0.1)
Eosinophils Absolute: 0.1 10*3/uL (ref 0.0–0.7)
Eosinophils Relative: 1 % (ref 0–5)
HCT: 39.4 % (ref 36.0–46.0)
HEMOGLOBIN: 12.8 g/dL (ref 12.0–15.0)
LYMPHS ABS: 1.4 10*3/uL (ref 0.7–4.0)
Lymphocytes Relative: 25 % (ref 12–46)
MCH: 29 pg (ref 26.0–34.0)
MCHC: 32.5 g/dL (ref 30.0–36.0)
MCV: 89.1 fL (ref 78.0–100.0)
MONO ABS: 0.6 10*3/uL (ref 0.1–1.0)
Monocytes Relative: 11 % (ref 3–12)
NEUTROS PCT: 63 % (ref 43–77)
Neutro Abs: 3.4 10*3/uL (ref 1.7–7.7)
RBC: 4.42 MIL/uL (ref 3.87–5.11)
RDW: 16.3 % — ABNORMAL HIGH (ref 11.5–15.5)
WBC: 5.5 10*3/uL (ref 4.0–10.5)

## 2013-09-21 LAB — TROPONIN I: Troponin I: <0.3 ng/mL (ref <0.30)

## 2013-09-21 LAB — COMPREHENSIVE METABOLIC PANEL
ALT: 14 U/L (ref 0–35)
AST: 20 U/L (ref 0–37)
Albumin: 3.5 g/dL (ref 3.5–5.2)
Alkaline Phosphatase: 119 U/L — ABNORMAL HIGH (ref 39–117)
BILIRUBIN TOTAL: 0.9 mg/dL (ref 0.3–1.2)
BUN: 13 mg/dL (ref 6–23)
CALCIUM: 8.9 mg/dL (ref 8.4–10.5)
CO2: 20 meq/L (ref 19–32)
Chloride: 97 mEq/L (ref 96–112)
Creatinine, Ser: 0.5 mg/dL (ref 0.50–1.10)
GFR calc Af Amer: >90 mL/min (ref >90)
Glucose, Bld: 141 mg/dL — ABNORMAL HIGH (ref 70–99)
Potassium: 3.9 mEq/L (ref 3.7–5.3)
SODIUM: 136 meq/L — AB (ref 137–147)
Total Protein: 7.3 g/dL (ref 6.0–8.3)

## 2013-09-21 LAB — LIPASE, BLOOD: LIPASE: 12 U/L (ref 11–59)

## 2013-09-21 MED ORDER — PROMETHAZINE HCL 25 MG PO TABS: 25.0000 mg | ORAL_TABLET | Freq: Four times a day (QID) | ORAL | Status: DC | PRN

## 2013-09-21 MED ORDER — PROMETHAZINE HCL 25 MG RE SUPP: 25.0000 mg | Freq: Four times a day (QID) | RECTAL | Status: DC | PRN

## 2013-09-21 MED ORDER — SODIUM CHLORIDE 0.9 % IV BOLUS (SEPSIS): 1000.0000 mL | Freq: Once | INTRAVENOUS | Status: DC

## 2013-09-21 MED ORDER — SODIUM CHLORIDE 0.9 % IV BOLUS (SEPSIS)
1000.0000 mL | Freq: Once | INTRAVENOUS | Status: AC
  Administered 2013-09-21: 1000 mL via INTRAVENOUS

## 2013-09-21 MED ORDER — SODIUM CHLORIDE 0.9 % IV SOLN
Freq: Once | INTRAVENOUS | Status: AC
  Administered 2013-09-21: 17:00:00 via INTRAVENOUS

## 2013-09-21 MED ORDER — HEPARIN SOD (PORK) LOCK FLUSH 100 UNIT/ML IV SOLN
500.0000 [IU] | INTRAVENOUS | Status: AC | PRN
  Administered 2013-09-21: 500 [IU]
  Filled 2013-09-21: qty 5

## 2013-09-21 MED ORDER — PROCHLORPERAZINE EDISYLATE 5 MG/ML IJ SOLN
10.0000 mg | Freq: Once | INTRAMUSCULAR | Status: AC
  Administered 2013-09-21: 10 mg via INTRAVENOUS
  Filled 2013-09-21: qty 2

## 2013-09-21 NOTE — Telephone Encounter (Signed)
Daughter Arby Barrette calling stating that her mother has had increased vomiting during the night and this am.  She is using the Zofran with minimal effect.  She stopped  using the Reglan per Paige as Dr. Dalbert Batman felt that it would not be effective and cause increased abdominal cramping.  It also caused her mouth to be very dry. Arby Barrette wants to know what Dr. Benay Spice recommends as Dr. Dalbert Batman said that her mother probably has intermittent low grade bowel obstructions and she  is concerned that she may be becoming totally obstructed. Told Paige that Dr. Benay Spice recommends going to a clear liquid diet-this has helped decrease symptoms in the past.   Use nausea medications as prescribed.  Call Dr. Gearldine Shown office tomorrow if not better and he will order some imaging studies to determine if she is obstructed.   If symptoms get worse, bring her mother to the ED. Daughter verbalized understanding.

## 2013-09-21 NOTE — Telephone Encounter (Signed)
Daughter, Arby Barrette called to report patient "looks terrible and is still vomiting clear liquids". Is taking her to [facility] emergency room. Wanted office aware. Forwarded info to collaborative nurse to make Dr. Benay Spice aware when he calls.

## 2013-09-21 NOTE — ED Provider Notes (Signed)
CSN: [id number]     Arrival date & time 09/21/13  1500 History   First MD Initiated Contact with Patient 09/21/13 1511     Chief Complaint  Patient presents with  . Emesis   (Consider location/radiation/quality/duration/timing/severity/associated sxs/prior Treatment) Patient is a 73 y.o. female presenting with vomiting. The history is provided by the patient.  Emesis Severity:  Moderate Associated symptoms: abdominal pain   Associated symptoms: no diarrhea and no headaches    patient presents with acute on chronic nausea vomiting. 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 She has a chronic partial small bowel obstruction. Over the last week or 2 she's had increased vomiting and has been switched to a clear liquid diet. It has been worse yesterday and today. No fevers. She had to, normal for her bowel movement yesterday. States her abdomen is a larger. No change her pain. Patient had a syncopal episode in the lobby. Past Medical History  Diagnosis Date  . Premature ventricular contractions   . Palpitations     occasional  . Hyperlipidemia   . Osteoarthritis   . Anxiety and depression   . Chronic insomnia   . Osteopenia 01/2012  . Diverticulosis 01/2013    by CT  . DDD (degenerative disc disease), lumbar 01/2013    by CT (L5 B pars defects with 1cm slip, L5/S1 DDD with prominent B foraminal narrowing with mass effect on exiting L5 nerve root)  . GERD (gastroesophageal reflux disease)   . Asthma 06-01-13    no use of rescue inhalers in many years   Past Surgical History  Procedure Laterality Date  . Nsvd      x2  . Btl    . Total abdominal hysterectomy      due to endometriosis w endmetrioma  . Appendectomy    . Stress cadrio lite  04/26/1999    normal ef 76%  . Dexa osteopenia fem neck, spine  08/22/2002  . Abd/ultra sound within normal limits  05/08/2004  . Stress myoview  05/15/2006    normal  . L knee arthroscopy (other)  09/25/2008    Dr. Lorre Munroe  . R knee  arthroscopy (other)  06/26/2009    lots of arthritis Dr. Mauri Pole  . Dexa  01/2012    osteopenia (T score femur -1.9, forearm -1.5)  . Breast surgery Left 06-01-13    Left breast "chip" implanted as marker(none malignant)  . Colonoscopy N/A 06/02/2013    Procedure: COLONOSCOPY;  Surgeon: Jerene Bears, MD;  Location: [facility];  Service: Gastroenterology;  Laterality: N/A;   Family History  Problem Relation Age of Onset  . Heart attack Mother   . Aortic stenosis Mother   . Heart disease Mother     MI, aortic stenosis, pacer  . Parkinsonism Father   . Osteoporosis Sister   . Arthritis Sister   . Breast cancer Maternal Aunt   . Stroke Neg Hx   . Drug abuse Neg Hx   . Depression Neg Hx   . Ovarian cancer Neg Hx   . Colon cancer Neg Hx    History  Substance Use Topics  . Smoking status: Never Smoker   . Smokeless tobacco: Never Used  . Alcohol Use: No   OB History   Grav Para Term Preterm Abortions TAB SAB Ect Mult Living                 Review of Systems  Constitutional: Negative for activity change and appetite change.  Eyes: Negative for pain.  Respiratory: Negative for chest tightness and shortness of breath.   Cardiovascular: Negative for chest pain and leg swelling.  Gastrointestinal: Positive for nausea, vomiting and abdominal pain. Negative for diarrhea.  Genitourinary: Negative for flank pain.  Musculoskeletal: Negative for back pain and neck stiffness.  Skin: Negative for rash.  Neurological: Negative for weakness, numbness and headaches.  Psychiatric/Behavioral: Negative for behavioral problems.    Allergies  Adhesive; Amoxicillin; Clarithromycin; and Codeine  Home Medications   Current Outpatient Rx  Name  Route  Sig  Dispense  Refill  . ALPRAZolam (XANAX) 0.25 MG tablet   Oral   Take 0.5 tablets (0.125 mg total) by mouth 2 (two) times daily as needed for anxiety.   60 tablet   0   . glycerin, Pediatric, 1.2 G SUPP   Rectal   Place 1 suppository  rectally daily as needed for mild constipation.          . hyoscyamine (LEVSIN SL) 0.125 MG SL tablet   Sublingual   Place 0.125 mg under the tongue every 6 (six) hours as needed for cramping.         . lidocaine-prilocaine (EMLA) cream      Apply topically to port a cath site one hour prior to use. Do not rub in. Cover with plastic.   30 g   1   . loratadine (CLARITIN) 10 MG tablet   Oral   Take 10 mg by mouth daily as needed for allergies.          . mirtazapine (REMERON) 15 MG tablet   Oral   Take 1 tablet (15 mg total) by mouth at bedtime.   30 tablet   5   . omeprazole (PRILOSEC) 20 MG capsule   Oral   Take 20 mg by mouth daily. Will take bid as needed (for allergies)         . ondansetron (ZOFRAN ODT) 4 MG disintegrating tablet   Oral   Take 1 tablet (4 mg total) by mouth every 8 (eight) hours as needed for nausea or vomiting.   20 tablet   2   . polyethylene glycol (MIRALAX / GLYCOLAX) packet   Oral   Take 17 g by mouth daily as needed for mild constipation.          . potassium chloride SA (K-DUR,KLOR-CON) 20 MEQ tablet   Oral   Take 1 tablet (20 mEq total) by mouth daily. Take twice daily on 06/29/13, then one by mouth daily   31 tablet   2   . PRESCRIPTION MEDICATION      See admin instructions. Chemotherapy at [facility] (Dr. Benay Spice), FOLFIRI regimen, last treatment 09/14/2013         . sennosides-docusate sodium (SENOKOT-S) 8.6-50 MG tablet   Oral   Take 1 tablet by mouth 2 (two) times daily as needed for constipation.         . traMADol (ULTRAM) 50 MG tablet   Oral   Take 50 mg by mouth every 8 (eight) hours as needed for pain.          Marland Kitchen zolpidem (AMBIEN) 10 MG tablet   Oral   Take 10 mg by mouth at bedtime as needed for sleep.         . promethazine (PHENERGAN) 25 MG suppository   Rectal   Place 1 suppository (25 mg total) rectally every 6 (six) hours as needed for nausea or vomiting.   20 each  0   . promethazine  (PHENERGAN) 25 MG tablet   Oral   Take 1 tablet (25 mg total) by mouth every 6 (six) hours as needed for nausea.   20 tablet   0    BP 122/53  Pulse 130  Temp(Src) 97.9 F (36.6 C) (Oral)  Resp 20  SpO2 98% Physical Exam  Nursing note and vitals reviewed. Constitutional: She is oriented to person, place, and time. She appears well-developed and well-nourished.  Patient appears chronically ill  HENT:  Head: Normocephalic and atraumatic.  Eyes: EOM are normal. Pupils are equal, round, and reactive to light.  Neck: Normal range of motion. Neck supple.  Cardiovascular: Normal rate, regular rhythm and normal heart sounds.   No murmur heard. Tachycardia  Pulmonary/Chest: Effort normal and breath sounds normal. No respiratory distress. She has no wheezes. She has no rales.  Abdominal: Soft. Bowel sounds are normal. She exhibits no distension. There is tenderness. There is no rebound and no guarding.  Mild diffuse tenderness, worse in right lower quadrant.  Musculoskeletal: Normal range of motion.  Neurological: She is alert and oriented to person, place, and time. No cranial nerve deficit.  Skin: Skin is warm and dry.  Psychiatric: She has a normal mood and affect. Her speech is normal.    ED Course  Procedures (including critical care time) Labs Review Labs Reviewed  CBC WITH DIFFERENTIAL - Abnormal; Notable for the following:    RDW 16.3 (*)    All other components within normal limits  COMPREHENSIVE METABOLIC PANEL - Abnormal; Notable for the following:    Sodium 136 (*)    Glucose, Bld 141 (*)    Alkaline Phosphatase 119 (*)    All other components within normal limits  TROPONIN I  LIPASE, BLOOD  URINALYSIS, ROUTINE W REFLEX MICROSCOPIC   Imaging Review Dg Abd Acute W/chest  09/21/2013   CLINICAL DATA:  Colon cancer and emesis.  EXAM: ACUTE ABDOMEN SERIES (ABDOMEN 2 VIEW & CHEST 1 VIEW)  COMPARISON:  08/29/2013  FINDINGS: Chest radiograph demonstrates a right jugular  Port-A-Cath. Catheter tip is at the junction of SVC and right atrium. Mild elevation of the right hemidiaphragm. Lungs are clear without airspace disease. Heart size is normal. No evidence for free air.  Nonspecific bowel gas pattern with gas in the colon. Few air-fluid levels in the abdomen. Stable calcification in the right lower abdomen is probably related to a phlebolith.  IMPRESSION: No acute chest findings.  Nonspecific bowel gas pattern.   Electronically Signed   By: Markus Daft M.D.   On: 09/21/2013 16:46    EKG Interpretation    Date/Time:  Wednesday September 21 2013 15:28:07 EST Ventricular Rate:  116 PR Interval:  160 QRS Duration: 73 QT Interval:  382 QTC Calculation: 531 R Axis:   90 Text Interpretation:  Sinus tachycardia Borderline right axis deviation Nonspecific T abnormalities, diffuse leads Prolonged QT interval mild ST flattening inferiorly and anteriorly Confirmed by Yazlin Ekblad  MD, Lynard Postlewait ([id number]) on 09/21/2013 3:34:57 PM            MDM   1. Nausea and vomiting   2. Partial small bowel obstruction   3. Metastatic cancer    Patient with nausea and vomiting with history of partial small bowel obstructions due to metastatic cancer. Lab work reassuring. She feels somewhat better after IV fluids and antiemetics. She's tolerated orals. Discussed with internal medicine, who is seen the patient in the ED. Patient feels somewhat better and will  be discharged home. She will be discharged    Jasper Riling. Alvino Chapel, MD 09/21/13 [phone]

## 2013-09-21 NOTE — ED Notes (Signed)
Pt undergoing chemo for colon ca with mets; saw surgery last wk and told surgery not needed at this time; today pt developed nausea/vomiting; no appetite; passed out in waiting room--assisted by family to wheelchair

## 2013-09-21 NOTE — Discharge Instructions (Signed)

## 2013-09-23 ENCOUNTER — Ambulatory Visit (HOSPITAL_BASED_OUTPATIENT_CLINIC_OR_DEPARTMENT_OTHER): Payer: Medicare Other | Admitting: Oncology

## 2013-09-23 ENCOUNTER — Other Ambulatory Visit: Payer: Self-pay | Admitting: *Deleted

## 2013-09-23 ENCOUNTER — Encounter (HOSPITAL_COMMUNITY): Payer: Self-pay

## 2013-09-23 ENCOUNTER — Observation Stay (HOSPITAL_COMMUNITY): Payer: Medicare Other

## 2013-09-23 ENCOUNTER — Inpatient Hospital Stay (HOSPITAL_COMMUNITY)
Admission: AD | Admit: 2013-09-23 | Discharge: 2013-10-06 | DRG: 329 | Disposition: A | Discharge status: Home Health | Payer: Medicare Other | Source: Ambulatory Visit | Attending: General Surgery | Admitting: General Surgery

## 2013-09-23 ENCOUNTER — Telehealth: Payer: Self-pay | Admitting: *Deleted

## 2013-09-23 ENCOUNTER — Other Ambulatory Visit (HOSPITAL_COMMUNITY): Payer: Self-pay | Admitting: Internal Medicine

## 2013-09-23 ENCOUNTER — Ambulatory Visit (HOSPITAL_BASED_OUTPATIENT_CLINIC_OR_DEPARTMENT_OTHER): Payer: Medicare Other

## 2013-09-23 DIAGNOSIS — K929 Disease of digestive system, unspecified: Secondary | ICD-10-CM | POA: Diagnosis not present

## 2013-09-23 DIAGNOSIS — R197 Diarrhea, unspecified: Secondary | ICD-10-CM | POA: Diagnosis present

## 2013-09-23 DIAGNOSIS — IMO0002 Reserved for concepts with insufficient information to code with codable children: Secondary | ICD-10-CM

## 2013-09-23 DIAGNOSIS — F411 Generalized anxiety disorder: Secondary | ICD-10-CM

## 2013-09-23 DIAGNOSIS — C189 Malignant neoplasm of colon, unspecified: Principal | ICD-10-CM | POA: Diagnosis present

## 2013-09-23 DIAGNOSIS — R109 Unspecified abdominal pain: Secondary | ICD-10-CM

## 2013-09-23 DIAGNOSIS — C801 Malignant (primary) neoplasm, unspecified: Secondary | ICD-10-CM

## 2013-09-23 DIAGNOSIS — Y839 Surgical procedure, unspecified as the cause of abnormal reaction of the patient, or of later complication, without mention of misadventure at the time of the procedure: Secondary | ICD-10-CM | POA: Diagnosis not present

## 2013-09-23 DIAGNOSIS — R630 Anorexia: Secondary | ICD-10-CM

## 2013-09-23 DIAGNOSIS — Z90710 Acquired absence of both cervix and uterus: Secondary | ICD-10-CM

## 2013-09-23 DIAGNOSIS — D702 Other drug-induced agranulocytosis: Secondary | ICD-10-CM

## 2013-09-23 DIAGNOSIS — Z880 Allergy status to penicillin: Secondary | ICD-10-CM

## 2013-09-23 DIAGNOSIS — R1032 Left lower quadrant pain: Secondary | ICD-10-CM

## 2013-09-23 DIAGNOSIS — C786 Secondary malignant neoplasm of retroperitoneum and peritoneum: Secondary | ICD-10-CM | POA: Diagnosis present

## 2013-09-23 DIAGNOSIS — J45909 Unspecified asthma, uncomplicated: Secondary | ICD-10-CM | POA: Diagnosis present

## 2013-09-23 DIAGNOSIS — R1115 Cyclical vomiting syndrome unrelated to migraine: Secondary | ICD-10-CM | POA: Diagnosis present

## 2013-09-23 DIAGNOSIS — R112 Nausea with vomiting, unspecified: Secondary | ICD-10-CM | POA: Diagnosis present

## 2013-09-23 DIAGNOSIS — E876 Hypokalemia: Secondary | ICD-10-CM | POA: Diagnosis not present

## 2013-09-23 DIAGNOSIS — C180 Malignant neoplasm of cecum: Secondary | ICD-10-CM | POA: Diagnosis present

## 2013-09-23 DIAGNOSIS — K560 Paralytic ileus: Secondary | ICD-10-CM | POA: Diagnosis not present

## 2013-09-23 DIAGNOSIS — K219 Gastro-esophageal reflux disease without esophagitis: Secondary | ICD-10-CM | POA: Diagnosis present

## 2013-09-23 DIAGNOSIS — Z881 Allergy status to other antibiotic agents status: Secondary | ICD-10-CM

## 2013-09-23 DIAGNOSIS — R1031 Right lower quadrant pain: Secondary | ICD-10-CM

## 2013-09-23 DIAGNOSIS — K56609 Unspecified intestinal obstruction, unspecified as to partial versus complete obstruction: Secondary | ICD-10-CM

## 2013-09-23 DIAGNOSIS — M1990 Unspecified osteoarthritis, unspecified site: Secondary | ICD-10-CM | POA: Diagnosis present

## 2013-09-23 DIAGNOSIS — K56600 Partial intestinal obstruction, unspecified as to cause: Secondary | ICD-10-CM | POA: Diagnosis present

## 2013-09-23 DIAGNOSIS — K5669 Other intestinal obstruction: Secondary | ICD-10-CM | POA: Diagnosis present

## 2013-09-23 DIAGNOSIS — C787 Secondary malignant neoplasm of liver and intrahepatic bile duct: Secondary | ICD-10-CM | POA: Diagnosis present

## 2013-09-23 DIAGNOSIS — I491 Atrial premature depolarization: Secondary | ICD-10-CM | POA: Diagnosis present

## 2013-09-23 DIAGNOSIS — Z885 Allergy status to narcotic agent status: Secondary | ICD-10-CM

## 2013-09-23 DIAGNOSIS — E43 Unspecified severe protein-calorie malnutrition: Secondary | ICD-10-CM | POA: Diagnosis present

## 2013-09-23 DIAGNOSIS — G4700 Insomnia, unspecified: Secondary | ICD-10-CM | POA: Diagnosis present

## 2013-09-23 DIAGNOSIS — M899 Disorder of bone, unspecified: Secondary | ICD-10-CM | POA: Diagnosis present

## 2013-09-23 DIAGNOSIS — F341 Dysthymic disorder: Secondary | ICD-10-CM | POA: Diagnosis present

## 2013-09-23 DIAGNOSIS — T451X5A Adverse effect of antineoplastic and immunosuppressive drugs, initial encounter: Secondary | ICD-10-CM | POA: Diagnosis present

## 2013-09-23 DIAGNOSIS — Z79899 Other long term (current) drug therapy: Secondary | ICD-10-CM

## 2013-09-23 DIAGNOSIS — E785 Hyperlipidemia, unspecified: Secondary | ICD-10-CM | POA: Diagnosis present

## 2013-09-23 DIAGNOSIS — Z8262 Family history of osteoporosis: Secondary | ICD-10-CM

## 2013-09-23 DIAGNOSIS — K3184 Gastroparesis: Secondary | ICD-10-CM | POA: Diagnosis present

## 2013-09-23 DIAGNOSIS — R6881 Early satiety: Secondary | ICD-10-CM | POA: Diagnosis present

## 2013-09-23 DIAGNOSIS — M949 Disorder of cartilage, unspecified: Secondary | ICD-10-CM

## 2013-09-23 LAB — COMPREHENSIVE METABOLIC PANEL
ALBUMIN: 3.3 g/dL — AB (ref 3.5–5.2)
ALT: 14 U/L (ref 0–35)
AST: 19 U/L (ref 0–37)
Alkaline Phosphatase: 120 U/L — ABNORMAL HIGH (ref 39–117)
BILIRUBIN TOTAL: 0.5 mg/dL (ref 0.3–1.2)
BUN: 17 mg/dL (ref 6–23)
CHLORIDE: 96 meq/L (ref 96–112)
CO2: 26 mEq/L (ref 19–32)
CREATININE: 0.48 mg/dL — AB (ref 0.50–1.10)
Calcium: 8.7 mg/dL (ref 8.4–10.5)
GFR calc Af Amer: >90 mL/min (ref >90)
GFR calc non Af Amer: >90 mL/min (ref >90)
Glucose, Bld: 114 mg/dL — ABNORMAL HIGH (ref 70–99)
Potassium: 3.3 mEq/L — ABNORMAL LOW (ref 3.7–5.3)
Sodium: 136 mEq/L — ABNORMAL LOW (ref 137–147)
Total Protein: 6.9 g/dL (ref 6.0–8.3)

## 2013-09-23 LAB — CBC WITH DIFFERENTIAL/PLATELET
Basophils Absolute: 0 10*3/uL (ref 0.0–0.1)
Basophils Relative: 0 % (ref 0–1)
EOS ABS: 0 10*3/uL (ref 0.0–0.7)
EOS PCT: 0 % (ref 0–5)
HEMATOCRIT: 33.9 % — AB (ref 36.0–46.0)
Hemoglobin: 10.9 g/dL — ABNORMAL LOW (ref 12.0–15.0)
LYMPHS ABS: 1.5 10*3/uL (ref 0.7–4.0)
Lymphocytes Relative: 30 % (ref 12–46)
MCH: 29.3 pg (ref 26.0–34.0)
MCHC: 32.2 g/dL (ref 30.0–36.0)
MCV: 91.1 fL (ref 78.0–100.0)
MONO ABS: 0.7 10*3/uL (ref 0.1–1.0)
MONOS PCT: 13 % — AB (ref 3–12)
Neutro Abs: 2.9 10*3/uL (ref 1.7–7.7)
Neutrophils Relative %: 57 % (ref 43–77)
Platelets: 148 10*3/uL — ABNORMAL LOW (ref 150–400)
RBC: 3.72 MIL/uL — AB (ref 3.87–5.11)
RDW: 16.9 % — ABNORMAL HIGH (ref 11.5–15.5)
WBC: 5.1 10*3/uL (ref 4.0–10.5)

## 2013-09-23 MED ORDER — TRAMADOL HCL 50 MG PO TABS
50.0000 mg | ORAL_TABLET | Freq: Three times a day (TID) | ORAL | Status: DC | PRN
  Administered 2013-09-23 – 2013-09-26 (×3): 50 mg via ORAL
  Filled 2013-09-23 (×3): qty 1

## 2013-09-23 MED ORDER — IOHEXOL 300 MG/ML  SOLN
100.0000 mL | Freq: Once | INTRAMUSCULAR | Status: AC | PRN
  Administered 2013-09-23: 100 mL via INTRAVENOUS

## 2013-09-23 MED ORDER — HEPARIN SODIUM (PORCINE) 5000 UNIT/ML IJ SOLN
5000.0000 [IU] | Freq: Three times a day (TID) | INTRAMUSCULAR | Status: AC
  Administered 2013-09-23 – 2013-09-25 (×5): 5000 [IU] via SUBCUTANEOUS
  Filled 2013-09-23 (×11): qty 1

## 2013-09-23 MED ORDER — BISACODYL 10 MG RE SUPP: 10.0000 mg | Freq: Every day | RECTAL | Status: DC | PRN

## 2013-09-23 MED ORDER — ONDANSETRON HCL 4 MG/2ML IJ SOLN
4.0000 mg | Freq: Four times a day (QID) | INTRAMUSCULAR | Status: DC | PRN
  Administered 2013-09-23 – 2013-09-26 (×9): 4 mg via INTRAVENOUS
  Filled 2013-09-23 (×9): qty 2

## 2013-09-23 MED ORDER — ONDANSETRON 8 MG/NS 50 ML IVPB
INTRAVENOUS | Status: AC
  Filled 2013-09-23: qty 8

## 2013-09-23 MED ORDER — MORPHINE SULFATE 2 MG/ML IJ SOLN
2.0000 mg | INTRAMUSCULAR | Status: DC | PRN
  Administered 2013-09-26 (×3): 2 mg via INTRAVENOUS
  Filled 2013-09-23 (×3): qty 1

## 2013-09-23 MED ORDER — SODIUM CHLORIDE 0.9 % IV SOLN: INTRAVENOUS | Status: DC

## 2013-09-23 MED ORDER — ONDANSETRON HCL 4 MG PO TABS: 4.0000 mg | ORAL_TABLET | Freq: Four times a day (QID) | ORAL | Status: DC | PRN

## 2013-09-23 MED ORDER — MIRTAZAPINE 15 MG PO TABS
15.0000 mg | ORAL_TABLET | Freq: Every day | ORAL | Status: DC
  Administered 2013-09-23 – 2013-09-26 (×4): 15 mg via ORAL
  Filled 2013-09-23 (×6): qty 1

## 2013-09-23 MED ORDER — ALPRAZOLAM 0.25 MG PO TABS: 0.1250 mg | ORAL_TABLET | Freq: Two times a day (BID) | ORAL | Status: DC | PRN

## 2013-09-23 MED ORDER — POLYETHYLENE GLYCOL 3350 17 G PO PACK
17.0000 g | PACK | Freq: Every day | ORAL | Status: DC | PRN
  Filled 2013-09-23: qty 1

## 2013-09-23 MED ORDER — ACETAMINOPHEN 325 MG PO TABS
650.0000 mg | ORAL_TABLET | Freq: Four times a day (QID) | ORAL | Status: DC | PRN
  Administered 2013-09-25: 650 mg via ORAL
  Filled 2013-09-23 (×2): qty 2

## 2013-09-23 MED ORDER — GLYCERIN (LAXATIVE) 1.2 G RE SUPP
1.0000 | Freq: Every day | RECTAL | Status: DC | PRN
  Filled 2013-09-23: qty 1

## 2013-09-23 MED ORDER — ONDANSETRON 8 MG/50ML IVPB (CHCC)
8.0000 mg | Freq: Once | INTRAVENOUS | Status: AC
  Administered 2013-09-23: 8 mg via INTRAVENOUS

## 2013-09-23 MED ORDER — SODIUM CHLORIDE 0.9 % IV SOLN
INTRAVENOUS | Status: DC
  Administered 2013-09-23 – 2013-09-25 (×3): via INTRAVENOUS

## 2013-09-23 MED ORDER — ACETAMINOPHEN 650 MG RE SUPP: 650.0000 mg | Freq: Four times a day (QID) | RECTAL | Status: DC | PRN

## 2013-09-23 MED ORDER — PANTOPRAZOLE SODIUM 40 MG IV SOLR
40.0000 mg | INTRAVENOUS | Status: DC
  Administered 2013-09-23 – 2013-10-02 (×9): 40 mg via INTRAVENOUS
  Filled 2013-09-23 (×12): qty 40

## 2013-09-23 MED ORDER — ZOLPIDEM TARTRATE 5 MG PO TABS
5.0000 mg | ORAL_TABLET | Freq: Every evening | ORAL | Status: DC | PRN
  Administered 2013-09-23: 5 mg via ORAL
  Filled 2013-09-23: qty 1

## 2013-09-23 NOTE — Progress Notes (Signed)
Patient taken to admitting, via wheelchair, after vomiting light green vomitus. Dr Benay Spice notified.

## 2013-09-23 NOTE — H&P (Signed)
[facility] History and Physical  Kelly [name] [id number] DOB: 1941/03/30 DOA: 09/23/2013  Referring physician: Dr. Benay Spice PCP: Ria Bush, MD  Specialists:   Chief Complaint: N/V  HPI: Kelly [name] is a 73 y.o. female  With a hx of stage 4 colon cancer currently on chemo who presents with persistent n/v. Pt was seen in ED on 1/21 for the same sx. Imaging at that time demonstrated nonspecific bowel gas patterns. The patient reportedly felt better but soon the sx continued and the patient was unable to tolerate regular PO. Thus far, only tolerating popsicles. Pt was seen in clinic today where she continued with intractable n/v and was referred for direct admission for further work up.  Review of Systems:  Per above, the remainder of the 10pt ros reviewed and are neg  Past Medical History  Diagnosis Date  . Premature ventricular contractions   . Palpitations     occasional  . Hyperlipidemia   . Osteoarthritis   . Anxiety and depression   . Chronic insomnia   . Osteopenia 01/2012  . Diverticulosis 01/2013    by CT  . DDD (degenerative disc disease), lumbar 01/2013    by CT (L5 B pars defects with 1cm slip, L5/S1 DDD with prominent B foraminal narrowing with mass effect on exiting L5 nerve root)  . GERD (gastroesophageal reflux disease)   . Asthma 06-01-13    no use of rescue inhalers in many years   Past Surgical History  Procedure Laterality Date  . Nsvd      x2  . Btl    . Total abdominal hysterectomy      due to endometriosis w endmetrioma  . Appendectomy    . Stress cadrio lite  04/26/1999    normal ef 76%  . Dexa osteopenia fem neck, spine  08/22/2002  . Abd/ultra sound within normal limits  05/08/2004  . Stress myoview  05/15/2006    normal  . L knee arthroscopy (other)  09/25/2008    Dr. Lorre Munroe  . R knee arthroscopy (other)  06/26/2009    lots of arthritis Dr. Mauri Pole  . Dexa  01/2012    osteopenia (T score femur -1.9, forearm -1.5)  .  Breast surgery Left 06-01-13    Left breast "chip" implanted as marker(none malignant)  . Colonoscopy N/A 06/02/2013    Procedure: COLONOSCOPY;  Surgeon: Jerene Bears, MD;  Location: [facility];  Service: Gastroenterology;  Laterality: N/A;   Social History:  reports that she has never smoked. She has never used smokeless tobacco. She reports that she does not drink alcohol or use illicit drugs.  where does patient live--home, ALF, SNF? and with whom if at home?  Can patient participate in ADLs?  Allergies  Allergen Reactions  . Adhesive [Tape] Other (See Comments)    Tears skin  . Amoxicillin     REACTION: RASH  . Clarithromycin     REACTION: GI UPSET  . Codeine Nausea Only and Palpitations    Family History  Problem Relation Age of Onset  . Heart attack Mother   . Aortic stenosis Mother   . Heart disease Mother     MI, aortic stenosis, pacer  . Parkinsonism Father   . Osteoporosis Sister   . Arthritis Sister   . Breast cancer Maternal Aunt   . Stroke Neg Hx   . Drug abuse Neg Hx   . Depression Neg Hx   . Ovarian cancer Neg Hx   . Colon cancer  Neg Hx     (be sure to complete)  Prior to Admission medications   Medication Sig Start Date End Date Taking? Authorizing Provider  ALPRAZolam (XANAX) 0.25 MG tablet Take 0.5 tablets (0.125 mg total) by mouth 2 (two) times daily as needed for anxiety. 06/29/13   Ladell Pier, MD  glycerin, Pediatric, 1.2 G SUPP Place 1 suppository rectally daily as needed for mild constipation.     Historical Provider, MD  hyoscyamine (LEVSIN SL) 0.125 MG SL tablet place one tablet under the tongue every 6 hours as needed for cramping 09/23/13   Jerene Bears, MD  lidocaine-prilocaine (EMLA) cream Apply topically to port a cath site one hour prior to use. Do not rub in. Cover with plastic. 06/10/13   Ladell Pier, MD  loratadine (CLARITIN) 10 MG tablet Take 10 mg by mouth daily as needed for allergies.     Historical Provider, MD  mirtazapine  (REMERON) 15 MG tablet Take 1 tablet (15 mg total) by mouth at bedtime. 08/29/13   Ladell Pier, MD  omeprazole (PRILOSEC) 20 MG capsule Take 20 mg by mouth daily. Will take bid as needed (for allergies) 03/08/13   Ria Bush, MD  ondansetron (ZOFRAN ODT) 4 MG disintegrating tablet Take 1 tablet (4 mg total) by mouth every 8 (eight) hours as needed for nausea or vomiting. 08/03/13   Ladell Pier, MD  polyethylene glycol [facility] Floria Raveling) packet Take 17 g by mouth daily as needed for mild constipation.     Historical Provider, MD  potassium chloride SA (K-DUR,KLOR-CON) 20 MEQ tablet Take 1 tablet (20 mEq total) by mouth daily. Take twice daily on 06/29/13, then one by mouth daily 06/29/13   Ladell Pier, MD  PRESCRIPTION MEDICATION See admin instructions. Chemotherapy at [facility] (Dr. Benay Spice), FOLFIRI regimen, last treatment 09/14/2013    Historical Provider, MD  promethazine (PHENERGAN) 25 MG suppository Place 1 suppository (25 mg total) rectally every 6 (six) hours as needed for nausea or vomiting. 09/21/13   Jasper Riling. Alvino Chapel, MD  promethazine (PHENERGAN) 25 MG tablet Take 1 tablet (25 mg total) by mouth every 6 (six) hours as needed for nausea. 09/21/13   Jasper Riling. Pickering, MD  sennosides-docusate sodium (SENOKOT-S) 8.6-50 MG tablet Take 1 tablet by mouth 2 (two) times daily as needed for constipation.    Historical Provider, MD  traMADol (ULTRAM) 50 MG tablet Take 50 mg by mouth every 8 (eight) hours as needed for pain.     Historical Provider, MD  zolpidem (AMBIEN) 10 MG tablet Take 10 mg by mouth at bedtime as needed for sleep.    Historical Provider, MD   Physical Exam: Filed Vitals:   09/23/13 1609  BP: 140/78  Pulse: 105  Temp: 98.2 F (36.8 C)  TempSrc: Oral  Resp: 16  Height: 5\' 5"  (1.651 m)  Weight: 64.3 kg (141 lb 12.1 oz)  SpO2: 97%     General:  Awake, in nad  Eyes: PERRL B  ENT: membranes moist, dentition fair  Neck: trachea midline, neck  supple  Cardiovascular: regular, s1, s2  Respiratory: normal resp effort, no wheezing  Abdomen: soft, nondistended, pos bs  Skin: normal skin turgor, no abnormal skin lesions seen  Musculoskeletal: perfused, no clubbing  Psychiatric: mood/affect normal // no auditory/visual hallucinations  Neurologic: cn2-12 grossly intact, strength/sensation intact  Labs on Admission:  Basic Metabolic Panel:  Recent Labs Lab 09/21/13 1550  NA 136*  K 3.9  CL 97  CO2  20  GLUCOSE 141*  BUN 13  CREATININE 0.50  CALCIUM 8.9   Liver Function Tests:  Recent Labs Lab 09/21/13 1550  AST 20  ALT 14  ALKPHOS 119*  BILITOT 0.9  PROT 7.3  ALBUMIN 3.5    Recent Labs Lab 09/21/13 1550  LIPASE 12   No results found for this basename: AMMONIA,  in the last 168 hours CBC:  Recent Labs Lab 09/21/13 1550  WBC 5.5  NEUTROABS 3.4  HGB 12.8  HCT 39.4  MCV 89.1  PLT PLATELET CLUMPS NOTED ON SMEAR, COUNT APPEARS INCREASED   Cardiac Enzymes:  Recent Labs Lab 09/21/13 1550  TROPONINI <0.30    BNP (last 3 results) No results found for this basename: PROBNP,  in the last 8760 hours CBG: No results found for this basename: GLUCAP,  in the last 168 hours  Radiological Exams on Admission: No results found.  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 Intractable n/v 1. Will obtain CT abd to eval for obstruction 2. Reports passing stool and flatus 3. Pos bowel sounds on exam 4. Will cont with bowel regimen as tolerated 5. Additional w/u depending on CT results 2. HLD 1. Cont home meds 3. Anxiety 1. Cont home benzo as tolerated 4. Insomnia 1. Cont ambien per home regimen 5. Colon cancer 1. Per family, next chemo due on 09/28/13 2. Followed by Oncology 6. DVT prophylaxis 1. Heparin  Code Status: Full (must indicate code  status--if unknown or must be presumed, indicate so) Family Communication: Pt in room (indicate person spoken with, if applicable, with phone number if by telephone) Disposition Plan: Pending (indicate anticipated LOS)  Time spent: 62min  Ege Muckey, [facility] Pager [phone]  If 7PM-7AM, please contact night-coverage [email or web address] Password [facility] 09/23/2013, 6:04 PM

## 2013-09-23 NOTE — Progress Notes (Addendum)
Direct admission from Dr. Gearldine Shown office; pt has SBP and per Dr. Ammie Dalton needs surgery consultation so please consult surgery once pt on floor. Leisa Lenz

## 2013-09-23 NOTE — Telephone Encounter (Signed)
Message from pt's husband reporting she is vomiting on clear liquid diet. 2 episodes this morning. No relief since leaving the hospital. Spoke with pt, she is taking #2 Compazine tabs every 6 hours, using Tramadol and Levsin for abdominal cramping. Reviewed with Dr. Benay Spice: Bring pt in for IVF, Lattie Haw, NP to see this afternoon.

## 2013-09-23 NOTE — Progress Notes (Signed)
   [place]    OFFICE PROGRESS NOTE   INTERVAL HISTORY:   Kelly [name] returns for an unscheduled visit. She completed a second cycle of FOLFIRI on 09/14/2013. She developed nausea and vomiting beginning on 09/20/2013. She was seen in the emergency room on 09/21/2013 and a plain x-ray revealed a nonspecific bowel gas pattern. She was placed on a clear liquid diet. Despite a clear liquid diet she has intermittent nausea and vomiting. She reports vomiting a "green "liquid material. She was having daily bowel movements while taking Senokot. She last had a bowel movement on 09/21/2013.  No pain or fever.  Objective:  Vital signs in last 24 hours:  There were no vitals taken for this visit.    HEENT: No thrush or ulcers Resp: Lungs clear bilaterally Cardio: Regular rate and rhythm GI: No hepatosplenomegaly, nontender, no mass, bowel sounds are present Vascular: No leg edema.  Skin: The skin turgor appears normal   Portacath/PICC-without erythema  Lab Results:  Lab Results  Component Value Date   WBC 5.5 09/21/2013   HGB 12.8 09/21/2013   HCT 39.4 09/21/2013   MCV 89.1 09/21/2013   PLT PLATELET CLUMPS NOTED ON SMEAR, COUNT APPEARS INCREASED 09/21/2013   NEUTROABS 3.4 09/21/2013      Medications: I have reviewed the patient's current medications.  Assessment/Plan: 1. Stage IV colon cancer, colonoscopy 06/02/2013 found a mass at the cecum with a biopsy confirming adenocarcinoma positive K-ras mutation, no loss of expression of mismatch repair proteins, microsatellite stable  Cycle 1 FOLFOX 06/15/2013  Cycle 2 FOLFOX/Avastin 06/29/2013  Cycle 3 FOLFOX/Avastin 07/21/2013  Cycle 4 FOLFOX/Avastin 08/03/2013  CT 08/18/2013 with mild improvement of hepatic metastases and peritoneal/omental metastases, the distal ileum dilatation. Treatment was switched to FOLFIRI secondary to persistent obstructive symptoms and an allergic reaction to oxaliplatin  Cycle 1 FOLFIRI  08/31/2013 Cycle 2 FOLFIRI 09/14/2013 2. 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 Acute nausea/vomiting 06/08/2013-? Related to partial obstruction 4. History of an arrhythmia  5. Depression -now taking Remeron, improved  6. Anorexia -persistent  7. Port-A-Cath placement 06/14/2013  8. neutropenia secondary chemotherapy, Neulasta was added with cycle 2 FOLFOX  9. Allergic reaction at the completion of the oxaliplatin infusion with cycle 4  10. Early satiety-likely related to gastroparesis/ileus,/partial bowel thrush 11. Persistent nausea/vomiting beginning 09/20/2013-unremarkable plain x-ray evaluation 09/21/2013   Disposition:  Kelly [name] has intractable nausea and vomiting. I suspect her symptoms are related to a partial bowel obstruction in the setting of a cecal tumor and carcinomatosis. Her symptoms have not responded to a clear liquid diet. It is possible the nausea is related to liver metastases or chemotherapy, but less likely.  I discussed the case with Dr. Dalbert Batman. She will be admitted for intravenous hydration, bowel rest, antiemetics, and further diagnostic evaluation. Dr. Dalbert Batman recommends obtaining an abdomen/pelvis CT with oral contrast to evaluate for a bowel obstruction.  She is now at day 10 following cycle 2 FOLFIRI. She last received Avastin 08/03/2013.  I contacted Dr. Charlies Silvers and she graciously agreed to admit Kelly [name].  I will check on her 09/26/2013. I asked Dr. Charlies Silvers to contact oncology as needed over the weekend.   Betsy Coder, MD  09/23/2013  3:50 PM

## 2013-09-24 DIAGNOSIS — K56609 Unspecified intestinal obstruction, unspecified as to partial versus complete obstruction: Secondary | ICD-10-CM

## 2013-09-24 DIAGNOSIS — C189 Malignant neoplasm of colon, unspecified: Secondary | ICD-10-CM

## 2013-09-24 LAB — BASIC METABOLIC PANEL
BUN: 17 mg/dL (ref 6–23)
CALCIUM: 8 mg/dL — AB (ref 8.4–10.5)
CO2: 24 mEq/L (ref 19–32)
CREATININE: 0.46 mg/dL — AB (ref 0.50–1.10)
Chloride: 99 mEq/L (ref 96–112)
GFR calc Af Amer: >90 mL/min (ref >90)
GLUCOSE: 93 mg/dL (ref 70–99)
Potassium: 3.2 mEq/L — ABNORMAL LOW (ref 3.7–5.3)
SODIUM: 135 meq/L — AB (ref 137–147)

## 2013-09-24 LAB — CBC
HCT: 32.4 % — ABNORMAL LOW (ref 36.0–46.0)
Hemoglobin: 10.3 g/dL — ABNORMAL LOW (ref 12.0–15.0)
MCH: 29.2 pg (ref 26.0–34.0)
MCHC: 31.8 g/dL (ref 30.0–36.0)
MCV: 91.8 fL (ref 78.0–100.0)
PLATELETS: 159 10*3/uL (ref 150–400)
RBC: 3.53 MIL/uL — ABNORMAL LOW (ref 3.87–5.11)
RDW: 17.2 % — AB (ref 11.5–15.5)
WBC: 5.7 10*3/uL (ref 4.0–10.5)

## 2013-09-24 LAB — PREALBUMIN: PREALBUMIN: 11.3 mg/dL — AB (ref 17.0–34.0)

## 2013-09-24 MED ORDER — POTASSIUM CHLORIDE 10 MEQ/100ML IV SOLN
10.0000 meq | INTRAVENOUS | Status: AC
  Administered 2013-09-24 (×3): 10 meq via INTRAVENOUS
  Filled 2013-09-24 (×3): qty 100

## 2013-09-24 MED ORDER — LORAZEPAM 2 MG/ML IJ SOLN
1.0000 mg | Freq: Four times a day (QID) | INTRAMUSCULAR | Status: DC | PRN
  Administered 2013-09-24 (×2): 1 mg via INTRAVENOUS
  Filled 2013-09-24 (×2): qty 1

## 2013-09-24 NOTE — Progress Notes (Signed)
[facility] PROGRESS NOTE  Kelly [name] OFB:[id number] DOB: 1941-03-10 DOA: 09/23/2013 PCP: Ria Bush, MD  Brief narrative: 73 y.o. female with past medical history of colon ca with hepatic metastases, peritoneal carcinomatosis who presented to [facility] ED 09/24/2013 who 09/25/2013 with intractable nausea and vomiting. She was ultimately found to have distal small bowel obstruction, with increased small bowel dilatation, peritoneal carcinomatosis with mild increase in size of peritoneal soft tissue masses in the right pelvis at site of distal small bowel obstruction, stable liver metastases.  Assessment/Plan:  Principal Problem:   N&V (nausea and vomiting) - secondary to small bowel obstruction secondary to metastatic colon cancer, peritoneal carcinomatosis - continue supportive care with IV fluids, analgesia and antiemetics - family to decide on surgery; appreciate surgery following - nutrition consult; will check prealbumin and if low we may start TPN   Code Status: full code Family Communication: family at the bedside Disposition Plan: home when stable  Leisa Lenz, MD  [facility] Pager [phone]  If 7PM-7AM, please contact night-coverage [email or web address] Password [email or web address] 09/24/2013, 6:48 AM   LOS: 1 day   Consultants:  Oncology   Surgery   Procedures:  None   Antibiotics:  None   HPI/Subjective: No overnight events.   Objective: Filed Vitals:   09/23/13 1609 09/23/13 2137 09/24/13 0510  BP: 140/78 125/51 123/82  Pulse: 105 106 114  Temp: 98.2 F (36.8 C) 98 F (36.7 C) 98.2 F (36.8 C)  TempSrc: Oral Oral Oral  Resp: 16 18 16   Height: 5\' 5"  (1.651 m)    Weight: 64.3 kg (141 lb 12.1 oz)    SpO2: 97% 98% 97%    Intake/Output Summary (Last 24 hours) at 09/24/13 0648 Last data filed at 09/24/13 0514  Gross per 24 hour  Intake 1056.67 ml  Output    525 ml  Net 531.67 ml    Exam:   General:  Pt is alert, follows commands  appropriately, not in acute distress  Cardiovascular: Regular rate and rhythm, S1/S2, no murmurs, no rubs, no gallops  Respiratory: Clear to auscultation bilaterally, no wheezing, no crackles, no rhonchi  Abdomen: Soft, non tender, non distended, bowel sounds present, no guarding  Extremities: No edema, pulses DP and PT palpable bilaterally  Neuro: Grossly nonfocal  Data Reviewed: Basic Metabolic Panel:  Recent Labs Lab 09/21/13 1550 09/23/13 1750  NA 136* 136*  K 3.9 3.3*  CL 97 96  CO2 20 26  GLUCOSE 141* 114*  BUN 13 17  CREATININE 0.50 0.48*  CALCIUM 8.9 8.7   Liver Function Tests:  Recent Labs Lab 09/21/13 1550 09/23/13 1750  AST 20 19  ALT 14 14  ALKPHOS 119* 120*  BILITOT 0.9 0.5  PROT 7.3 6.9  ALBUMIN 3.5 3.3*    Recent Labs Lab 09/21/13 1550  LIPASE 12   No results found for this basename: AMMONIA,  in the last 168 hours CBC:  Recent Labs Lab 09/21/13 1550 09/23/13 1750 09/24/13 0620  WBC 5.5 5.1 5.7  NEUTROABS 3.4 2.9  --   HGB 12.8 10.9* 10.3*  HCT 39.4 33.9* 32.4*  MCV 89.1 91.1 91.8  PLT PLATELET CLUMPS NOTED ON SMEAR, COUNT APPEARS INCREASED 148* 159   Cardiac Enzymes:  Recent Labs Lab 09/21/13 1550  TROPONINI <0.30   BNP: No components found with this basename: POCBNP,  CBG: No results found for this basename: GLUCAP,  in the last 168 hours  No results found for this or any previous visit (from the past 240  hour(s)).   Studies: Ct Abdomen Pelvis W Contrast  09/23/2013   CLINICAL DATA:  Intractable vomiting and abdominal pain. Colon carcinoma. Currently undergoing chemotherapy.  EXAM: CT ABDOMEN AND PELVIS WITH CONTRAST  TECHNIQUE: Multidetector CT imaging of the abdomen and pelvis was performed using the standard protocol following bolus administration of intravenous contrast.  CONTRAST:  142mL OMNIPAQUE IOHEXOL 300 MG/ML  SOLN  COMPARISON:  08/18/2013  FINDINGS: A distal small bowel obstruction is seen with increased small  bowel dilatation since prior exam. Transition zone is seen in the right pelvis, where there is increased size of 2 ill-defined soft tissue masses in the right anterior and posterior pelvis. Index mass in the right anterior pelvis measures 3.9 x 4.3 cm on image 67 compared with 2.9 x 3.0 cm previously. This is consistent with increased metastatic disease. Mesenteric lymphadenopathy in the lower abdomen and pelvis and multiple small peritoneal tumor implants within the omental fat showed nose significant  Mild mesenteric lymphadenopathy within the lower abdomen pelvis appears stable. 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 A loculated fluid collection in the left posterior pelvis remains stable.  Diffuse liver metastases show no significant change in size or number since previous study. Largest index lesion is seen in the lateral segment left hepatic lobe measuring 3.5 x 3.5 cm on image 16, without change since prior exam.  The gallbladder, pancreas, spleen, adrenal glands, and kidneys are normal in appearance. No evidence of hydronephrosis. No acute inflammatory process identified. No suspicious bone lesions identified.  IMPRESSION: Distal small bowel obstruction, with increased small bowel dilatation compared to prior exam.  Peritoneal carcinomatosis, with mild increase in size of peritoneal soft tissue masses in the right pelvis at site of distal small bowel obstruction.  Stable diffuse liver metastases and mild mesenteric lymphadenopathy.  Mild improvement in omental carcinomatosis in the left abdomen.   Electronically Signed   By: Earle Gell M.D.   On: 09/23/2013 19:43    Scheduled Meds: . heparin  5,000 Units Subcutaneous Q8H  . mirtazapine  15 mg Oral QHS  . pantoprazole (PROTONIX) IV  40 mg Intravenous Q24H   Continuous Infusions: . sodium chloride 100 mL/hr at 09/24/13 0404

## 2013-09-24 NOTE — Consult Note (Signed)
Patient ID: Kelly [name], female DOB: 16-Jun-1941, 73 y.o. MRN: [id number]  Chief Complaint   Patient presents with   .  New Evaluation     colon   HPI  Kelly [name] is a 73 y.o. female. She was referred by Dr. Julieanne Manson for a surgical oncology opinion regarding stage IV colon cancer with probable, intermittent, low-grade, malignant small bowel obstruction. She is [name]'s mother-in-law. [name] is her cardiologist. [name] is her gastroenterologist. [name] is her PCP.  This past summer she began complaining of intermittent suprapubic discomfort without any other significant symptoms other than occasional alternating diarrhea and constipation. She was initially diagnosed with a urinary tract infection but she continued to have discomfort and began developing early satiety, decreased food intake and some weight loss. On 06/02/2013 she underwent screening colonoscopy which revealed a 6 x 6 cm neoplastic mass in the right colon near the ileocecal valve. The anatomy of the cecum, appendiceal orifice, and ileocecal valve were actually distorted and not well seen. Biopsy showed adenocarcinoma. Subsequent CT scan showed numerous bilobar hepatic metastasis. There were also some peritoneal and omental metastasis and thickening of the cecum consistent with a cecal mass.  She completed 4 cycles of FOLFOX/Avastin on 08/03/2013. She began having intermittent episodes of vomiting in December. She continued to have bowel movements. She stated that she did not have any pain but has very loud rumbling bowel sounds. She had an allergic reaction to oxaliplatin after cycle 4. She developed the obstructive symptoms shortly thereafter.  A followup CT scan was performed on 08/18/2013. This shows mild improvement of hepatic metastasis. Slight improvement in peritoneal and omental metastasis. The dominant mass off the inferior aspect of the cecum was slightly smaller. There was a  serosal metastasis in the right mid abdomen somewhat near the cecum. There is mild mid ileal dilatation with a decompressed terminal ileal loop was in the region of the serosal disease, suggesting a partial SBO. Contrast immediately went through into the colon.  Avastin has been held. Chemotherapy was changed to FOLFIRI (irinotecan). She is taking Senokot, 4 tablets per day. She has seen a nutritionist and is staying on a low fiber diet. For the past 2 weeks she has felt much better. She is tolerating a diet, although still has early satiety is trying to drink more water and follow 6 small feedings per day. She denies pain nausea or vomiting. She has lost 30 pounds in the past 6 months. Recent albumin is 3.2.LFTs are normal.  Comorbidities include PACs, followed by [name]. History of rheumatic fever as a child. GERD. History TAH and BSO. History ruptured endometrioma with laparotomy for that.  She was seen in the office last week by Dr Dalbert Batman.  She was feeling better at that point on a low residue diet and it was decided not to intervene surgically.  Since then, she has began to have vomiting again and was admitted to the hospital.   HPI  Past Medical History   Diagnosis  Date   .  Premature ventricular contractions    .  Palpitations      occasional   .  Hyperlipidemia    .  Osteoarthritis    .  Anxiety and depression    .  Chronic insomnia    .  Osteopenia  01/2012   .  Diverticulosis  01/2013     by CT   .  DDD (degenerative disc disease), lumbar  01/2013     by CT (L5 B pars defects with 1cm slip, L5/S1 DDD with prominent B foraminal narrowing with mass effect on exiting L5 nerve root)   .  GERD (gastroesophageal reflux disease)    .  Asthma  06-01-13     no use of rescue inhalers in many years    Past Surgical History   Procedure  Laterality  Date   .  Nsvd       x2   .  Btl     .  Total abdominal hysterectomy       due to endometriosis w endmetrioma   .  Appendectomy     .   Stress cadrio lite   04/26/1999     normal ef 76%   .  Dexa osteopenia fem neck, spine   08/22/2002   .  Abd/ultra sound within normal limits   05/08/2004   .  Stress myoview   05/15/2006     normal   .  L knee arthroscopy (other)   09/25/2008     Dr. Lorre Munroe   .  R knee arthroscopy (other)   06/26/2009     lots of arthritis Dr. Mauri Pole   .  Dexa   01/2012     osteopenia (T score femur -1.9, forearm -1.5)   .  Breast surgery  Left  06-01-13     Left breast "chip" implanted as marker(none malignant)   .  Colonoscopy  N/A  06/02/2013     Procedure: COLONOSCOPY; Surgeon: Jerene Bears, MD; Location: [facility]; Service: Gastroenterology; Laterality: N/A;    Family History   Problem  Relation  Age of Onset   .  Heart attack  Mother    .  Aortic stenosis  Mother    .  Heart disease  Mother      MI, aortic stenosis, pacer   .  Parkinsonism  Father    .  Osteoporosis  Sister    .  Arthritis  Sister    .  Breast cancer  Maternal Aunt    .  Stroke  Neg Hx    .  Drug abuse  Neg Hx    .  Depression  Neg Hx    .  Ovarian cancer  Neg Hx    .  Colon cancer  Neg Hx    Social History  History   Substance Use Topics   .  Smoking status:  Never Smoker   .  Smokeless tobacco:  Never Used   .  Alcohol Use:  No    Allergies   Allergen  Reactions   .  Adhesive [Tape]  Other (See Comments)     Tears skin   .  Amoxicillin      REACTION: RASH   .  Clarithromycin      REACTION: GI UPSET   .  Codeine  Nausea Only and Palpitations    Current Outpatient Prescriptions   Medication  Sig  Dispense  Refill   .  acetaminophen (TYLENOL) 500 MG tablet  Take 500 mg by mouth every 6 (six) hours as needed for pain.     Marland Kitchen  ALPRAZolam (XANAX) 0.25 MG tablet  Take 0.5 tablets (0.125 mg total) by mouth 2 (two) times daily as needed for anxiety.  60 tablet  0   .  glycerin, Pediatric, 1.2 G SUPP  Place 1 suppository rectally daily as needed. constapation     .  hyoscyamine (LEVSIN SL)  0.125 MG SL tablet  Place  0.125 mg under the tongue every 6 (six) hours as needed for cramping.     .  lidocaine-prilocaine (EMLA) cream  Apply topically to port a cath site one hour prior to use. Do not rub in. Cover with plastic.  30 g  1   .  loratadine (CLARITIN) 10 MG tablet  Take 10 mg by mouth daily as needed.     .  metoCLOPramide (REGLAN) 5 MG tablet  Take 1 tablet (5 mg total) by mouth 3 (three) times daily before meals.  90 tablet  1   .  mirtazapine (REMERON) 15 MG tablet  Take 1 tablet (15 mg total) by mouth at bedtime.  30 tablet  5   .  omeprazole (PRILOSEC) 20 MG capsule  Take 20 mg by mouth daily. Will take bid as needed (for allergies)     .  ondansetron (ZOFRAN ODT) 4 MG disintegrating tablet  Take 1 tablet (4 mg total) by mouth every 8 (eight) hours as needed for nausea or vomiting.  20 tablet  2   .  ondansetron (ZOFRAN) 4 MG tablet  Take 1 tablet (4 mg total) by mouth every 6 (six) hours as needed for nausea.  60 tablet  0   .  polyethylene glycol (MIRALAX / GLYCOLAX) packet  Take 17 g by mouth daily as needed.     .  potassium chloride SA (K-DUR,KLOR-CON) 20 MEQ tablet  Take 1 tablet (20 mEq total) by mouth daily. Take twice daily on 06/29/13, then one by mouth daily  31 tablet  2   .  sennosides-docusate sodium (SENOKOT-S) 8.6-50 MG tablet  Take 1 tablet by mouth 2 (two) times daily as needed for constipation.     .  traMADol (ULTRAM) 50 MG tablet  Take 50-100 mg by mouth every 8 (eight) hours as needed for pain.     Marland Kitchen  zolpidem (AMBIEN) 10 MG tablet  TAKE ONE TABLET BY MOUTH NIGHTLY AT BEDTIME AS NEEDED  30 tablet  0    No current facility-administered medications for this visit.   Review of Systems  Review of Systems  Constitutional: Positive for unexpected weight change. Negative for fever and chills.  HENT: Negative for congestion, hearing loss, sore throat, trouble swallowing and voice change.  Eyes: Negative for visual disturbance.  Respiratory: Negative for cough and wheezing.    Cardiovascular: Negative for chest pain, palpitations and leg swelling.  Gastrointestinal: Positive for nausea, vomiting and diarrhea. Negative for abdominal pain, constipation, blood in stool, abdominal distention and anal bleeding.  Genitourinary: Negative for hematuria, vaginal bleeding and difficulty urinating.  Musculoskeletal: Negative for arthralgias.  Skin: Negative for rash and wound.  Neurological: Negative for seizures, syncope and headaches.  Hematological: Negative for adenopathy. Does not bruise/bleed easily.  Psychiatric/Behavioral: Negative for confusion. The patient is nervous/anxious.  Filed Vitals:   09/24/13 0510  BP: 123/82  Pulse: 114  Temp: 98.2 F (36.8 C)  Resp: 16   Physical Exam  Physical Exam  Constitutional: She is oriented to person, place, and time. She appears well-developed and well-nourished. No distress.  She looks good.  She does not appear deconditioned.  Cardiovascular: Normal rate, regular rhythm, normal heart sounds and intact distal pulses.  Pulmonary/Chest: Effort normal and breath sounds normal. No respiratory distress. She has no wheezes.  Abdominal: Soft, distended. There is no tenderness.     Musculoskeletal: She exhibits no edema and no tenderness.  Neurological: She is alert  and oriented to person, place, and time. Skin: Skin is warm. No rash noted. She is not diaphoretic. No erythema. No pallor.  Psychiatric: She has a normal mood and affect. Her behavior is normal. Judgment and thought content normal.   Imaging CT ABD AND PELVIS IMPRESSION:  Distal small bowel obstruction, with increased small bowel dilatation compared to prior exam.  Peritoneal carcinomatosis, with mild increase in size of peritoneal soft tissue masses in the right pelvis at site of distal small bowel obstruction.  Stable diffuse liver metastases and mild mesenteric lymphadenopathy.  Mild improvement in omental carcinomatosis in the left abdomen.  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   History of TAH and BSO  History ruptured endometrioma, were resected through right lower quadrant scar  History PACs  Remote history of rheumatic fever with no known valvular disease   Plan  We had a very long discussion.  Since she has developed progressive obstructive symptoms, I have asked her to consider surgical intervention. We discussed the possible surgical options including major resection, limited resection and bypass.  Other options include continuing low residue diet and chemotherapy and entering a palliative program.  I think that she is strong enough to withstand a surgery but her risks of potential complications is still high.  There is a risk that she would not survive the entire process though.  Multiple family members in the room.  They will discuss over the weekend.  I will see her again tomorrow and answer questions.  I believe we could operate on Monday, if that is what they decide.

## 2013-09-25 LAB — BASIC METABOLIC PANEL
BUN: 17 mg/dL (ref 6–23)
CALCIUM: 7.8 mg/dL — AB (ref 8.4–10.5)
CO2: 20 mEq/L (ref 19–32)
CREATININE: 0.4 mg/dL — AB (ref 0.50–1.10)
Chloride: 103 mEq/L (ref 96–112)
GFR calc non Af Amer: >90 mL/min (ref >90)
Glucose, Bld: 81 mg/dL (ref 70–99)
Potassium: 3.3 mEq/L — ABNORMAL LOW (ref 3.7–5.3)
Sodium: 139 mEq/L (ref 137–147)

## 2013-09-25 LAB — CBC
HCT: 32.5 % — ABNORMAL LOW (ref 36.0–46.0)
Hemoglobin: 10.5 g/dL — ABNORMAL LOW (ref 12.0–15.0)
MCH: 29.9 pg (ref 26.0–34.0)
MCHC: 32.3 g/dL (ref 30.0–36.0)
MCV: 92.6 fL (ref 78.0–100.0)
Platelets: 225 10*3/uL (ref 150–400)
RBC: 3.51 MIL/uL — ABNORMAL LOW (ref 3.87–5.11)
RDW: 17.5 % — AB (ref 11.5–15.5)
WBC: 6.7 10*3/uL (ref 4.0–10.5)

## 2013-09-25 LAB — GLUCOSE, CAPILLARY: Glucose-Capillary: 109 mg/dL — ABNORMAL HIGH (ref 70–99)

## 2013-09-25 MED ORDER — TRACE MINERALS CR-CU-F-FE-I-MN-MO-SE-ZN IV SOLN
INTRAVENOUS | Status: AC
  Administered 2013-09-25: 18:00:00 via INTRAVENOUS
  Filled 2013-09-25: qty 1000

## 2013-09-25 MED ORDER — POTASSIUM CHLORIDE 10 MEQ/100ML IV SOLN
10.0000 meq | INTRAVENOUS | Status: DC
  Filled 2013-09-25 (×4): qty 100

## 2013-09-25 MED ORDER — SODIUM CHLORIDE 0.9 % IV SOLN
1.0000 g | Freq: Once | INTRAVENOUS | Status: AC
  Administered 2013-09-25: 1 g via INTRAVENOUS
  Filled 2013-09-25: qty 10

## 2013-09-25 MED ORDER — SODIUM CHLORIDE 0.9 % IJ SOLN
10.0000 mL | Freq: Two times a day (BID) | INTRAMUSCULAR | Status: DC
  Administered 2013-09-26 – 2013-10-06 (×12): 10 mL

## 2013-09-25 MED ORDER — SODIUM CHLORIDE 0.9 % IV SOLN: INTRAVENOUS | Status: DC

## 2013-09-25 MED ORDER — LORAZEPAM 2 MG/ML IJ SOLN: 0.5000 mg | Freq: Four times a day (QID) | INTRAMUSCULAR | Status: DC | PRN

## 2013-09-25 MED ORDER — INSULIN ASPART 100 UNIT/ML ~~LOC~~ SOLN
0.0000 [IU] | Freq: Four times a day (QID) | SUBCUTANEOUS | Status: DC
  Administered 2013-09-26 (×3): 1 [IU] via SUBCUTANEOUS
  Administered 2013-09-27 – 2013-09-28 (×2): 2 [IU] via SUBCUTANEOUS
  Administered 2013-09-28 – 2013-09-30 (×7): 1 [IU] via SUBCUTANEOUS
  Administered 2013-09-30: 2 [IU] via SUBCUTANEOUS
  Administered 2013-09-30 – 2013-10-04 (×9): 1 [IU] via SUBCUTANEOUS

## 2013-09-25 MED ORDER — FAT EMULSION 20 % IV EMUL
240.0000 mL | INTRAVENOUS | Status: AC
  Administered 2013-09-25: 240 mL via INTRAVENOUS
  Filled 2013-09-25: qty 250

## 2013-09-25 MED ORDER — POTASSIUM CHLORIDE 10 MEQ/100ML IV SOLN
10.0000 meq | INTRAVENOUS | Status: AC
  Administered 2013-09-25: 10 meq via INTRAVENOUS
  Filled 2013-09-25 (×2): qty 100

## 2013-09-25 MED ORDER — SODIUM CHLORIDE 0.9 % IJ SOLN: 10.0000 mL | INTRAMUSCULAR | Status: DC | PRN

## 2013-09-25 NOTE — Progress Notes (Signed)
[facility] PROGRESS NOTE  Kelly [name] YIR:[id number] DOB: 1941/04/01 DOA: 09/23/2013 PCP: Ria Bush, MD  Brief narrative: 73 y.o. pleasant  female with past medical history of colon ca with hepatic metastases, peritoneal carcinomatosis who presented to [facility] ED 09/24/2013 who 09/25/2013 with intractable nausea and vomiting. She was ultimately found to have distal small bowel obstruction, with increased small bowel dilatation, peritoneal carcinomatosis with mild increase in size of peritoneal soft tissue masses in the right pelvis at site of distal small bowel obstruction, stable liver metastases.   Assessment/Plan:   Principal Problem:  N&V (nausea and vomiting)  - secondary to small bowel obstruction secondary to metastatic colon cancer, peritoneal carcinomatosis  - continue supportive care with IV fluids, analgesia and antiemetics  - low prealbumin level at 11 (normal range starts at 17); will start TNA - appreciate surgery following - family wants to speak with Dr. Benay Spice and get his opinion on having surgery - nutrition consulted  Active Problems: Moderate protein calorie nutrition - secondary to peritoneal carcinomatosis, colon ca, SBO - will start TNA  Code Status: full code  Family Communication: family at the bedside  Disposition Plan: home when stable   Consultants:  Oncology  Surgery  Procedures:  None  Antibiotics:  None    Leisa Lenz, MD  [facility] Pager [phone]  If 7PM-7AM, please contact night-coverage [email or web address] Password [facility] 09/25/2013, 7:18 AM   LOS: 2 days    HPI/Subjective: No acute overnight events.   Objective: Filed Vitals:   09/24/13 0510 09/24/13 1500 09/24/13 2053 09/25/13 0641  BP: 123/82 121/61 122/64 124/52  Pulse: 114 102 97 106  Temp: 98.2 F (36.8 C) 98.4 F (36.9 C) 98.4 F (36.9 C) 98.1 F (36.7 C)  TempSrc: Oral Oral Oral Oral  Resp: 16 16 16 16   Height:      Weight:      SpO2: 97% 100%  97% 96%    Intake/Output Summary (Last 24 hours) at 09/25/13 0718 Last data filed at 09/25/13 0600  Gross per 24 hour  Intake 2713.34 ml  Output   1100 ml  Net 1613.34 ml    Exam:   General:  Pt is alert, follows commands appropriately, not in acute distress  Cardiovascular: Regular rate and rhythm, S1/S2, no murmurs, no rubs, no gallops  Respiratory: Clear to auscultation bilaterally, no wheezing, no crackles, no rhonchi  Abdomen: Soft, non tender, non distended, bowel sounds present, no guarding  Extremities: No edema, pulses DP and PT palpable bilaterally  Neuro: Grossly nonfocal  Data Reviewed: Basic Metabolic Panel:  Recent Labs Lab 09/21/13 1550 09/23/13 1750 09/24/13 0620  NA 136* 136* 135*  K 3.9 3.3* 3.2*  CL 97 96 99  CO2 20 26 24   GLUCOSE 141* 114* 93  BUN 13 17 17   CREATININE 0.50 0.48* 0.46*  CALCIUM 8.9 8.7 8.0*   Liver Function Tests:  Recent Labs Lab 09/21/13 1550 09/23/13 1750  AST 20 19  ALT 14 14  ALKPHOS 119* 120*  BILITOT 0.9 0.5  PROT 7.3 6.9  ALBUMIN 3.5 3.3*    Recent Labs Lab 09/21/13 1550  LIPASE 12   No results found for this basename: AMMONIA,  in the last 168 hours CBC:  Recent Labs Lab 09/21/13 1550 09/23/13 1750 09/24/13 0620  WBC 5.5 5.1 5.7  NEUTROABS 3.4 2.9  --   HGB 12.8 10.9* 10.3*  HCT 39.4 33.9* 32.4*  MCV 89.1 91.1 91.8  PLT PLATELET CLUMPS NOTED ON SMEAR, COUNT APPEARS  INCREASED 148* 159   Cardiac Enzymes:  Recent Labs Lab 09/21/13 1550  TROPONINI <0.30   BNP: No components found with this basename: POCBNP,  CBG: No results found for this basename: GLUCAP,  in the last 168 hours  No results found for this or any previous visit (from the past 240 hour(s)).   Studies: Ct Abdomen Pelvis W Contrast  09/23/2013   CLINICAL DATA:  Intractable vomiting and abdominal pain. Colon carcinoma. Currently undergoing chemotherapy.  EXAM: CT ABDOMEN AND PELVIS WITH CONTRAST  TECHNIQUE: Multidetector  CT imaging of the abdomen and pelvis was performed using the standard protocol following bolus administration of intravenous contrast.  CONTRAST:  170mL OMNIPAQUE IOHEXOL 300 MG/ML  SOLN  COMPARISON:  08/18/2013  FINDINGS: A distal small bowel obstruction is seen with increased small bowel dilatation since prior exam. Transition zone is seen in the right pelvis, where there is increased size of 2 ill-defined soft tissue masses in the right anterior and posterior pelvis. Index mass in the right anterior pelvis measures 3.9 x 4.3 cm on image 67 compared with 2.9 x 3.0 cm previously. This is consistent with increased metastatic disease. Mesenteric lymphadenopathy in the lower abdomen and pelvis and multiple small peritoneal tumor implants within the omental fat showed nose significant  Mild mesenteric lymphadenopathy within the lower abdomen pelvis appears stable. 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 A loculated fluid collection in the left posterior pelvis remains stable.  Diffuse liver metastases show no significant change in size or number since previous study. Largest index lesion is seen in the lateral segment left hepatic lobe measuring 3.5 x 3.5 cm on image 16, without change since prior exam.  The gallbladder, pancreas, spleen, adrenal glands, and kidneys are normal in appearance. No evidence of hydronephrosis. No acute inflammatory process identified. No suspicious bone lesions identified.  IMPRESSION: Distal small bowel obstruction, with increased small bowel dilatation compared to prior exam.  Peritoneal carcinomatosis, with mild increase in size of peritoneal soft tissue masses in the right pelvis at site of distal small bowel obstruction.  Stable diffuse liver metastases and mild mesenteric lymphadenopathy.  Mild improvement in omental carcinomatosis in the left abdomen.   Electronically Signed   By: Earle Gell M.D.   On: 09/23/2013 19:43     Scheduled Meds: . heparin  5,000 Units Subcutaneous Q8H  . mirtazapine  15 mg Oral QHS  . pantoprazole (PROTONIX) IV  40 mg Intravenous Q24H   Continuous Infusions: . sodium chloride 100 mL/hr at 09/24/13 0404

## 2013-09-25 NOTE — Progress Notes (Signed)
INITIAL NUTRITION ASSESSMENT  DOCUMENTATION CODES Per approved criteria  -Severe malnutrition in the context of chronic illness   INTERVENTION: Patient to begin E5/20 (goal of 70 ml/hr)  plus 20%lipids at 20 ml/hr to provided 1958 kcal, 84 gm protein Monitor with pharmacy.  NUTRITION DIAGNOSIS: Inadequate oral intake related to poor appetite and nause as evidenced by hx.   Goal: Meet >90% estimated needs with tpn.  Monitor:  tpn with pharmacy, labs, weight trend  Reason for Assessment: Assessment of nutritional status and requirements and new TPN.  Possible OR tomorrow.    73 y.o. female  Admitting Dx: N&V (nausea and vomiting)  ASSESSMENT: 73 yo F with stage 4 colon cancer and malignant partial SBO, hepatic metastases, peritoneal carcinomatosis.    Patient has been receiving chemo and has been followed closely by the [place] [facility].    Patient meets criteria for Severe Malnutrition in the context of chronic illness AEB weight loss of 6% in the past month and intake <75% for > 1 month.  Height: Ht Readings from Last 1 Encounters:  09/23/13 5\' 5"  (1.651 m)    Weight: Wt Readings from Last 1 Encounters:  09/23/13 141 lb 12.1 oz (64.3 kg)    Ideal Body Weight: 125 bls  % Ideal Body Weight: 113  Wt Readings from Last 10 Encounters:  09/23/13 141 lb 12.1 oz (64.3 kg)  09/15/13 146 lb (66.225 kg)  09/14/13 144 lb 8 oz (65.545 kg)  08/31/13 150 lb 4.8 oz (68.176 kg)  08/29/13 150 lb (68.04 kg)  08/03/13 156 lb 14.4 oz (71.169 kg)  07/13/13 161 lb 12.8 oz (73.392 kg)  06/29/13 163 lb 3.2 oz (74.027 kg)  06/09/13 169 lb 1.6 oz (76.703 kg)  06/02/13 167 lb (75.751 kg)    Usual Body Weight: 179 lbs 2009, 169 lbs 3 1/2 months ago  % Usual Body Weight: 83  BMI:  Body mass index is 23.59 kg/(m^2).  Estimated Nutritional Needs: Kcal: 1900-2000 Protein: 80-90 gm protein Fluid: 1.9L daily  Skin: intact  Diet Order: NPO  EDUCATION NEEDS: -No  education needs identified at this time   Intake/Output Summary (Last 24 hours) at 09/25/13 1436 Last data filed at 09/25/13 0600  Gross per 24 hour  Intake 2713.34 ml  Output   1100 ml  Net 1613.34 ml    Last BM: 1/25  Labs:   Recent Labs Lab 09/23/13 1750 09/24/13 0620 09/25/13 0935  NA 136* 135* 139  K 3.3* 3.2* 3.3*  CL 96 99 103  CO2 26 24 20   BUN 17 17 17   CREATININE 0.48* 0.46* 0.40*  CALCIUM 8.7 8.0* 7.8*  GLUCOSE 114* 93 81    CBG (last 3)  No results found for this basename: GLUCAP,  in the last 72 hours  Scheduled Meds: . heparin  5,000 Units Subcutaneous Q8H  . insulin aspart  0-9 Units Subcutaneous Q6H  . mirtazapine  15 mg Oral QHS  . pantoprazole (PROTONIX) IV  40 mg Intravenous Q24H    Continuous Infusions: . sodium chloride    . Marland KitchenTPN (CLINIMIX-E) Adult     And  . fat emulsion      Past Medical History  Diagnosis Date  . Premature ventricular contractions   . Palpitations     occasional  . Hyperlipidemia   . Osteoarthritis   . Anxiety and depression   . Chronic insomnia   . Osteopenia 01/2012  . Diverticulosis 01/2013    by CT  . DDD (  degenerative disc disease), lumbar 01/2013    by CT (L5 B pars defects with 1cm slip, L5/S1 DDD with prominent B foraminal narrowing with mass effect on exiting L5 nerve root)  . GERD (gastroesophageal reflux disease)   . Asthma 06-01-13    no use of rescue inhalers in many years    Past Surgical History  Procedure Laterality Date  . Nsvd      x2  . Btl    . Total abdominal hysterectomy      due to endometriosis w endmetrioma  . Appendectomy    . Stress cadrio lite  04/26/1999    normal ef 76%  . Dexa osteopenia fem neck, spine  08/22/2002  . Abd/ultra sound within normal limits  05/08/2004  . Stress myoview  05/15/2006    normal  . L knee arthroscopy (other)  09/25/2008    Dr. Lorre Munroe  . R knee arthroscopy (other)  06/26/2009    lots of arthritis Dr. Mauri Pole  . Dexa  01/2012    osteopenia (T  score femur -1.9, forearm -1.5)  . Breast surgery Left 06-01-13    Left breast "chip" implanted as marker(none malignant)  . Colonoscopy N/A 06/02/2013    Procedure: COLONOSCOPY;  Surgeon: Jerene Bears, MD;  Location: [facility];  Service: Gastroenterology;  Laterality: N/A;    Antonieta Iba, RD, LDN Clinical Inpatient Dietitian Pager:  [phone] Weekend and after hours pager:  [phone] '

## 2013-09-25 NOTE — Progress Notes (Addendum)
PARENTERAL NUTRITION CONSULT NOTE - INITIAL  Pharmacy Consult for TNA Indication: bowel obstruction  Allergies  Allergen Reactions  . Adhesive [Tape] Other (See Comments)    Tears skin  . Amoxicillin     REACTION: RASH  . Clarithromycin     REACTION: GI UPSET  . Codeine Nausea Only and Palpitations    Patient Measurements: Height: _0  (165.1 cm) Weight: 141 lb 12.1 oz (64.3 kg) IBW/kg (Calculated) : 57 Adjusted Body Weight: 59 kg  Vital Signs: Temp: 98.1 F (36.7 C) (01/25 0641) Temp src: Oral (01/25 0641) BP: 124/52 mmHg (01/25 0641) Pulse Rate: 106 (01/25 0641) Intake/Output from previous day: 01/24 0701 - 01/25 0700 In: 2713.3 [P.O.:120; I.V.:2593.3] Out: 1100 [Urine:1100] Intake/Output from this shift:    Labs:  Recent Labs  09/23/13 1750 09/24/13 0620  WBC 5.1 5.7  HGB 10.9* 10.3*  HCT 33.9* 32.4*  PLT 148* 159     Recent Labs  09/23/13 1750 09/24/13 0620 09/24/13 1145  NA 136* 135*  --   K 3.3* 3.2*  --   CL 96 99  --   CO2 26 24  --   GLUCOSE 114* 93  --   BUN 17 17  --   CREATININE 0.48* 0.46*  --   CALCIUM 8.7 8.0*  --   PROT 6.9  --   --   ALBUMIN 3.3*  --   --   AST 19  --   --   ALT 14  --   --   ALKPHOS 120*  --   --   BILITOT 0.5  --   --   PREALBUMIN  --   --  11.3*   Estimated Creatinine Clearance: 58.9 ml/min (by C-G formula based on Cr of 0.46).   No results found for this basename: GLUCAP,  in the last 72 hours  Medical History: Past Medical History  Diagnosis Date  . Premature ventricular contractions   . Palpitations     occasional  . Hyperlipidemia   . Osteoarthritis   . Anxiety and depression   . Chronic insomnia   . Osteopenia 01/2012  . Diverticulosis 01/2013    by CT  . DDD (degenerative disc disease), lumbar 01/2013    by CT (L5 B pars defects with 1cm slip, L5/S1 DDD with prominent B foraminal narrowing with mass effect on exiting L5 nerve root)  . GERD (gastroesophageal reflux disease)   . Asthma  06-01-13    no use of rescue inhalers in many years    CBGs & Insulin requirements past 24 hours:  - CBGs not checked inpatient, glucose ok on BMET, no insulin ordered/used  Nutritional Goals:  - RD recs: Pending - Estimated needs: 70-85 g protein, 1800-2000 Kcal  - Clinimix E5/20 _1  ml/hr  + IVF 20% at 10 ml/hr will provide: 84 g protein, 1958 Kcal per day   Current nutrition:  - Diet: NPO - TNA: to start tonight at 1800 - mIVF: NS @ 100 ml/hr   Assessment:  73 yoF with metastatic colon cancer currently receiving chemotherapy with FOLFIRI presented 1/23 with intractable N/V. CT abd/pelvis with distal small bowel obstruction, peritoneal carcinomatosis and stable liver metastases. Surgery on board, plan OR 1/26. Pt with a 30lbs weight loss over the past 6 months, pre-albumin 11.3, and anticipated prolonged ileus - TNA ordered to start 1/25. Patient with an implanted port on R chest with only 1 access per RN. MD notified to create peripheral access for IVF since TNA will  be given via chest port.    Labs: Electrolytes: K+ 3.3, corrected calcium 8.26, Mag/Phos will be checked tomorrow Renal Function: Scr 0.40, UOP 0.7 ml/kg/hr Hepatic Function: AST/ALT wnl, Alk phos slightly elevated at 120 Pre-Albumin: 11.3 (1/24) Triglycerides: pending lab 1/26 CBGs: No h/o DM noted. CBGs okay on BMET   Plan:  At 1800 tonight  Start Clinimix E 5/20 @ 40 ml/hr  TNA to contain IV fat emulsion 20% at 10 ml/hr daily  Standard multivitamins and trace elements daily  Reduce IVF to 83m/hr.    Add sensitive SSI q6h - monitor CBGs   Calcium gluconate 1g IV x 1   MD ordered 3 runs of KCl already will order 2 more runs.   TNA labs Monday/Thursdays  Dietician consult  Pharmacy will follow up daily  TVanessa Roland PharmD, BCPS Pager: [phone] AM Pharmacy #: [id number]

## 2013-09-25 NOTE — Progress Notes (Signed)
N&V (nausea and vomiting)  Subjective: Kelly [name] is a 73 y.o. F with stage 4 colon cancer and a malignant pSBO. She did have a bowel movement overnight but remains distended with steady nausea.    Objective: Vital signs in last 24 hours: Temp:  [98.1 F (36.7 C)-98.4 F (36.9 C)] 98.1 F (36.7 C) (01/25 0641) Pulse Rate:  [97-106] 106 (01/25 0641) Resp:  [16] 16 (01/25 0641) BP: (121-124)/(52-64) 124/52 mmHg (01/25 0641) SpO2:  [96 %-100 %] 96 % (01/25 0641) Last BM Date: 09/21/13  Intake/Output from previous day: 01/24 0701 - 01/25 0700 In: 2713.3 [P.O.:120; I.V.:2593.3] Out: 1100 [Urine:1100] Intake/Output this shift:    General appearance: alert and cooperative GI: normal findings: soft, non-tender, distended   Lab Results:  Results for orders placed during the hospital encounter of 09/23/13 (from the past 24 hour(s))  PREALBUMIN     Status: Abnormal   Collection Time    09/24/13 11:45 AM      Result Value Range   Prealbumin 11.3 (*) 17.0 - 34.0 mg/dL  CBC     Status: Abnormal   Collection Time    09/25/13  9:35 AM      Result Value Range   WBC 6.7  4.0 - 10.5 K/uL   RBC 3.51 (*) 3.87 - 5.11 MIL/uL   Hemoglobin 10.5 (*) 12.0 - 15.0 g/dL   HCT 32.5 (*) 36.0 - 46.0 %   MCV 92.6  78.0 - 100.0 fL   MCH 29.9  26.0 - 34.0 pg   MCHC 32.3  30.0 - 36.0 g/dL   RDW 17.5 (*) 11.5 - 15.5 %   Platelets 225  150 - 400 K/uL     Studies/Results Radiology     MEDS, Scheduled . heparin  5,000 Units Subcutaneous Q8H  . mirtazapine  15 mg Oral QHS  . pantoprazole (PROTONIX) IV  40 mg Intravenous Q24H     Assessment: N&V (nausea and vomiting) Malignant SBO  Plan: Possible OR tomorrow.  Pt and family ok with this.  Will start TPN given low prealbumin and anticipated prolonged ileus.  Pt would like to discuss plan of care with Dr Benay Spice in the AM.  LOS: 2 days    Rosario Adie, [facility], [place] [phone]   09/25/2013 9:59  AM

## 2013-09-26 ENCOUNTER — Inpatient Hospital Stay (HOSPITAL_COMMUNITY): Payer: Medicare Other

## 2013-09-26 ENCOUNTER — Other Ambulatory Visit: Payer: Self-pay | Admitting: Urology

## 2013-09-26 DIAGNOSIS — R1110 Vomiting, unspecified: Secondary | ICD-10-CM

## 2013-09-26 DIAGNOSIS — C180 Malignant neoplasm of cecum: Secondary | ICD-10-CM

## 2013-09-26 DIAGNOSIS — E876 Hypokalemia: Secondary | ICD-10-CM

## 2013-09-26 DIAGNOSIS — K5669 Other intestinal obstruction: Secondary | ICD-10-CM

## 2013-09-26 DIAGNOSIS — C800 Disseminated malignant neoplasm, unspecified: Secondary | ICD-10-CM

## 2013-09-26 DIAGNOSIS — E46 Unspecified protein-calorie malnutrition: Secondary | ICD-10-CM

## 2013-09-26 LAB — CBC WITH DIFFERENTIAL/PLATELET
Basophils Absolute: 0 10*3/uL (ref 0.0–0.1)
Basophils Relative: 0 % (ref 0–1)
EOS ABS: 0 10*3/uL (ref 0.0–0.7)
Eosinophils Relative: 0 % (ref 0–5)
HCT: 33.6 % — ABNORMAL LOW (ref 36.0–46.0)
HEMOGLOBIN: 10.6 g/dL — AB (ref 12.0–15.0)
LYMPHS ABS: 1.9 10*3/uL (ref 0.7–4.0)
Lymphocytes Relative: 24 % (ref 12–46)
MCH: 29.3 pg (ref 26.0–34.0)
MCHC: 31.5 g/dL (ref 30.0–36.0)
MCV: 92.8 fL (ref 78.0–100.0)
MONOS PCT: 11 % (ref 3–12)
Monocytes Absolute: 0.8 10*3/uL (ref 0.1–1.0)
NEUTROS PCT: 64 % (ref 43–77)
Neutro Abs: 5.1 10*3/uL (ref 1.7–7.7)
PLATELETS: 238 10*3/uL (ref 150–400)
RBC: 3.62 MIL/uL — AB (ref 3.87–5.11)
RDW: 17.8 % — ABNORMAL HIGH (ref 11.5–15.5)
WBC: 7.8 10*3/uL (ref 4.0–10.5)

## 2013-09-26 LAB — GLUCOSE, CAPILLARY
GLUCOSE-CAPILLARY: 137 mg/dL — AB (ref 70–99)
GLUCOSE-CAPILLARY: 144 mg/dL — AB (ref 70–99)
GLUCOSE-CAPILLARY: 127 mg/dL — AB (ref 70–99)
Glucose-Capillary: 120 mg/dL — ABNORMAL HIGH (ref 70–99)

## 2013-09-26 LAB — COMPREHENSIVE METABOLIC PANEL
ALK PHOS: 97 U/L (ref 39–117)
ALT: 133 U/L — AB (ref 0–35)
AST: 240 U/L — ABNORMAL HIGH (ref 0–37)
Albumin: 2.5 g/dL — ABNORMAL LOW (ref 3.5–5.2)
BILIRUBIN TOTAL: 0.3 mg/dL (ref 0.3–1.2)
BUN: 16 mg/dL (ref 6–23)
CHLORIDE: 102 meq/L (ref 96–112)
CO2: 24 mEq/L (ref 19–32)
Calcium: 7.9 mg/dL — ABNORMAL LOW (ref 8.4–10.5)
Creatinine, Ser: 0.41 mg/dL — ABNORMAL LOW (ref 0.50–1.10)
GFR calc Af Amer: >90 mL/min (ref >90)
GFR calc non Af Amer: >90 mL/min (ref >90)
GLUCOSE: 141 mg/dL — AB (ref 70–99)
POTASSIUM: 3.3 meq/L — AB (ref 3.7–5.3)
SODIUM: 135 meq/L — AB (ref 137–147)
TOTAL PROTEIN: 5.3 g/dL — AB (ref 6.0–8.3)

## 2013-09-26 LAB — PREALBUMIN: Prealbumin: 7 mg/dL — ABNORMAL LOW (ref 17.0–34.0)

## 2013-09-26 LAB — MAGNESIUM: Magnesium: 1.9 mg/dL (ref 1.5–2.5)

## 2013-09-26 LAB — TRIGLYCERIDES: TRIGLYCERIDES: 56 mg/dL (ref <150)

## 2013-09-26 LAB — PHOSPHORUS: Phosphorus: 2 mg/dL — ABNORMAL LOW (ref 2.3–4.6)

## 2013-09-26 LAB — ABO/RH: ABO/RH(D): O POS

## 2013-09-26 MED ORDER — SODIUM CHLORIDE 0.9 % IJ SOLN: 10.0000 mL | INTRAMUSCULAR | Status: DC | PRN

## 2013-09-26 MED ORDER — ONDANSETRON HCL 4 MG/2ML IJ SOLN
4.0000 mg | INTRAMUSCULAR | Status: DC | PRN
  Administered 2013-09-26 (×2): 4 mg via INTRAVENOUS
  Filled 2013-09-26 (×3): qty 2

## 2013-09-26 MED ORDER — POTASSIUM CHLORIDE 10 MEQ/100ML IV SOLN
10.0000 meq | INTRAVENOUS | Status: AC
  Filled 2013-09-26 (×4): qty 100

## 2013-09-26 MED ORDER — POTASSIUM CHLORIDE 2 MEQ/ML IV SOLN
INTRAVENOUS | Status: AC
  Administered 2013-09-26 – 2013-09-28 (×2): via INTRAVENOUS
  Filled 2013-09-26 (×5): qty 1000

## 2013-09-26 MED ORDER — SODIUM CHLORIDE 0.9 % IJ SOLN
10.0000 mL | Freq: Two times a day (BID) | INTRAMUSCULAR | Status: DC
  Administered 2013-09-27 – 2013-09-28 (×2): 10 mL
  Administered 2013-09-28: 20 mL
  Administered 2013-09-30 – 2013-10-06 (×11): 10 mL

## 2013-09-26 MED ORDER — TRACE MINERALS CR-CU-F-FE-I-MN-MO-SE-ZN IV SOLN
INTRAVENOUS | Status: AC
  Administered 2013-09-26: 17:00:00 via INTRAVENOUS
  Filled 2013-09-26: qty 1000

## 2013-09-26 MED ORDER — POTASSIUM CHLORIDE 10 MEQ/100ML IV SOLN
10.0000 meq | INTRAVENOUS | Status: AC
  Administered 2013-09-26 – 2013-09-27 (×3): 10 meq via INTRAVENOUS
  Filled 2013-09-26 (×3): qty 100

## 2013-09-26 MED ORDER — HYDROMORPHONE HCL PF 1 MG/ML IJ SOLN: 0.5000 mg | INTRAMUSCULAR | Status: DC | PRN

## 2013-09-26 MED ORDER — FAT EMULSION 20 % IV EMUL
240.0000 mL | INTRAVENOUS | Status: AC
  Administered 2013-09-26: 240 mL via INTRAVENOUS
  Filled 2013-09-26: qty 250

## 2013-09-26 MED ORDER — ONDANSETRON HCL 4 MG PO TABS: 4.0000 mg | ORAL_TABLET | ORAL | Status: DC | PRN

## 2013-09-26 MED ORDER — MAGNESIUM SULFATE IN D5W 10-5 MG/ML-% IV SOLN
1.0000 g | Freq: Once | INTRAVENOUS | Status: AC
  Administered 2013-09-26: 1 g via INTRAVENOUS
  Filled 2013-09-26: qty 100

## 2013-09-26 MED ORDER — POTASSIUM PHOSPHATE DIBASIC 3 MMOLE/ML IV SOLN
10.0000 mmol | Freq: Once | INTRAVENOUS | Status: AC
  Administered 2013-09-26: 10 mmol via INTRAVENOUS
  Filled 2013-09-26: qty 3.33

## 2013-09-26 NOTE — Clinical Documentation Improvement (Signed)
  Patient admitted with metastatic GI cancer placed on TPN, persistant vomiting.   Completed evaluation by Registered Dietician states:"Patient meets criteria for Severe Malnutrition in the context of chronic illness AEB weight loss of 6% in the past month and intake <75% for > 1 month." If you agree with the secondary diagnosis of "Severe protein calorie malnutrition" please clarify in Notes and DC summary to illustrate this patient's severity of illness and risk of mortality. Thank you.  Thank You, Ezekiel Ina ,RN Clinical Documentation Specialist:  [phone]  [place] Information Management

## 2013-09-26 NOTE — Progress Notes (Signed)
Peripherally Inserted Central Catheter/Midline Placement  The IV Nurse has discussed with the patient and/or persons authorized to consent for the patient, the purpose of this procedure and the potential benefits and risks involved with this procedure.  The benefits include less needle sticks, lab draws from the catheter and patient may be discharged home with the catheter.  Risks include, but not limited to, infection, bleeding, blood clot (thrombus formation), and puncture of an artery; nerve damage and irregular heat beat.  Alternatives to this procedure were also discussed.  PICC/Midline Placement Documentation  PICC / Midline Double Lumen 86/38/17 PICC Left Basilic 43 cm 1 cm (Active)  Exposed Catheter (cm) 1 cm 09/26/2013  9:45 PM  Site Assessment Clean;Dry;Intact 09/26/2013  9:45 PM  Lumen #1 Status Flushed;Saline locked;Blood return noted 09/26/2013  9:45 PM  Lumen #2 Status Flushed;Saline locked;Blood return noted 09/26/2013  9:45 PM  Dressing Type Transparent 09/26/2013  9:45 PM  Dressing Status Clean;Dry;Intact;Antimicrobial disc in place 09/26/2013  9:45 PM  Dressing Change Due 10/03/13 09/26/2013  9:45 PM       Naydene Kamrowski, Nicolette Bang 09/26/2013, 10:10 PM

## 2013-09-26 NOTE — Progress Notes (Signed)
Had long discussion with patient and family.  Pt has acute worsening of malignant SBO in RLQ.  Although patient has seemed to open up a bit today, she has had problems on and off with obstructive symptoms since her diagnosis in October.  She has had to halt avastin, come off oxaliplatin due to allergy, and is now not receiving any chemo for stage IV colon cancer.  I think she is unlikely to be able to avoid surgery unless she wants to pursue hospice.  Although she does appear to have peritoneal disease, even if we can get up a diverting ileostomy, her clinical situatould improve.  Also, if I can place a g tube, she would have a means to drain should the peritoneal disease enlarge and obstruct elsewhere.  My goal would be to resect the ileocecal mass with right hemicolectomy if possible.  Based on her prior surgery and location of mass, I have asked urology to place a right ureteral stent.  She is at high risk of complications.  She has risk of bleeding, infection, leak, bowel injury, fistula, VTE, pneumonia, etc.  I advised patient and her family that we may be unable to safely get into her abdomen.  In this case, we would close and refer to hospice.  I have discussed also with Dr Benay Spice.

## 2013-09-26 NOTE — Progress Notes (Signed)
IP PROGRESS NOTE  Subjective:   She was admitted 09/23/2013 with nausea and vomiting. A CT on 09/23/2013 confirmed a distal small bowel obstruction. No change in liver metastases.  She was placed on intravenous hydration and is now on TNA. She continues to have intermittent vomiting. She is having bowel movements.  Objective: Vital signs in last 24 hours: Blood pressure 123/49, pulse 90, temperature 98.3 F (36.8 C), temperature source Oral, resp. rate 16, height $RemoveBe'5\' 5"'lASsLpHvN$  (1.651 m), weight 141 lb 12.1 oz (64.3 kg), SpO2 96.00%.  Intake/Output from previous day: 01/25 0701 - 14-Oct-2022 0700 In: 580.8 [I.V.:200; IV Piggyback:200; TPN:180.8] Out: 450 [Urine:450]  Physical Exam: Lungs: Clear bilaterally Cardiac: Regular rate and rhythm Abdomen: Mildly distended, nontender Extremities: No leg edema   Portacath/PICC-without erythema  Lab Results:  Recent Labs  09/25/13 0935 10-14-2013 0445  WBC 6.7 7.8  HGB 10.5* 10.6*  HCT 32.5* 33.6*  PLT 225 238    BMET  Recent Labs  09/25/13 0935 10/14/2013 0445  NA 139 135*  K 3.3* 3.3*  CL 103 102  CO2 20 24  GLUCOSE 81 141*  BUN 17 16  CREATININE 0.40* 0.41*  CALCIUM 7.8* 7.9*    Studies/Results: Dg Abd 2 Views  14-Oct-2013   CLINICAL DATA:  Small-bowel obstruction  EXAM: ABDOMEN - 2 VIEW  COMPARISON:  09/23/2013  FINDINGS: Minimal bowel gas is noted likely related to fluid-filled loops of small bowel. There is likely some persistent small bowel dilatation given the appearance. No free air is seen. No bony abnormality is noted.  IMPRESSION: Multiple fluid filled mildly dilated loops of small bowel.   Electronically Signed   By: Inez Catalina M.D.   On: 2013-10-14 12:39    Medications: I have reviewed the patient's current medications.  Assessment/Plan: 1. Stage IV colon cancer, colonoscopy 06/02/2013 found a mass at the cecum with a biopsy confirming adenocarcinoma positive K-ras mutation, no loss of expression of mismatch repair  proteins, microsatellite stable  Cycle 1 FOLFOX 06/15/2013  Cycle 2 FOLFOX/Avastin 06/29/2013  Cycle 3 FOLFOX/Avastin 07/21/2013  Cycle 4 FOLFOX/Avastin 08/03/2013  CT 08/18/2013 with mild improvement of hepatic metastases and peritoneal/omental metastases, the distal ileum dilatation. Treatment was switched to FOLFIRI secondary to persistent obstructive symptoms and an allergic reaction to oxaliplatin  Cycle 1 FOLFIRI 08/31/2013  Cycle 2 FOLFIRI 09/14/2013 2. 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 Acute nausea/vomiting 06/08/2013-? Related to partial obstruction  4. History of an arrhythmia  5. Depression -now taking Remeron, improved  6. Anorexia -persistent  7. Port-A-Cath placement 06/14/2013  8. neutropenia secondary chemotherapy, Neulasta was added with cycle 2 FOLFOX  9. Allergic reaction at the completion of the oxaliplatin infusion with cycle 4  10. Early satiety-likely related to gastroparesis/ileus,/partial bowel thrush  11. Persistent nausea/vomiting beginning 09/20/2013-unremarkable plain x-ray evaluation 09/21/2013 , CT 09/24/2011 confirmed a distal small bowel obstruction 12. Malnutrition-maintained on TNA   She has a bowel obstruction secondary to the primary cecal tumor or carcinomatosis. I discussed the case with doctors Marcello Moores and [place]. I reviewed treatment options with Ms. Greif and her family. It is unlikely the obstruction will resolve with bowel rest or further chemotherapy.  She understands the surgeons may not be able to resect the obstructed area of bowel or perform a bypass procedure. She may require a colostomy. I agree with the plan for surgery.   I will continue following Ms. Femia in the hospital and we will arrange for outpatient followup. We  will consider additional systemic therapy based on the surgical findings and her recovery.      LOS: 3 days   Elnoria Livingston, Dominica Severin  09/26/2013, 4:15 PM

## 2013-09-26 NOTE — Progress Notes (Signed)
Patient ID: Kelly [name], female   DOB: 09/17/1940, 73 y.o.   MRN: [id number]    Subjective: Pt just had an episode of emesis before I walked in, but is now having some BMs.  Had one yesterday and one this morning.    Objective: Vital signs in last 24 hours: Temp:  [97.6 F (36.4 C)-98.3 F (36.8 C)] 98.3 F (36.8 C) (01/26 0617) Pulse Rate:  [90-105] 90 (01/26 0617) Resp:  [16] 16 (01/26 0617) BP: (123-136)/(49-63) 123/49 mmHg (01/26 0617) SpO2:  [96 %-97 %] 96 % (01/26 0617) Last BM Date: 09/22/13  Intake/Output from previous day: 01/25 0701 - 01/26 0700 In: 580.8 [I.V.:200; IV Piggyback:200; TPN:180.8] Out: 450 [Urine:450] Intake/Output this shift:    PE: Abd: soft, but distended, and somewhat hard as if tumor burden almost palpable.  Few BS.  Minimally tender Heart: regular Lungs: CTAB, PAC in RUC  Lab Results:   Recent Labs  09/25/13 0935 09/26/13 0445  WBC 6.7 7.8  HGB 10.5* 10.6*  HCT 32.5* 33.6*  PLT 225 238   BMET  Recent Labs  09/25/13 0935 09/26/13 0445  NA 139 135*  K 3.3* 3.3*  CL 103 102  CO2 20 24  GLUCOSE 81 141*  BUN 17 16  CREATININE 0.40* 0.41*  CALCIUM 7.8* 7.9*   PT/INR No results found for this basename: LABPROT, INR,  in the last 72 hours CMP     Component Value Date/Time   NA 135* 09/26/2013 0445   NA 140 09/14/2013 0928   K 3.3* 09/26/2013 0445   K 3.1* 09/14/2013 0928   CL 102 09/26/2013 0445   CO2 24 09/26/2013 0445   CO2 27 09/14/2013 0928   GLUCOSE 141* 09/26/2013 0445   GLUCOSE 102 09/14/2013 0928   BUN 16 09/26/2013 0445   BUN 9.4 09/14/2013 0928   CREATININE 0.41* 09/26/2013 0445   CREATININE 0.6 09/14/2013 0928   CALCIUM 7.9* 09/26/2013 0445   CALCIUM 9.3 09/14/2013 0928   PROT 5.3* 09/26/2013 0445   PROT 7.0 09/14/2013 0928   ALBUMIN 2.5* 09/26/2013 0445   ALBUMIN 3.2* 09/14/2013 0928   AST 240* 09/26/2013 0445   AST 20 09/14/2013 0928   ALT 133* 09/26/2013 0445   ALT 10 09/14/2013 0928   ALKPHOS 97 09/26/2013 0445   ALKPHOS 106 09/14/2013 0928   BILITOT 0.3 09/26/2013 0445   BILITOT 0.52 09/14/2013 0928   GFRNONAA >90 09/26/2013 0445   GFRAA >90 09/26/2013 0445   Lipase     Component Value Date/Time   LIPASE 12 09/21/2013 1550       Studies/Results: No results found.  Anti-infectives: Anti-infectives   None       Assessment/Plan  1. Malignant PSBO 2. Stage 4 colon cancer 3. PCM/TNA 4. Hypokalemia  Plan: 1. Will repeat some plain abdominal films today to evaluate her abdomen given new info of BMs.  Suspect she has a PSBO that will still require surgical intervention given continued nausea and emesis despite the passage of some stool. 2. I have had a lengthy discussion with the patient, her husband, and their daughter about surgery, need for ureteral stent, and the possibilities of what we may or may not be able to do.  They understand.  I have also discussed a few of the complications, but Dr. Barry Dienes is going to see them later and have a lengthier discussion about all of this with them. 3. Cont TNA for nutrition 4. Replace K 5. Type and screen already done,  hold Heparin after MN tonight for probable OR tomorrow. 6. We are arranging with urology to have a right ureteral stent placed at time of OR tomorrow. 7. Check labs in AM.   LOS: 3 days    Alonnie Bieker E 09/26/2013, 10:29 AM Pager: [phone]

## 2013-09-26 NOTE — Progress Notes (Addendum)
[facility] PROGRESS NOTE  Kelly [name] MWN:[id number] DOB: 01-05-1941 DOA: 09/23/2013 PCP: Ria Bush, MD  Brief narrative: 73 y.o. Very pleasant female with past medical history of colon ca with hepatic metastases, peritoneal carcinomatosis who presented to [facility] ED 09/24/2013 who 09/25/2013 with intractable nausea and vomiting. She was ultimately found to have distal small bowel obstruction, with increased small bowel dilatation, peritoneal carcinomatosis with mild increase in size of peritoneal soft tissue masses in the right pelvis at site of distal small bowel obstruction, stable liver metastases.   Assessment/Plan:   Principal Problem:  N&V (nausea and vomiting)  - secondary to small bowel obstruction secondary to metastatic colon cancer, peritoneal carcinomatosis  - continue supportive care with IV fluids, analgesia and antiemetics  - low prealbumin level at 11 (normal range starts at 17); started TNA - appreciate surgery following  - plan for surgery in am  Active Problems:  Severe protein calorie nutrition  - secondary to peritoneal carcinomatosis, colon ca, SBO  -- continue TNA  Code Status: full code  Family Communication: family at the bedside  Disposition Plan: home when stable   Consultants:  Oncology  Surgery  Procedures:  None  Antibiotics:  None    Leisa Lenz, MD  [facility] Pager [phone]  If 7PM-7AM, please contact night-coverage [email or web address] Password [facility] 09/26/2013, 4:41 PM   LOS: 3 days    HPI/Subjective: Still has nausea and vomiting.  Objective: Filed Vitals:   09/25/13 0641 09/25/13 1305 09/25/13 2023 09/26/13 0617  BP: 124/52 123/53 136/63 123/49  Pulse: 106 105 99 90  Temp: 98.1 F (36.7 C) 97.6 F (36.4 C) 98.2 F (36.8 C) 98.3 F (36.8 C)  TempSrc: Oral Oral Oral Oral  Resp: 16 16 16 16   Height:      Weight:      SpO2: 96% 96% 97% 96%    Intake/Output Summary (Last 24 hours) at 09/26/13 1641 Last  data filed at 09/26/13 0600  Gross per 24 hour  Intake 380.84 ml  Output    300 ml  Net  80.84 ml    Exam:   General:  Pt is alert, follows commands appropriately, not in acute distress  Cardiovascular: Regular rate and rhythm, S1/S2 appreciated  Respiratory: Clear to auscultation bilaterally, no wheezing, no crackles, no rhonchi  Abdomen: Soft, some distention, bowel sounds present, no guarding  Extremities: No edema, pulses DP and PT palpable bilaterally  Neuro: Grossly nonfocal  Data Reviewed: Basic Metabolic Panel:  Recent Labs Lab 09/21/13 1550 09/23/13 1750 09/24/13 0620 09/25/13 0935 09/26/13 0445  NA 136* 136* 135* 139 135*  K 3.9 3.3* 3.2* 3.3* 3.3*  CL 97 96 99 103 102  CO2 20 26 24 20 24   GLUCOSE 141* 114* 93 81 141*  BUN 13 17 17 17 16   CREATININE 0.50 0.48* 0.46* 0.40* 0.41*  CALCIUM 8.9 8.7 8.0* 7.8* 7.9*  MG  --   --   --   --  1.9  PHOS  --   --   --   --  2.0*   Liver Function Tests:  Recent Labs Lab 09/21/13 1550 09/23/13 1750 09/26/13 0445  AST 20 19 240*  ALT 14 14 133*  ALKPHOS 119* 120* 97  BILITOT 0.9 0.5 0.3  PROT 7.3 6.9 5.3*  ALBUMIN 3.5 3.3* 2.5*    Recent Labs Lab 09/21/13 1550  LIPASE 12   No results found for this basename: AMMONIA,  in the last 168 hours CBC:  Recent Labs  Lab 09/21/13 1550 09/23/13 1750 09/24/13 0620 09/25/13 0935 09/26/13 0445  WBC 5.5 5.1 5.7 6.7 7.8  NEUTROABS 3.4 2.9  --   --  5.1  HGB 12.8 10.9* 10.3* 10.5* 10.6*  HCT 39.4 33.9* 32.4* 32.5* 33.6*  MCV 89.1 91.1 91.8 92.6 92.8  PLT PLATELET CLUMPS NOTED ON SMEAR, COUNT APPEARS INCREASED 148* 159 225 238   Cardiac Enzymes:  Recent Labs Lab 09/21/13 1550  TROPONINI <0.30   BNP: No components found with this basename: POCBNP,  CBG:  Recent Labs Lab 09/25/13 2336 09/26/13 0615 09/26/13 1147  GLUCAP 109* 137* 144*    No results found for this or any previous visit (from the past 240 hour(s)).   Studies: Dg Abd 2  Views  09/26/2013   CLINICAL DATA:  Small-bowel obstruction  EXAM: ABDOMEN - 2 VIEW  COMPARISON:  09/23/2013  FINDINGS: Minimal bowel gas is noted likely related to fluid-filled loops of small bowel. There is likely some persistent small bowel dilatation given the appearance. No free air is seen. No bony abnormality is noted.  IMPRESSION: Multiple fluid filled mildly dilated loops of small bowel.   Electronically Signed   By: Inez Catalina M.D.   On: 09/26/2013 12:39    Scheduled Meds: . heparin  5,000 Units Subcutaneous Q8H  . insulin aspart  0-9 Units Subcutaneous Q6H  . mirtazapine  15 mg Oral QHS  . pantoprazole (PROTONIX) IV  40 mg Intravenous Q24H  . potassium phosphate IVPB (mmol)  10 mmol Intravenous Once  . sodium chloride  10-40 mL Intracatheter Q12H   Continuous Infusions: . Marland KitchenTPN (CLINIMIX-E) Adult 40 mL/hr at 09/25/13 1743   And  . fat emulsion 240 mL (09/25/13 1743)  . Marland KitchenTPN (CLINIMIX-E) Adult     And  . fat emulsion    . sodium chloride 0.9 % 1,000 mL with potassium chloride 20 mEq infusion

## 2013-09-26 NOTE — Progress Notes (Signed)
09/26/13 1402 Spoke with IV team RN to find out about when PICC will be placed. RN aware that order was placed on Sunday and patient is in need of PICC. RN states pt is on the list. This was the second conversation today with IV team. First call was at 7:30, Second call was at 1400.

## 2013-09-26 NOTE — Progress Notes (Signed)
PARENTERAL NUTRITION CONSULT NOTE - Follow up  Pharmacy Consult for TNA Indication: bowel obstruction  Allergies  Allergen Reactions  . Adhesive [Tape] Other (See Comments)    Tears skin  . Amoxicillin     REACTION: RASH  . Clarithromycin     REACTION: GI UPSET  . Codeine Nausea Only and Palpitations    Patient Measurements: Height: 5' 5" (165.1 cm) Weight: 141 lb 12.1 oz (64.3 kg) IBW/kg (Calculated) : 57 Adjusted Body Weight: 59 kg  Vital Signs: Temp: 98.3 F (36.8 C) (01/26 0617) Temp src: Oral (01/26 0617) BP: 123/49 mmHg (01/26 0617) Pulse Rate: 90 (01/26 0617) Intake/Output from previous day: 01/25 0701 - 01/26 0700 In: 580.8 [I.V.:200; IV Piggyback:200; TPN:180.8] Out: 450 [Urine:450] Intake/Output from this shift:    Labs:  Recent Labs  09/24/13 0620 09/25/13 0935 09/26/13 0445  WBC 5.7 6.7 7.8  HGB 10.3* 10.5* 10.6*  HCT 32.4* 32.5* 33.6*  PLT 159 225 238     Recent Labs  09/23/13 1750 09/24/13 0620 09/24/13 1145 09/25/13 0935 09/26/13 0445  NA 136* 135*  --  139 135*  K 3.3* 3.2*  --  3.3* 3.3*  CL 96 99  --  103 102  CO2 26 24  --  20 24  GLUCOSE 114* 93  --  81 141*  BUN 17 17  --  17 16  CREATININE 0.48* 0.46*  --  0.40* 0.41*  CALCIUM 8.7 8.0*  --  7.8* 7.9*  MG  --   --   --   --  1.9  PHOS  --   --   --   --  2.0*  PROT 6.9  --   --   --  5.3*  ALBUMIN 3.3*  --   --   --  2.5*  AST 19  --   --   --  240*  ALT 14  --   --   --  133*  ALKPHOS 120*  --   --   --  97  BILITOT 0.5  --   --   --  0.3  PREALBUMIN  --   --  11.3*  --   --   TRIG  --   --   --   --  56   Estimated Creatinine Clearance: 58.9 ml/min (by C-G formula based on Cr of 0.41).    Recent Labs  09/25/13 2336 09/26/13 0615  GLUCAP 109* 137*    CBGs & Insulin requirements past 24 hours:  CBGs 109-137, 1 unit insulin used  Nutritional Goals:  - RD recs: Kcal: 1900-2000 ; Protein: 80-90 gm protein ; Fluid: 1.9L daily - PharmD Estimated needs: 70-85 g  protein, 1800-2000 Kcal  - Clinimix E5/20 @70 ml/hr  + IVF 20% at 10 ml/hr will provide: 84 g protein, 1958 Kcal per day   Current nutrition:  - Diet: NPO - TNA: Clinimix E5/20 at 40 mL/hr + 20% fat emulsion at 10 mL/hr - mIVF: NS @ 60 ml/hr, noted MD changed IVF to NS with 20 mEq KCl @ 60 mL/hr to start this AM   Assessment:  72 yoF with metastatic colon cancer currently receiving chemotherapy with FOLFIRI presented 1/23 with intractable N/V. CT abd/pelvis with distal small bowel obstruction, peritoneal carcinomatosis and stable liver metastases. Surgery on board, plan OR 1/27. Pt with a 30lbs weight loss over the past 6 months, pre-albumin 11.3, and anticipated prolonged ileus - TNA ordered to start 1/25. Patient with an implanted port   on R chest with only 1 access per RN. MD notified to create peripheral access for IVF since TNA will be given via chest port.    Labs: Electrolytes: K+ 3.3 (MD ordered 10 mEq KCl runs x 4 and added 20 mEq KCl to IVF which will deliver 28.8 mEq KCl over 24h), Na 135 (can not be corrected in TNA), CCa 9.1, Mag 1.9, Phos 2.0 Renal Function: Scr 0.41, UOP decreased to 0.3 ml/kg/hr Hepatic Function: AST/ALT with new elevation, Alk phos now WNL Pre-Albumin: 11.3 (1/24) Triglycerides: 56 (1/26) CBGs: <150   Plan:  At 1800 tonight  Continue Clinimix E 5/20 @ 40 ml/hr  Will not increase TNA rate due to electrolyte abnormalities  IV fat emulsion 20% at 10 ml/hr daily  TNA to contain standard multivitamins and trace elements daily   Continue IVF at 60ml/hr    Magnesium sulfate 1g x 1 today  KPhos 10 mmol x 1 today  Continue sensitive SSI q6h, q6h CBGs   TNA labs Monday/Thursdays  BMET in AM  Pharmacy will follow up daily   Thank you for the consult.  Jesse Mack, PharmD, BCPS Pager: [phone] Pharmacy: [phone] 09/26/2013 11:22 AM   

## 2013-09-27 ENCOUNTER — Inpatient Hospital Stay (HOSPITAL_COMMUNITY): Payer: Medicare Other

## 2013-09-27 ENCOUNTER — Encounter (HOSPITAL_COMMUNITY): Payer: Medicare Other | Admitting: Certified Registered Nurse Anesthetist

## 2013-09-27 ENCOUNTER — Inpatient Hospital Stay (HOSPITAL_COMMUNITY): Payer: Medicare Other | Admitting: Certified Registered Nurse Anesthetist

## 2013-09-27 ENCOUNTER — Encounter (HOSPITAL_COMMUNITY): Admission: AD | Disposition: A | Discharge status: Home Health | Payer: Medicare Other | Source: Ambulatory Visit

## 2013-09-27 HISTORY — PX: CYSTOSCOPY W/ RETROGRADES: SHX1426

## 2013-09-27 HISTORY — PX: LAPAROSCOPIC SMALL BOWEL RESECTION: SHX5929

## 2013-09-27 HISTORY — PX: JEJUNOSTOMY: SHX313

## 2013-09-27 HISTORY — PX: CYSTOSCOPY WITH STENT PLACEMENT: SHX5790

## 2013-09-27 LAB — GLUCOSE, CAPILLARY
GLUCOSE-CAPILLARY: 165 mg/dL — AB (ref 70–99)
Glucose-Capillary: 109 mg/dL — ABNORMAL HIGH (ref 70–99)

## 2013-09-27 LAB — PHOSPHORUS: PHOSPHORUS: 3 mg/dL (ref 2.3–4.6)

## 2013-09-27 LAB — BASIC METABOLIC PANEL
BUN: 7 mg/dL (ref 6–23)
CO2: 26 meq/L (ref 19–32)
CREATININE: 0.36 mg/dL — AB (ref 0.50–1.10)
Calcium: 8 mg/dL — ABNORMAL LOW (ref 8.4–10.5)
Chloride: 102 mEq/L (ref 96–112)
GFR calc non Af Amer: >90 mL/min (ref >90)
Glucose, Bld: 128 mg/dL — ABNORMAL HIGH (ref 70–99)
Potassium: 3.7 mEq/L (ref 3.7–5.3)
SODIUM: 137 meq/L (ref 137–147)

## 2013-09-27 LAB — CBC
HCT: 33.7 % — ABNORMAL LOW (ref 36.0–46.0)
Hemoglobin: 10.8 g/dL — ABNORMAL LOW (ref 12.0–15.0)
MCH: 29.5 pg (ref 26.0–34.0)
MCHC: 32 g/dL (ref 30.0–36.0)
MCV: 92.1 fL (ref 78.0–100.0)
PLATELETS: 240 10*3/uL (ref 150–400)
RBC: 3.66 MIL/uL — ABNORMAL LOW (ref 3.87–5.11)
RDW: 18.1 % — ABNORMAL HIGH (ref 11.5–15.5)
WBC: 9.3 10*3/uL (ref 4.0–10.5)

## 2013-09-27 LAB — SURGICAL PCR SCREEN
MRSA, PCR: NEGATIVE
Staphylococcus aureus: NEGATIVE

## 2013-09-27 LAB — PREPARE RBC (CROSSMATCH)

## 2013-09-27 SURGERY — EXCISION, SMALL INTESTINE, LAPAROSCOPIC
Anesthesia: General | Site: Ureter | Laterality: Right

## 2013-09-27 MED ORDER — DEXAMETHASONE SODIUM PHOSPHATE 10 MG/ML IJ SOLN
INTRAMUSCULAR | Status: AC
  Filled 2013-09-27: qty 1

## 2013-09-27 MED ORDER — ONDANSETRON HCL 4 MG/2ML IJ SOLN: 4.0000 mg | Freq: Four times a day (QID) | INTRAMUSCULAR | Status: DC | PRN

## 2013-09-27 MED ORDER — ONDANSETRON HCL 4 MG/2ML IJ SOLN
INTRAMUSCULAR | Status: AC
  Filled 2013-09-27: qty 2

## 2013-09-27 MED ORDER — STERILE WATER FOR IRRIGATION IR SOLN
Status: DC | PRN
  Administered 2013-09-27: 1000 mL

## 2013-09-27 MED ORDER — MIDAZOLAM HCL 2 MG/2ML IJ SOLN
INTRAMUSCULAR | Status: AC
  Filled 2013-09-27: qty 2

## 2013-09-27 MED ORDER — DIPHENHYDRAMINE HCL 50 MG/ML IJ SOLN: 12.5000 mg | Freq: Four times a day (QID) | INTRAMUSCULAR | Status: DC | PRN

## 2013-09-27 MED ORDER — FENTANYL CITRATE 0.05 MG/ML IJ SOLN
INTRAMUSCULAR | Status: AC
  Filled 2013-09-27: qty 5

## 2013-09-27 MED ORDER — ROCURONIUM BROMIDE 100 MG/10ML IV SOLN
INTRAVENOUS | Status: AC
  Filled 2013-09-27: qty 1

## 2013-09-27 MED ORDER — HYDROMORPHONE 0.3 MG/ML IV SOLN
INTRAVENOUS | Status: DC
  Administered 2013-09-27: 21:00:00 via INTRAVENOUS
  Administered 2013-09-27: 5.59 mg via INTRAVENOUS
  Administered 2013-09-28: 3.99 mg via INTRAVENOUS
  Administered 2013-09-28: 4.99 mg via INTRAVENOUS
  Administered 2013-09-28 (×2): via INTRAVENOUS
  Administered 2013-09-28: 2.99 mg via INTRAVENOUS
  Administered 2013-09-28: 1.99 mg via INTRAVENOUS
  Administered 2013-09-29: 0.3 mg via INTRAVENOUS
  Administered 2013-09-29: 1.49 mg via INTRAVENOUS
  Administered 2013-09-29 (×2): 1.99 mg via INTRAVENOUS
  Administered 2013-09-29: 0.3 mg via INTRAVENOUS
  Administered 2013-09-30: 0.753 mg via INTRAVENOUS
  Administered 2013-09-30: 0.5 mg via INTRAVENOUS
  Administered 2013-09-30: 0.3 mg via INTRAVENOUS
  Administered 2013-09-30: 21:00:00 via INTRAVENOUS
  Administered 2013-10-01: 1.49 mg via INTRAVENOUS
  Administered 2013-10-01: 1.99 mg via INTRAVENOUS
  Administered 2013-10-01 (×2): 1 mg via INTRAVENOUS
  Administered 2013-10-01 – 2013-10-02 (×3): 0.5 mg via INTRAVENOUS
  Administered 2013-10-02: 0.999 mg via INTRAVENOUS
  Administered 2013-10-02: 0.5 mg via INTRAVENOUS
  Administered 2013-10-02: 0.99 mg via INTRAVENOUS
  Administered 2013-10-02: 1 mg via INTRAVENOUS
  Administered 2013-10-02: via INTRAVENOUS
  Administered 2013-10-03 (×2): 0.5 mg via INTRAVENOUS
  Administered 2013-10-03: 14:00:00 via INTRAVENOUS
  Administered 2013-10-03: 0.5 mg via INTRAVENOUS
  Administered 2013-10-03: 1.49 mg via INTRAVENOUS
  Administered 2013-10-04: 0.1 mg via INTRAVENOUS
  Administered 2013-10-04: 1.5 mg via INTRAVENOUS
  Administered 2013-10-04: 0.09 mg via INTRAVENOUS
  Administered 2013-10-05: 0.3 mg via INTRAVENOUS
  Filled 2013-09-27 (×6): qty 25

## 2013-09-27 MED ORDER — GLYCOPYRROLATE 0.2 MG/ML IJ SOLN
INTRAMUSCULAR | Status: DC | PRN
  Administered 2013-09-27: .6 mg via INTRAVENOUS

## 2013-09-27 MED ORDER — ERTAPENEM SODIUM 1 G IJ SOLR
INTRAMUSCULAR | Status: AC
  Filled 2013-09-27: qty 1

## 2013-09-27 MED ORDER — DEXAMETHASONE SODIUM PHOSPHATE 10 MG/ML IJ SOLN
INTRAMUSCULAR | Status: DC | PRN
  Administered 2013-09-27: 10 mg via INTRAVENOUS

## 2013-09-27 MED ORDER — FENTANYL CITRATE 0.05 MG/ML IJ SOLN
25.0000 ug | INTRAMUSCULAR | Status: AC | PRN
  Administered 2013-09-27: 50 ug via INTRAVENOUS
  Administered 2013-09-27 (×6): 25 ug via INTRAVENOUS

## 2013-09-27 MED ORDER — SODIUM CHLORIDE 0.9 % IV SOLN
1.0000 g | INTRAVENOUS | Status: AC
  Administered 2013-09-27: 1 g via INTRAVENOUS
  Filled 2013-09-27: qty 1

## 2013-09-27 MED ORDER — EPHEDRINE SULFATE 50 MG/ML IJ SOLN
INTRAMUSCULAR | Status: DC | PRN
  Administered 2013-09-27: 5 mg via INTRAVENOUS

## 2013-09-27 MED ORDER — ROCURONIUM BROMIDE 100 MG/10ML IV SOLN
INTRAVENOUS | Status: DC | PRN
  Administered 2013-09-27: 20 mg via INTRAVENOUS
  Administered 2013-09-27 (×3): 10 mg via INTRAVENOUS

## 2013-09-27 MED ORDER — FENTANYL CITRATE 0.05 MG/ML IJ SOLN
INTRAMUSCULAR | Status: DC | PRN
  Administered 2013-09-27 (×6): 25 ug via INTRAVENOUS
  Administered 2013-09-27: 100 ug via INTRAVENOUS

## 2013-09-27 MED ORDER — NEOSTIGMINE METHYLSULFATE 1 MG/ML IJ SOLN
INTRAMUSCULAR | Status: AC
  Filled 2013-09-27: qty 10

## 2013-09-27 MED ORDER — DIPHENHYDRAMINE HCL 12.5 MG/5ML PO ELIX: 12.5000 mg | ORAL_SOLUTION | Freq: Four times a day (QID) | ORAL | Status: DC | PRN

## 2013-09-27 MED ORDER — HYDROMORPHONE 0.3 MG/ML IV SOLN
INTRAVENOUS | Status: AC
  Filled 2013-09-27: qty 25

## 2013-09-27 MED ORDER — NEOSTIGMINE METHYLSULFATE 1 MG/ML IJ SOLN
INTRAMUSCULAR | Status: DC | PRN
  Administered 2013-09-27: 4 mg via INTRAVENOUS

## 2013-09-27 MED ORDER — GLYCOPYRROLATE 0.2 MG/ML IJ SOLN
INTRAMUSCULAR | Status: AC
  Filled 2013-09-27: qty 3

## 2013-09-27 MED ORDER — 0.9 % SODIUM CHLORIDE (POUR BTL) OPTIME
TOPICAL | Status: DC | PRN
  Administered 2013-09-27: 4000 mL

## 2013-09-27 MED ORDER — MEPERIDINE HCL 50 MG/ML IJ SOLN: 6.2500 mg | INTRAMUSCULAR | Status: DC | PRN

## 2013-09-27 MED ORDER — IOHEXOL 300 MG/ML  SOLN
INTRAMUSCULAR | Status: DC | PRN
  Administered 2013-09-27: 6 mL via INTRAVENOUS

## 2013-09-27 MED ORDER — PROPOFOL 10 MG/ML IV BOLUS
INTRAVENOUS | Status: AC
  Filled 2013-09-27: qty 20

## 2013-09-27 MED ORDER — FAT EMULSION 20 % IV EMUL
240.0000 mL | INTRAVENOUS | Status: AC
  Administered 2013-09-27: 240 mL via INTRAVENOUS
  Filled 2013-09-27: qty 250

## 2013-09-27 MED ORDER — SUCCINYLCHOLINE CHLORIDE 20 MG/ML IJ SOLN
INTRAMUSCULAR | Status: DC | PRN
  Administered 2013-09-27: 100 mg via INTRAVENOUS

## 2013-09-27 MED ORDER — FENTANYL CITRATE 0.05 MG/ML IJ SOLN
INTRAMUSCULAR | Status: AC
  Filled 2013-09-27: qty 2

## 2013-09-27 MED ORDER — HYDROMORPHONE 0.3 MG/ML IV SOLN
INTRAVENOUS | Status: DC
  Administered 2013-09-27: 0.3 mg via INTRAVENOUS

## 2013-09-27 MED ORDER — NALOXONE HCL 0.4 MG/ML IJ SOLN: 0.4000 mg | INTRAMUSCULAR | Status: DC | PRN

## 2013-09-27 MED ORDER — ONDANSETRON HCL 4 MG/2ML IJ SOLN
INTRAMUSCULAR | Status: DC | PRN
  Administered 2013-09-27: 4 mg via INTRAVENOUS

## 2013-09-27 MED ORDER — PROMETHAZINE HCL 25 MG/ML IJ SOLN: 6.2500 mg | INTRAMUSCULAR | Status: DC | PRN

## 2013-09-27 MED ORDER — TRACE MINERALS CR-CU-F-FE-I-MN-MO-SE-ZN IV SOLN
INTRAVENOUS | Status: AC
  Administered 2013-09-27: 17:00:00 via INTRAVENOUS
  Administered 2013-09-27: 40 mL/h via INTRAVENOUS
  Filled 2013-09-27: qty 1000

## 2013-09-27 MED ORDER — MIDAZOLAM HCL 5 MG/5ML IJ SOLN
INTRAMUSCULAR | Status: DC | PRN
  Administered 2013-09-27: 2 mg via INTRAVENOUS

## 2013-09-27 MED ORDER — VITAMINS A & D EX OINT
TOPICAL_OINTMENT | CUTANEOUS | Status: AC
  Administered 2013-09-27: 18:00:00
  Filled 2013-09-27: qty 5

## 2013-09-27 MED ORDER — POTASSIUM CHLORIDE IN NACL 20-0.9 MEQ/L-% IV SOLN
INTRAVENOUS | Status: DC | PRN
  Administered 2013-09-27: 60 mL/h via INTRAVENOUS

## 2013-09-27 MED ORDER — LACTATED RINGERS IV SOLN
INTRAVENOUS | Status: DC | PRN
  Administered 2013-09-27 (×2): via INTRAVENOUS

## 2013-09-27 MED ORDER — KETAMINE HCL 10 MG/ML IJ SOLN
INTRAMUSCULAR | Status: DC | PRN
  Administered 2013-09-27: 20 mg via INTRAVENOUS

## 2013-09-27 MED ORDER — SODIUM CHLORIDE 0.9 % IJ SOLN: 9.0000 mL | INTRAMUSCULAR | Status: DC | PRN

## 2013-09-27 MED ORDER — PROPOFOL 10 MG/ML IV BOLUS
INTRAVENOUS | Status: DC | PRN
  Administered 2013-09-27: 120 mg via INTRAVENOUS

## 2013-09-27 SURGICAL SUPPLY — 124 items
ADAPTER GOLDBERG URETERAL (ADAPTER) ×4 IMPLANT
APPLIER CLIP 5 13 M/L LIGAMAX5 (MISCELLANEOUS)
APPLIER CLIP ROT 10 11.4 M/L (STAPLE)
BAG URINE DRAINAGE (UROLOGICAL SUPPLIES) ×4 IMPLANT
BLADE EXTENDED COATED 6.5IN (ELECTRODE) ×4 IMPLANT
BLADE HEX COATED 2.75 (ELECTRODE) ×8 IMPLANT
BLADE SURG SZ10 CARB STEEL (BLADE) ×4 IMPLANT
CABLE HIGH FREQUENCY MONO STRZ (ELECTRODE) ×4 IMPLANT
CANISTER SUCTION 2500CC (MISCELLANEOUS) ×4 IMPLANT
CATH FOLEY 2WAY SLVR  5CC 16FR (CATHETERS) ×2
CATH FOLEY 2WAY SLVR  5CC 18FR (CATHETERS)
CATH FOLEY 2WAY SLVR 5CC 16FR (CATHETERS) ×2 IMPLANT
CATH FOLEY 2WAY SLVR 5CC 18FR (CATHETERS) IMPLANT
CATH KIT ON Q 7.5IN SLV (PAIN MANAGEMENT) IMPLANT
CELLS DAT CNTRL 66122 CELL SVR (MISCELLANEOUS) IMPLANT
CHLORAPREP W/TINT 26ML (MISCELLANEOUS) ×4 IMPLANT
CLIP APPLIE 5 13 M/L LIGAMAX5 (MISCELLANEOUS) IMPLANT
CLIP APPLIE ROT 10 11.4 M/L (STAPLE) IMPLANT
CONT SPECI 4OZ STER CLIK (MISCELLANEOUS) IMPLANT
COUNTER NEEDLE 20 DBL MAG RED (NEEDLE) ×4 IMPLANT
COVER MAYO STAND STRL (DRAPES) ×8 IMPLANT
DECANTER SPIKE VIAL GLASS SM (MISCELLANEOUS) ×4 IMPLANT
DISSECTOR ROUND CHERRY 3/8 STR (MISCELLANEOUS) IMPLANT
DRAIN CHANNEL 19F RND (DRAIN) IMPLANT
DRAIN CHANNEL RND F F (WOUND CARE) IMPLANT
DRAPE CAMERA CLOSED 9X96 (DRAPES) IMPLANT
DRAPE LAPAROSCOPIC ABDOMINAL (DRAPES) ×4 IMPLANT
DRAPE LG THREE QUARTER DISP (DRAPES) ×4 IMPLANT
DRAPE UTILITY XL STRL (DRAPES) ×8 IMPLANT
DRAPE WARM FLUID 44X44 (DRAPE) ×4 IMPLANT
DRESSING TELFA ISLAND 4X8 (GAUZE/BANDAGES/DRESSINGS) IMPLANT
DRSG MEPILEX BORDER 4X8 (GAUZE/BANDAGES/DRESSINGS) ×4 IMPLANT
DRSG OPSITE POSTOP 4X10 (GAUZE/BANDAGES/DRESSINGS) ×4 IMPLANT
DRSG OPSITE POSTOP 4X6 (GAUZE/BANDAGES/DRESSINGS) ×4 IMPLANT
DRSG OPSITE POSTOP 4X8 (GAUZE/BANDAGES/DRESSINGS) IMPLANT
DRSG TEGADERM 2-3/8X2-3/4 SM (GAUZE/BANDAGES/DRESSINGS) ×4 IMPLANT
DRSG TEGADERM 4X4.75 (GAUZE/BANDAGES/DRESSINGS) IMPLANT
DRSG TELFA 3X8 NADH (GAUZE/BANDAGES/DRESSINGS) ×4 IMPLANT
DRSG TELFA 4X10 ISLAND STR (GAUZE/BANDAGES/DRESSINGS) IMPLANT
DRSG TELFA PLUS 4X6 ADH ISLAND (GAUZE/BANDAGES/DRESSINGS) IMPLANT
ELECT REM PT RETURN 9FT ADLT (ELECTROSURGICAL) ×4
ELECTRODE REM PT RTRN 9FT ADLT (ELECTROSURGICAL) ×2 IMPLANT
ENDOLOOP SUT PDS II  0 18 (SUTURE)
ENDOLOOP SUT PDS II 0 18 (SUTURE) IMPLANT
EVACUATOR SILICONE 100CC (DRAIN) IMPLANT
GAUZE SPONGE 2X2 8PLY STRL LF (GAUZE/BANDAGES/DRESSINGS) ×2 IMPLANT
GAUZE SPONGE 4X4 16PLY XRAY LF (GAUZE/BANDAGES/DRESSINGS) ×4 IMPLANT
GLOVE BIO SURGEON STRL SZ 6 (GLOVE) ×8 IMPLANT
GLOVE INDICATOR 6.5 STRL GRN (GLOVE) ×8 IMPLANT
GOWN SPEC L3 XXLG W/TWL (GOWN DISPOSABLE) ×8 IMPLANT
GOWN STRL REUS W/ TWL XL LVL3 (GOWN DISPOSABLE) IMPLANT
GOWN STRL REUS W/TWL 2XL LVL3 (GOWN DISPOSABLE) ×4 IMPLANT
GOWN STRL REUS W/TWL LRG LVL3 (GOWN DISPOSABLE) IMPLANT
GOWN STRL REUS W/TWL XL LVL3 (GOWN DISPOSABLE) ×16 IMPLANT
HEMOSTAT SURGICEL 4X8 (HEMOSTASIS) IMPLANT
KIT BASIN OR (CUSTOM PROCEDURE TRAY) ×4 IMPLANT
LEGGING LITHOTOMY PAIR STRL (DRAPES) ×4 IMPLANT
LUBRICANT JELLY K Y 4OZ (MISCELLANEOUS) IMPLANT
NEEDLE HYPO 22GX1.5 SAFETY (NEEDLE) ×4 IMPLANT
NS IRRIG 1000ML POUR BTL (IV SOLUTION) ×8 IMPLANT
PACK GENERAL/GYN (CUSTOM PROCEDURE TRAY) ×4 IMPLANT
PACK UNIVERSAL I (CUSTOM PROCEDURE TRAY) ×4 IMPLANT
PAIN PUMP ON-Q 400MLX5ML 5IN (MISCELLANEOUS) ×4 IMPLANT
PENCIL BUTTON HOLSTER BLD 10FT (ELECTRODE) ×8 IMPLANT
PLUG CATH AND CAP STER (CATHETERS) IMPLANT
RTRCTR WOUND ALEXIS 18CM MED (MISCELLANEOUS)
SCALPEL HARMONIC ACE (MISCELLANEOUS) IMPLANT
SCISSORS LAP 5X35 DISP (ENDOMECHANICALS) ×4 IMPLANT
SEALER TISSUE G2 CVD JAW 35 (ENDOMECHANICALS) IMPLANT
SEALER TISSUE G2 CVD JAW 45CM (ENDOMECHANICALS)
SEALER TISSUE G2 STRG ARTC 35C (ENDOMECHANICALS) IMPLANT
SET IRRIG TUBING LAPAROSCOPIC (IRRIGATION / IRRIGATOR) ×4 IMPLANT
SLEEVE SURGEON STRL (DRAPES) IMPLANT
SLEEVE XCEL OPT CAN 5 100 (ENDOMECHANICALS) ×8 IMPLANT
SOLUTION ANTI FOG 6CC (MISCELLANEOUS) ×4 IMPLANT
SPONGE DRAIN TRACH 4X4 STRL 2S (GAUZE/BANDAGES/DRESSINGS) ×4 IMPLANT
SPONGE GAUZE 2X2 STER 10/PKG (GAUZE/BANDAGES/DRESSINGS) ×2
SPONGE GAUZE 4X4 12PLY (GAUZE/BANDAGES/DRESSINGS) ×4 IMPLANT
SPONGE LAP 18X18 X RAY DECT (DISPOSABLE) ×8 IMPLANT
STAPLER GUN LINEAR PROX 60 (STAPLE) ×4 IMPLANT
STAPLER PROXIMATE 75MM BLUE (STAPLE) ×4 IMPLANT
STAPLER VISISTAT 35W (STAPLE) ×4 IMPLANT
STENT CONTOUR 6FRX24X.038 (STENTS) ×4 IMPLANT
SUCTION POOLE TIP (SUCTIONS) ×4 IMPLANT
SUT ETHILON 2 0 PS N (SUTURE) ×4 IMPLANT
SUT MNCRL AB 4-0 PS2 18 (SUTURE) ×4 IMPLANT
SUT PDS AB 0 CTX 60 (SUTURE) ×4 IMPLANT
SUT PDS AB 1 CTX 36 (SUTURE) IMPLANT
SUT PDS AB 1 TP1 96 (SUTURE) ×8 IMPLANT
SUT PROLENE 2 0 KS (SUTURE) IMPLANT
SUT SILK 2 0 (SUTURE) ×2
SUT SILK 2 0 SH CR/8 (SUTURE) ×4 IMPLANT
SUT SILK 2-0 18XBRD TIE 12 (SUTURE) IMPLANT
SUT SILK 2-0 30XBRD TIE 12 (SUTURE) ×2 IMPLANT
SUT SILK 3 0 (SUTURE)
SUT SILK 3 0 SH CR/8 (SUTURE) IMPLANT
SUT SILK 3-0 18XBRD TIE 12 (SUTURE) IMPLANT
SUT VIC AB 2-0 SH 18 (SUTURE) ×8 IMPLANT
SUT VIC AB 3-0 SH 18 (SUTURE) ×8 IMPLANT
SUT VIC AB 3-0 SH 27 (SUTURE) ×2
SUT VIC AB 3-0 SH 27X BRD (SUTURE) ×2 IMPLANT
SUT VIC AB 3-0 SH 8-18 (SUTURE) ×4 IMPLANT
SUT VICRYL 2 0 18  UND BR (SUTURE) ×2
SUT VICRYL 2 0 18 UND BR (SUTURE) ×2 IMPLANT
SUT VICRYL 3 0 BR 18  UND (SUTURE) ×4
SUT VICRYL 3 0 BR 18 UND (SUTURE) ×4 IMPLANT
SYR BULB IRRIGATION 50ML (SYRINGE) ×4 IMPLANT
SYR CONTROL 10ML LL (SYRINGE) ×4 IMPLANT
SYRINGE 10CC LL (SYRINGE) ×8 IMPLANT
SYS LAPSCP GELPORT 120MM (MISCELLANEOUS)
SYSTEM LAPSCP GELPORT 120MM (MISCELLANEOUS) IMPLANT
TOWEL OR 17X26 10 PK STRL BLUE (TOWEL DISPOSABLE) ×8 IMPLANT
TOWEL OR NON WOVEN STRL DISP B (DISPOSABLE) ×8 IMPLANT
TRAY FOLEY CATH 14FRSI W/METER (CATHETERS) ×4 IMPLANT
TRAY LAP CHOLE (CUSTOM PROCEDURE TRAY) ×4 IMPLANT
TROCAR BLADELESS OPT 5 100 (ENDOMECHANICALS) ×4 IMPLANT
TROCAR XCEL BLUNT TIP 100MML (ENDOMECHANICALS) IMPLANT
TROCAR XCEL NON-BLD 11X100MML (ENDOMECHANICALS) IMPLANT
TUBING CONNECTING 10 (TUBING) IMPLANT
TUBING CONNECTING 10' (TUBING)
TUBING FILTER THERMOFLATOR (ELECTROSURGICAL) ×4 IMPLANT
TUNNELER SHEATH ON-Q 16GX12 DP (PAIN MANAGEMENT) IMPLANT
YANKAUER SUCT BULB TIP 10FT TU (MISCELLANEOUS) ×8 IMPLANT
YANKAUER SUCT BULB TIP NO VENT (SUCTIONS) ×8 IMPLANT

## 2013-09-27 NOTE — Progress Notes (Signed)
Seen, discussed OR again.  Reviewed plan for ex lap, possible bowel resection, possible ostomy, possible feeding tube.  She is in agreement.

## 2013-09-27 NOTE — Anesthesia Postprocedure Evaluation (Signed)
  Anesthesia Post-op Note  Patient: Kelly [name]  Procedure(s) Performed: Procedure(s) (LRB): EXPLORATORY LAPAROTOMY LAPAROSCOPIC ILEOTRANSVERSE COLON BYPASS (N/A) GASTRECTOMY FEEDING TUBE (N/A) CYSTOSCOPY WITH TEMPORARY RIGHT URETERAL STENT PLACEMENT (Right) CYSTOSCOPY WITH RETROGRADE PYELOGRAM (Right)  Patient Location: PACU  Anesthesia Type: General  Level of Consciousness: awake and alert   Airway and Oxygen Therapy: Patient Spontanous Breathing  Post-op Pain: mild  Post-op Assessment: Post-op Vital signs reviewed, Patient's Cardiovascular Status Stable, Respiratory Function Stable, Patent Airway and No signs of Nausea or vomiting  Last Vitals:  Filed Vitals:   09/27/13 1531  BP:   Pulse: 99  Temp:   Resp: 18    Post-op Vital Signs: stable   Complications: No apparent anesthesia complications

## 2013-09-27 NOTE — OR Nursing (Signed)
Dr. Louis Meckel temporarily placed 56F open ended catheter in the right ureter it will be removed at the end of the case.

## 2013-09-27 NOTE — Progress Notes (Signed)
PARENTERAL NUTRITION CONSULT NOTE - Follow up  Pharmacy Consult for TNA Indication: bowel obstruction  Allergies  Allergen Reactions  . Adhesive [Tape] Other (See Comments)    Tears skin  . Amoxicillin     REACTION: RASH  . Clarithromycin     REACTION: GI UPSET  . Codeine Nausea Only and Palpitations   Patient Measurements: Height: _0  (165.1 cm) Weight: 141 lb 12.1 oz (64.3 kg) IBW/kg (Calculated) : 57 Adjusted Body Weight: 59 kg  Vital Signs: Temp: 98.3 F (36.8 C) (01/27 1004) Temp src: Oral (01/27 1004) BP: 135/56 mmHg (01/27 1004) Pulse Rate: 86 (01/27 1004) Intake/Output from previous day: 01/26 0701 - 01/27 0700 In: 2776.7 [I.V.:840; IV Piggyback:353.3; TPN:1583.3] Out: 1700 [Urine:1700] Intake/Output from this shift: Total I/O In: -  Out: 700 [Urine:700]  Labs:  Recent Labs  09/25/13 0935 09/26/13 0445 09/27/13 0600  WBC 6.7 7.8 9.3  HGB 10.5* 10.6* 10.8*  HCT 32.5* 33.6* 33.7*  PLT 225 238 240    Recent Labs  09/24/13 1145 09/25/13 0935 09/26/13 0445 09/27/13 0600  NA  --  139 135* 137  K  --  3.3* 3.3* 3.7  CL  --  103 102 102  CO2  --  _1 GLUCOSE  --  81 141* 128*  BUN  --  _2 CREATININE  --  0.40* 0.41* 0.36*  CALCIUM  --  7.8* 7.9* 8.0*  MG  --   --  1.9  --   PHOS  --   --  2.0* 3.0  PROT  --   --  5.3*  --   ALBUMIN  --   --  2.5*  --   AST  --   --  240*  --   ALT  --   --  133*  --   ALKPHOS  --   --  97  --   BILITOT  --   --  0.3  --   PREALBUMIN 11.3*  --  7.0*  --   TRIG  --   --  56  --    Estimated Creatinine Clearance: 58.9 ml/min (by C-G formula based on Cr of 0.36).    Recent Labs  09/26/13 1801 09/26/13 2333 09/27/13 0651  GLUCAP 127* 120* 109*    CBGs & Insulin requirements past 24 hours:  CBGs 144-109, 2 unit insulin used  Nutritional Goals:  - RD recs: Kcal: 1900-2000 ; Protein: 80-90 gm protein ; Fluid: 1.9L daily - PharmD Estimated needs: 70-85 g protein, 1800-2000 Kcal  -  Clinimix E5/20 _3  ml/hr  + IVF 20% at 10 ml/hr will provide: 84 g protein, 1958 Kcal per day   Current nutrition:  - Diet: NPO - TNA: Clinimix E5/20 at 40 mL/hr + 20% fat emulsion at 10 mL/hr - mIVF: NS @ 60 ml/hr, noted MD changed IVF to NS with 20 mEq KCl @ 60 mL/hr to start this AM   Assessment:  33 yoF with metastatic colon cancer currently receiving chemotherapy with FOLFIRI presented 1/23 with intractable N/V. CT abd/pelvis with distal small bowel obstruction, peritoneal carcinomatosis and stable liver metastases. Surgery on board, plan OR 1/27. Pt with a 30lbs weight loss over the past 6 months, pre-albumin 11.3, and anticipated prolonged ileus - TNA ordered to start 1/25. Patient with an implanted port on R chest with only 1 access per RN. MD notified to create peripheral access for IVF since TNA will be given via chest port.  Labs: Electrolytes: K+ 3.7, Na 137, CCa 9.1, Mag 1.9, Phos3.0 Renal Function: Scr 0.36, UOP decreased to 0.3 ml/kg/hr Hepatic Function: AST/ALT with new elevation, Alk phos now WNL Pre-Albumin: 11.3 (1/24) Triglycerides: 56 (1/26) CBGs: <150   Plan:  At 1800 tonight  Continue Clinimix E 5/20 @ 40 ml/hr  Will not increase TNA rate due to surgery-IV fluid use  IV fat emulsion 20% at 10 ml/hr daily  TNA to contain standard multivitamins and trace elements daily   Continue IVF at 69m/hr    Continue sensitive SSI q6h, q6h CBGs   TNA labs Monday/Thursdays  BMET in AM, check Mag and Phos also  Pharmacy will follow up daily  Thank you for the consult.  GMinda DittoPharmD Pager [phone]/27/2015, 10:16 AM

## 2013-09-27 NOTE — Op Note (Signed)
Preoperative diagnosis:  1. Metastatic colon cancer   Postoperative diagnosis:  1. As above   Procedure: 1. Cystoscopy 2. Retrograde pyelogram with interpretation 3. Insertion of right temporary ureteral stent  Surgeon: Ardis Hughs, MD  Anesthesia: General  Complications: None  Intraoperative findings: There was resistance just proximal to the right UVJ with passing a 0.38 sensor wire. Retrograde pyelogram was then performed showing a narrow segment approximately 5 cm long. It with some gentle tension I was able to pass a wire up beyond the narrowed segment and then advanced the 5 [place] open-ended ureteral Pollock over the wire into the right UPJ.   EBL: Minimal  Specimens: None  Indication: Kelly [name] is a 73 y.o. patient with  metastatic colon cancer. She's developed a small bowel/colonic obstruction. She has elected to proceed with a debulking procedure with general surgery. As part of the operation, Dr. Barry Dienes has requested intraoperative stent placement of the right ureter. The risk and benefits of this part of the procedure were discussed in detail to patient who agreed to proceed.  Description of procedure:  The patient was taken to the operating room and general anesthesia was induced.  The patient was placed in the dorsal lithotomy position, prepped and draped in the usual sterile fashion, and preoperative antibiotics were administered. A preoperative time-out was performed.   The 30 20th 2 French cystoscope was then gently passed to the patient's urethra and into the bladder. A 360 cystoscopic evaluation was then performed with no significant findings. The bladder mucosa appeared normal there were no bladder tumors or abnormal mucosa. The ureters were orthotopic bilaterally. I then placed a 0.38 sensor wire into the right ureteral orifice and met resistance as described above. I then performed a retrograde pyelogram with the above findings. I was able to  gently forced the wire through the narrowed segment and into the right kidney. I was then able to advance the 5 [place] open-ended ureteral catheter over the wire. The wire was then removed and the temporary indwelling stent was left behind. A 16 French Foley catheter was placed and the stent was secured to the catheter with the 0 silk tie. This point the case was then turned over to Dr. Barry Dienes.  Disposition: The stent will be removed at the end of the case. There is no additional urology followup required.  Ardis Hughs, M.D.

## 2013-09-27 NOTE — Progress Notes (Signed)
Patient ID: Kelly [name], female   DOB: February 23, 1941, 73 y.o.   MRN: [id number] Patient doing ok.  Ready for OR today.  All questions answered from patient and family.  They have no other concerns at this time.  Labs are all ok.  K has been replenished.  Cont TNA.  Kelly [name] E 8:34 AM 09/27/2013 Pager [phone]

## 2013-09-27 NOTE — Transfer of Care (Signed)
Immediate Anesthesia Transfer of Care Note  Patient: Kelly [name]  Procedure(s) Performed: Procedure(s): EXPLORATORY LAPAROTOMY LAPAROSCOPIC ILEOTRANSVERSE COLON BYPASS (N/A) GASTRECTOMY FEEDING TUBE (N/A) CYSTOSCOPY WITH TEMPORARY RIGHT URETERAL STENT PLACEMENT (Right) CYSTOSCOPY WITH RETROGRADE PYELOGRAM (Right)  Patient Location: PACU  Anesthesia Type:General  Level of Consciousness: sedated and patient cooperative  Airway & Oxygen Therapy: Patient Spontanous Breathing and Patient connected to face mask oxygen  Post-op Assessment: Report given to PACU RN and Post -op Vital signs reviewed and stable  Post vital signs: Reviewed and stable  Complications: No apparent anesthesia complications

## 2013-09-27 NOTE — Anesthesia Preprocedure Evaluation (Signed)
Anesthesia Evaluation  Patient identified by MRN, date of birth, ID band Patient awake    Reviewed: Allergy & Precautions, H&P , NPO status , Patient's Chart, lab work & pertinent test results  Airway Mallampati: II TM Distance: >3 FB Neck ROM: Full    Dental no notable dental hx.    Pulmonary asthma ,  breath sounds clear to auscultation  Pulmonary exam normal       Cardiovascular negative cardio ROS  - dysrhythmias Rhythm:Regular Rate:Normal     Neuro/Psych negative neurological ROS  negative psych ROS   GI/Hepatic GERD-  Medicated and Controlled,Elevated transaminases Stage IV colon CA   Endo/Other  negative endocrine ROS  Renal/GU negative Renal ROS  negative genitourinary   Musculoskeletal negative musculoskeletal ROS (+)   Abdominal   Peds negative pediatric ROS (+)  Hematology negative hematology ROS (+)   Anesthesia Other Findings   Reproductive/Obstetrics negative OB ROS                           Anesthesia Physical  Anesthesia Plan  ASA: II  Anesthesia Plan: General   Post-op Pain Management:    Induction: Intravenous  Airway Management Planned: Oral ETT  Additional Equipment:   Intra-op Plan:   Post-operative Plan: Extubation in OR  Informed Consent: I have reviewed the patients History and Physical, chart, labs and discussed the procedure including the risks, benefits and alternatives for the proposed anesthesia with the patient or authorized representative who has indicated his/her understanding and acceptance.   Dental advisory given  Plan Discussed with: CRNA  Anesthesia Plan Comments:         Anesthesia Quick Evaluation

## 2013-09-27 NOTE — Progress Notes (Signed)
[facility] PROGRESS NOTE  Kelly [name] IDP:[id number] DOB: 09/04/1940 DOA: 09/23/2013 PCP: Ria Bush, MD  Brief narrative: 73 y.o. very pleasant female with past medical history of colon ca with hepatic metastases, peritoneal carcinomatosis who presented to [facility] ED 09/24/2013 who 09/25/2013 with intractable nausea and vomiting. She was ultimately found to have distal small bowel obstruction, with increased small bowel dilatation, peritoneal carcinomatosis with mild increase in size of peritoneal soft tissue masses in the right pelvis at site of distal small bowel obstruction, stable liver metastases.   Assessment/Plan:   Principal Problem:  N&V (nausea and vomiting)  - secondary to small bowel obstruction secondary to metastatic colon cancer, peritoneal carcinomatosis  - continue supportive care with IV fluids, analgesia and antiemetics  - started TNA for low prealbumin - plan for surgery today Active Problems:  Severe protein calorie nutrition  - secondary to peritoneal carcinomatosis, colon ca, SBO   Code Status: full code  Family Communication: family at the bedside  Disposition Plan: home when stable   Consultants:  Oncology  Surgery  Procedures:  None  Antibiotics:  None   Leisa Lenz, MD  [facility] Pager [phone]  If 7PM-7AM, please contact night-coverage [email or web address] Password [email or web address] 09/27/2013, 3:28 PM   LOS: 4 days    HPI/Subjective: No acute overnight events.  Objective: Filed Vitals:   09/27/13 1445 09/27/13 1500 09/27/13 1515 09/27/13 1521  BP: 130/53 127/56  127/52  Pulse: 82 91  97  Temp:   97.7 F (36.5 C)   TempSrc:      Resp: 19 15  21   Height:      Weight:      SpO2: 100% 100%  99%    Intake/Output Summary (Last 24 hours) at 09/27/13 1528 Last data filed at 09/27/13 1352  Gross per 24 hour  Intake 4116.67 ml  Output   2300 ml  Net 1816.67 ml    Exam:   General:  Pt is alert, follows commands appropriately,  not in acute distress  Cardiovascular: Regular rate and rhythm, S1/S2, no murmurs, no rubs, no gallops  Respiratory: Clear to auscultation bilaterally, no wheezing, no crackles, no rhonchi  Abdomen: Softly distended, bowel sounds present, no guarding  Extremities: No edema, pulses DP and PT palpable bilaterally  Neuro: Grossly nonfocal  Data Reviewed: Basic Metabolic Panel:  Recent Labs Lab 09/23/13 1750 09/24/13 0620 09/25/13 0935 09/26/13 0445 09/27/13 0600  NA 136* 135* 139 135* 137  K 3.3* 3.2* 3.3* 3.3* 3.7  CL 96 99 103 102 102  CO2 26 24 20 24 26   GLUCOSE 114* 93 81 141* 128*  BUN 17 17 17 16 7   CREATININE 0.48* 0.46* 0.40* 0.41* 0.36*  CALCIUM 8.7 8.0* 7.8* 7.9* 8.0*  MG  --   --   --  1.9  --   PHOS  --   --   --  2.0* 3.0   Liver Function Tests:  Recent Labs Lab 09/21/13 1550 09/23/13 1750 09/26/13 0445  AST 20 19 240*  ALT 14 14 133*  ALKPHOS 119* 120* 97  BILITOT 0.9 0.5 0.3  PROT 7.3 6.9 5.3*  ALBUMIN 3.5 3.3* 2.5*    Recent Labs Lab 09/21/13 1550  LIPASE 12   No results found for this basename: AMMONIA,  in the last 168 hours CBC:  Recent Labs Lab 09/21/13 1550 09/23/13 1750 09/24/13 0620 09/25/13 0935 09/26/13 0445 09/27/13 0600  WBC 5.5 5.1 5.7 6.7 7.8 9.3  NEUTROABS 3.4 2.9  --   --  5.1  --   HGB 12.8 10.9* 10.3* 10.5* 10.6* 10.8*  HCT 39.4 33.9* 32.4* 32.5* 33.6* 33.7*  MCV 89.1 91.1 91.8 92.6 92.8 92.1  PLT PLATELET CLUMPS NOTED ON SMEAR, COUNT APPEARS INCREASED 148* 159 225 238 240   Cardiac Enzymes:  Recent Labs Lab 09/21/13 1550  TROPONINI <0.30   BNP: No components found with this basename: POCBNP,  CBG:  Recent Labs Lab 09/26/13 0615 09/26/13 1147 09/26/13 1801 09/26/13 2333 09/27/13 0651  GLUCAP 137* 144* 127* 120* 109*    Recent Results (from the past 240 hour(s))  SURGICAL PCR SCREEN     Status: None   Collection Time    09/27/13  8:36 AM      Result Value Range Status   MRSA, PCR NEGATIVE   NEGATIVE Final   Staphylococcus aureus NEGATIVE  NEGATIVE Final   Comment:            The Xpert SA Assay (FDA     approved for NASAL specimens     in patients over 54 years of age),     is one component of     a comprehensive surveillance     program.  Test performance has     been validated by [employer] for patients greater     than or equal to 41 year old.     It is not intended     to diagnose infection nor to     guide or monitor treatment.     Studies: Dg Chest Port 1 View  09/26/2013   CLINICAL DATA:  PICC line placement.  EXAM: PORTABLE CHEST - 1 VIEW  COMPARISON:  DG ABD ACUTE W/CHEST dated 09/21/2013; IR FLUORO GUIDE CV LINE*R* dated 06/14/2013  FINDINGS: Left-sided PICC line has been placed with the tip in the lower SVC. There is stable positioning of a right-sided Port-A-Cath. The lungs are clear. The heart size is normal.  IMPRESSION: PICC line tip is in the SVC.   Electronically Signed   By: Aletta Edouard M.D.   On: 09/26/2013 22:10   Dg Abd 2 Views  09/26/2013   CLINICAL DATA:  Small-bowel obstruction  EXAM: ABDOMEN - 2 VIEW  COMPARISON:  09/23/2013  FINDINGS: Minimal bowel gas is noted likely related to fluid-filled loops of small bowel. There is likely some persistent small bowel dilatation given the appearance. No free air is seen. No bony abnormality is noted.  IMPRESSION: Multiple fluid filled mildly dilated loops of small bowel.   Electronically Signed   By: Inez Catalina M.D.   On: 09/26/2013 12:39    Scheduled Meds: . fentaNYL      . fentaNYL      . HYDROmorphone PCA 0.3 mg/mL   Intravenous Q4H  . HYDROmorphone PCA 0.3 mg/mL      . [MAR HOLD] insulin aspart  0-9 Units Subcutaneous Q6H  . [facility] HOLD] mirtazapine  15 mg Oral QHS  . [MAR HOLD] pantoprazole (PROTONIX) IV  40 mg Intravenous Q24H  . [MAR HOLD] sodium chloride  10-40 mL Intracatheter Q12H  . [facility] HOLD] sodium chloride  10-40 mL Intracatheter Q12H   Continuous Infusions: . [MAR HOLD] .TPN  (CLINIMIX-E) Adult 40 mL/hr at 09/26/13 1713   And  . [facility] HOLD] fat emulsion 240 mL (09/26/13 1712)  . [MAR HOLD] .TPN (CLINIMIX-E) Adult 40 mL/hr (09/27/13 1113)   And  . [MAR HOLD] fat emulsion    . sodium chloride 0.9 % 1,000 mL  with potassium chloride 20 mEq infusion 60 mL/hr at 09/26/13 1100

## 2013-09-27 NOTE — Op Note (Addendum)
PRE-OPERATIVE DIAGNOSIS: malignant small bowel obstruction  POST-OPERATIVE DIAGNOSIS:  Same  PROCEDURE:  Procedure(s): exploratory laparotomy with ileocolostomy (bypass of RLQ mass) and stamm gastrostomy tube.    SURGEON:  Surgeon(s): Stark Klein, MD  ASSISTANT:   Jackolyn Confer, MD  ANESTHESIA:   general  DRAINS: Gastrostomy Tube   LOCAL MEDICATIONS USED:  NONE  SPECIMEN:  No Specimen  DISPOSITION OF SPECIMEN:  N/A  COUNTS:  YES  DICTATION: .Dragon Dictation  PLAN OF CARE: Admit to inpatient   PATIENT DISPOSITION:  PACU - hemodynamically stable.   EBL:  Minimal  FINDINGS:  Diffuse carcinomatosis.  Multiple loops of small bowel adherent to ileocecal mass.  Multiple implants on small bowel.  Upper small bowel tethered to the back of the abdomen.  Omental caking.  PROCEDURE:  Patient was identified in the holding area to operating room where she was placed on the operating room table. General anesthesia was induced. The patient underwent a ureteral stent placed on the right by Dr. Louis Meckel.  This will be dictated separately by him. The patient was then taken out of lithotomy position and placed supine. The abdomen was prepped and draped in sterile fashion. Timeouts performed according to the surgical safety checklist. At that point, we continued.  A midline incision was made in the upper abdomen above her previous scars. The cautery was used to divide the subcutaneous sutures. The abdomen was entered sharply with Metzenbaum scissors. The fascial incision was carried up to the mid abdomen above the umbilicus. The incision was also carried down inferiorly to the midway between the umbilicus and the pubis.  The omentum was indeed caked on the small bowel.  There are many peritoneal implants on the abdominal wall. There were numerous peritoneal implants on the surface of the small bowel.  The small bowel was attempted to be eviscerated. This was not possible as most of the small  bowel was adherent to the retroperitoneum. The bowel was run as best as possible. Some of the omental caking was removed from the surface of the small bowel in order to free up the small bowel. The ligament of Treitz was identified. The bowel was run as best as possible to get to the ileum. Most of the ileum was looped down and stuck in the right lower quadrant.   The ileum was able to be bypassed to the transverse colon.  Towels were placed around the abdominal wall. Bowel clamps were used above and below the area of anastomosis to minimize contamination, however there was some succus contaminating the peritoneal cavity.  This was performed with a side-to-side stapled anastomosis.  The bowel caliber and colon caliber were adequate for a staple closure of the defect. The staple line was oversewn with 3-0 Vicryl. The peritoneal cavity was irrigated copiously.The towels were removed and gloves and suction were changed. The tip was changed.  Attention was then directed to the stomach.  Two 2-0 pursestring sutures were placed on the anterior surface of the stomach. The Mic-Key G-tube was placed through the abdominal wall.  The tube was advanced through the stomach after a gastrotomy was made with the cautery. The 2 pursestrings were tied down. The stomach was pexed to the abdominal wall with 2-0 silk. The balloon was inflated and pulled up to the abdominal wall. The button of the G-tube was secured to the abdominal wall with 2-0 silk and 2-0 nylon.  The fascia was then closed with a running #1 PDS suture.  The skin was then  irrigated. The skin was closed with staples and wicks were placed. The wounds are clean, dry, and dressed with dry sterile dressings. The patient was awakened by anesthesia and taken to the PACU in stable condition. Needle, sponge, instrument counts were correct x2.

## 2013-09-28 ENCOUNTER — Encounter (HOSPITAL_COMMUNITY): Payer: Self-pay | Admitting: General Surgery

## 2013-09-28 ENCOUNTER — Ambulatory Visit: Payer: Medicare Other | Admitting: Oncology

## 2013-09-28 ENCOUNTER — Inpatient Hospital Stay: Payer: Medicare Other

## 2013-09-28 ENCOUNTER — Other Ambulatory Visit: Payer: Medicare Other

## 2013-09-28 LAB — GLUCOSE, CAPILLARY
GLUCOSE-CAPILLARY: 136 mg/dL — AB (ref 70–99)
Glucose-Capillary: 122 mg/dL — ABNORMAL HIGH (ref 70–99)
Glucose-Capillary: 126 mg/dL — ABNORMAL HIGH (ref 70–99)
Glucose-Capillary: 180 mg/dL — ABNORMAL HIGH (ref 70–99)

## 2013-09-28 LAB — BASIC METABOLIC PANEL
BUN: 6 mg/dL (ref 6–23)
CHLORIDE: 104 meq/L (ref 96–112)
CO2: 28 meq/L (ref 19–32)
CREATININE: 0.36 mg/dL — AB (ref 0.50–1.10)
Calcium: 8.2 mg/dL — ABNORMAL LOW (ref 8.4–10.5)
GFR calc non Af Amer: >90 mL/min (ref >90)
Glucose, Bld: 167 mg/dL — ABNORMAL HIGH (ref 70–99)
POTASSIUM: 4.4 meq/L (ref 3.7–5.3)
Sodium: 139 mEq/L (ref 137–147)

## 2013-09-28 LAB — PHOSPHORUS: PHOSPHORUS: 2.7 mg/dL (ref 2.3–4.6)

## 2013-09-28 LAB — MAGNESIUM: MAGNESIUM: 2 mg/dL (ref 1.5–2.5)

## 2013-09-28 MED ORDER — SODIUM CHLORIDE 0.9 % IJ SOLN: 9.0000 mL | INTRAMUSCULAR | Status: DC | PRN

## 2013-09-28 MED ORDER — SODIUM CHLORIDE 0.9 % IV SOLN
1.0000 g | INTRAVENOUS | Status: AC
  Administered 2013-09-28 – 2013-09-30 (×3): 1 g via INTRAVENOUS
  Filled 2013-09-28 (×3): qty 1

## 2013-09-28 MED ORDER — ENOXAPARIN SODIUM 40 MG/0.4ML ~~LOC~~ SOLN
40.0000 mg | SUBCUTANEOUS | Status: DC
  Administered 2013-09-28 – 2013-10-04 (×7): 40 mg via SUBCUTANEOUS
  Filled 2013-09-28 (×9): qty 0.4

## 2013-09-28 MED ORDER — DIPHENHYDRAMINE HCL 50 MG/ML IJ SOLN: 12.5000 mg | Freq: Three times a day (TID) | INTRAMUSCULAR | Status: DC | PRN

## 2013-09-28 MED ORDER — NALOXONE HCL 0.4 MG/ML IJ SOLN: 0.4000 mg | INTRAMUSCULAR | Status: DC | PRN

## 2013-09-28 MED ORDER — POTASSIUM CHLORIDE IN NACL 20-0.9 MEQ/L-% IV SOLN
INTRAVENOUS | Status: DC
  Administered 2013-09-28 – 2013-10-02 (×3): via INTRAVENOUS
  Filled 2013-09-28 (×7): qty 1000

## 2013-09-28 MED ORDER — DIPHENHYDRAMINE HCL 12.5 MG/5ML PO ELIX: 12.5000 mg | ORAL_SOLUTION | Freq: Four times a day (QID) | ORAL | Status: DC | PRN

## 2013-09-28 MED ORDER — DIPHENHYDRAMINE HCL 50 MG/ML IJ SOLN: 12.5000 mg | Freq: Four times a day (QID) | INTRAMUSCULAR | Status: DC | PRN

## 2013-09-28 MED ORDER — TRACE MINERALS CR-CU-F-FE-I-MN-MO-SE-ZN IV SOLN
INTRAVENOUS | Status: AC
  Administered 2013-09-28: 18:00:00 via INTRAVENOUS
  Filled 2013-09-28: qty 2000

## 2013-09-28 MED ORDER — FAT EMULSION 20 % IV EMUL
240.0000 mL | INTRAVENOUS | Status: AC
  Administered 2013-09-28: 240 mL via INTRAVENOUS
  Filled 2013-09-28: qty 250

## 2013-09-28 MED ORDER — POTASSIUM CHLORIDE 2 MEQ/ML IV SOLN
INTRAVENOUS | Status: DC
  Filled 2013-09-28: qty 1000

## 2013-09-28 MED ORDER — ONDANSETRON HCL 4 MG/2ML IJ SOLN: 4.0000 mg | Freq: Four times a day (QID) | INTRAMUSCULAR | Status: DC | PRN

## 2013-09-28 NOTE — Progress Notes (Signed)
Seen, agree with above. Insulin for hyperglycemia PCA IS/Ambulata D/C foley tomorrow Sips of clears OK.

## 2013-09-28 NOTE — Progress Notes (Signed)
Patient ID: Kelly [name], female   DOB: 1941/01/05, 73 y.o.   MRN: [id number] 1 Day Post-Op  Subjective: Patient feels well today.  Pain is well controlled.  No other c/o  Objective: Vital signs in last 24 hours: Temp:  [97.5 F (36.4 C)-98.5 F (36.9 C)] 97.5 F (36.4 C) (01/28 0946) Pulse Rate:  [82-99] 95 (01/28 0946) Resp:  [12-21] 20 (01/28 0946) BP: (101-132)/(47-71) 120/51 mmHg (01/28 0946) SpO2:  [96 %-100 %] 99 % (01/28 0946) Last BM Date: 09/27/13  Intake/Output from previous day: 01/27 0701 - 01/28 0700 In: 1590 [P.O.:115; I.V.:1475] Out: 2775 [Urine:2575; Drains:100; Blood:100] Intake/Output this shift: Total I/O In: 240 [P.O.:240] Out: -   PE: Abd: soft, appropriately tender, absent BS, g tube in place with bilious output.  Abdominal incision c/d/i with staples and honeycomb dressing. She has few wicks in place. Heart: regular GU: mild blood-tinged urine, likely secondary to stent  Lab Results:   Recent Labs  09/26/13 0445 09/27/13 0600  WBC 7.8 9.3  HGB 10.6* 10.8*  HCT 33.6* 33.7*  PLT 238 240   BMET  Recent Labs  09/27/13 0600 09/28/13 0550  NA 137 139  K 3.7 4.4  CL 102 104  CO2 26 28  GLUCOSE 128* 167*  BUN 7 6  CREATININE 0.36* 0.36*  CALCIUM 8.0* 8.2*   PT/INR No results found for this basename: LABPROT, INR,  in the last 72 hours CMP     Component Value Date/Time   NA 139 09/28/2013 0550   NA 140 09/14/2013 0928   K 4.4 09/28/2013 0550   K 3.1* 09/14/2013 0928   CL 104 09/28/2013 0550   CO2 28 09/28/2013 0550   CO2 27 09/14/2013 0928   GLUCOSE 167* 09/28/2013 0550   GLUCOSE 102 09/14/2013 0928   BUN 6 09/28/2013 0550   BUN 9.4 09/14/2013 0928   CREATININE 0.36* 09/28/2013 0550   CREATININE 0.6 09/14/2013 0928   CALCIUM 8.2* 09/28/2013 0550   CALCIUM 9.3 09/14/2013 0928   PROT 5.3* 09/26/2013 0445   PROT 7.0 09/14/2013 0928   ALBUMIN 2.5* 09/26/2013 0445   ALBUMIN 3.2* 09/14/2013 0928   AST 240* 09/26/2013 0445   AST 20 09/14/2013  0928   ALT 133* 09/26/2013 0445   ALT 10 09/14/2013 0928   ALKPHOS 97 09/26/2013 0445   ALKPHOS 106 09/14/2013 0928   BILITOT 0.3 09/26/2013 0445   BILITOT 0.52 09/14/2013 0928   GFRNONAA >90 09/28/2013 0550   GFRAA >90 09/28/2013 0550   Lipase     Component Value Date/Time   LIPASE 12 09/21/2013 1550       Studies/Results: Dg Chest Port 1 View  09/26/2013   CLINICAL DATA:  PICC line placement.  EXAM: PORTABLE CHEST - 1 VIEW  COMPARISON:  DG ABD ACUTE W/CHEST dated 09/21/2013; IR FLUORO GUIDE CV LINE*R* dated 06/14/2013  FINDINGS: Left-sided PICC line has been placed with the tip in the lower SVC. There is stable positioning of a right-sided Port-A-Cath. The lungs are clear. The heart size is normal.  IMPRESSION: PICC line tip is in the SVC.   Electronically Signed   By: Aletta Edouard M.D.   On: 09/26/2013 22:10   Dg Abd 2 Views  09/26/2013   CLINICAL DATA:  Small-bowel obstruction  EXAM: ABDOMEN - 2 VIEW  COMPARISON:  09/23/2013  FINDINGS: Minimal bowel gas is noted likely related to fluid-filled loops of small bowel. There is likely some persistent small bowel dilatation given the appearance. No  free air is seen. No bony abnormality is noted.  IMPRESSION: Multiple fluid filled mildly dilated loops of small bowel.   Electronically Signed   By: Inez Catalina M.D.   On: 09/26/2013 12:39   Dg C-arm 1-60 Min-no Report  09/27/2013   CLINICAL DATA: stent   C-ARM 1-60 MINUTES  Fluoroscopy was utilized by the requesting physician.  No radiographic  interpretation.     Anti-infectives: Anti-infectives   Start     Dose/Rate Route Frequency Ordered Stop   09/27/13 0800  [MAR Hold]  ertapenem (INVANZ) 1 g in sodium chloride 0.9 % 50 mL IVPB     (On MAR Hold since 09/27/13 1023)   1 g 100 mL/hr over 30 Minutes Intravenous On call to O.R. 09/27/13 0754 09/27/13 1202       Assessment/Plan  1. POD1, s/p ex lap with ileal-transverse colon anastomosis and g-tube placement 2. Stage 4 colon CA 3. Post  op ileus  Plan: 1. Day 2/4 Invanz due to small spillage in OR. 2. Will ask MD but suspect it's ok for patient to have some sips of liquids today.  Would not clamp NGT yet at she doesn't have any bowel function yet 3. Start lovenox today 4. Dc foley on POD2 5. Begin mobilization and pulm toilet 6. Discussed procedure with patient.  LOS: 5 days    Kelly [name] 09/28/2013, 10:49 AM Pager: [phone]

## 2013-09-28 NOTE — Progress Notes (Signed)
PARENTERAL NUTRITION CONSULT NOTE - Follow up  Pharmacy Consult for TNA Indication: bowel obstruction  Allergies  Allergen Reactions  . Adhesive [Tape] Other (See Comments)    Tears skin  . Amoxicillin     REACTION: RASH  . Clarithromycin     REACTION: GI UPSET  . Codeine Nausea Only and Palpitations   Patient Measurements: Height: 5\' 5"  (165.1 cm) Weight: 141 lb 12.1 oz (64.3 kg) IBW/kg (Calculated) : 57 Adjusted Body Weight: 59 kg  Vital Signs: Temp: 97.5 F (36.4 C) (01/28 0946) Temp src: Oral (01/28 0946) BP: 120/51 mmHg (01/28 0946) Pulse Rate: 95 (01/28 0946) Intake/Output from previous day: 01/27 0701 - 01/28 0700 In: 1590 [P.O.:115; I.V.:1475] Out: 2775 [Urine:2575; Drains:100; Blood:100] Intake/Output from this shift: Total I/O In: 240 [P.O.:240] Out: -   Labs:  Recent Labs  09/26/13 0445 09/27/13 0600  WBC 7.8 9.3  HGB 10.6* 10.8*  HCT 33.6* 33.7*  PLT 238 240    Recent Labs  09/26/13 0445 09/27/13 0600 09/28/13 0550  NA 135* 137 139  K 3.3* 3.7 4.4  CL 102 102 104  CO2 24 26 28   GLUCOSE 141* 128* 167*  BUN 16 7 6   CREATININE 0.41* 0.36* 0.36*  CALCIUM 7.9* 8.0* 8.2*  MG 1.9  --  2.0  PHOS 2.0* 3.0 2.7  PROT 5.3*  --   --   ALBUMIN 2.5*  --   --   AST 240*  --   --   ALT 133*  --   --   ALKPHOS 97  --   --   BILITOT 0.3  --   --   PREALBUMIN 7.0*  --   --   TRIG 56  --   --    Estimated Creatinine Clearance: 58.9 ml/min (by C-G formula based on Cr of 0.36).    Recent Labs  09/27/13 1805 09/28/13 0001 09/28/13 0556  GLUCAP 165* 180* 136*    CBGs & Insulin requirements past 24 hours:  CBGs 136-180, 5 units insulin used  Nutritional Goals:  - RD recs: Kcal: 1900-2000 ; Protein: 80-90 gm protein ; Fluid: 1.9L daily - PharmD Estimated needs: 70-85 g protein, 1800-2000 Kcal  - Clinimix E5/20 @70  ml/hr  + IVF 20% at 10 ml/hr will provide: 84 g protein, 1958 Kcal per day   Current nutrition:  - Diet: NPO except sips and  chips - TNA: Clinimix E5/20 at 40 mL/hr + 20% fat emulsion at 10 mL/hr - mIVF: NS  with 20 mEq KCl @ 60 mL/hr   Assessment:  4 yoF with metastatic colon cancer currently receiving chemotherapy with FOLFIRI presented 1/23 with intractable N/V. CT abd/pelvis with distal small bowel obstruction, peritoneal carcinomatosis and stable liver metastases. Surgery on board, plan OR 1/27. Pt with a 30lbs weight loss over the past 6 months, pre-albumin 11.3, and anticipated prolonged ileus - TNA ordered to start 1/25. Patient with an implanted port on R chest with only 1 access per RN. MD notified to create peripheral access for IVF since TNA will be given via chest port.   1/28: POD1, s/p ex lap with ileal-transverse colon anastomosis and g-tube placement performed 1/27  Labs: Electrolytes: K+ 4.4, Na 139, CCa 9.4, Mag 2.0, Phos 2.7 Renal Function: Scr 0.36, UOP decreased to 0.3 ml/kg/hr Hepatic Function: AST/ALT with new elevation, Alk phos now WNL Pre-Albumin: 11.3 (1/24) Triglycerides: 56 (1/26) CBGs: as above   TNA D4 TNA access: PICC  Plan:  At 1800 tonight  Increase  Clinimix E 5/20 to 60 ml/hr  IV fat emulsion 20% at 10 ml/hr daily  TNA to contain standard multivitamins and trace elements daily   Decrease IVF to 55ml/hr when TNA rate increases    F/u AM labs to assess need for KCl to remain in IVF  Continue sensitive SSI q6h, q6h CBGs   TNA labs Monday/Thursdays  Pharmacy will follow up daily  Thank you for the consult.  Johny Drilling, PharmD, BCPS Pager: [phone] Pharmacy: [phone] 09/28/2013 11:53 AM

## 2013-09-28 NOTE — Progress Notes (Addendum)
NUTRITION FOLLOW UP  Intervention:   TNA per pharmacy Diet advancement/initiation of TF per MD Will continue to monitor  Nutrition Dx:   Inadequate oral intake related to poor appetite and nause as evidenced by hx.    Goal:   Pt to meet >/= 90% of their estimated nutrition needs    Monitor:   TPN to meet >/= 90% of their estimated nutrition needs    Assessment:   1/25: 73 yo F with stage 4 colon cancer and malignant partial SBO, hepatic metastases, peritoneal carcinomatosis. Patient has been receiving chemo and has been followed closely by the [facility].  Patient meets criteria for Severe Malnutrition in the context of chronic illness AEB weight loss of 6% in the past month  and intake <75% for > 1 month.  1/28: -Pt s/p ex lap w/ileal-transverse and Gtube placement. Underwent procedure on 1/27. -Pharmacy noted pt with goal rate of Clinimix E5/20 @70  ml/hr + IVF 20% at 10 ml/hr to provide 1958 Kcal and 84 gram protein -Pt currently receiving Clinimix E5/20 at 40 mL/hr + 20% fat emulsion at 10 mL/hr and NS with 20 mEq KCl @ 60 mL/hr via peripheral access -Per pharmacy note, plan to increase Clinimix E 5/20 to 60 ml/hr. Will decrease IVF to 65ml/hr when TNA rate and monitor need for KCI -Hypoactive bowel sounds. Bowel function has not yet returned per MD -NGT removed on 1/27 -Phos, Mag and K all WNL. CBGs 136-180. -Will monitor need for tube feed via recently placed Gtube     Height: Ht Readings from Last 1 Encounters:  09/23/13 5\' 5"  (1.651 m)    Weight Status:   Wt Readings from Last 1 Encounters:  09/23/13 141 lb 12.1 oz (64.3 kg)    Re-estimated needs:  Kcal: 1900-2000  Protein: 80-90 gm protein  Fluid: 1.9L daily   Skin: WDL  Diet Order: NPO   Intake/Output Summary (Last 24 hours) at 09/28/13 1218 Last data filed at 09/28/13 0947  Gross per 24 hour  Intake   1830 ml  Output   2075 ml  Net   -245 ml    Last BM:  1/27   Labs:   Recent Labs Lab 09/26/13 0445 09/27/13 0600 09/28/13 0550  NA 135* 137 139  K 3.3* 3.7 4.4  CL 102 102 104  CO2 24 26 28   BUN 16 7 6   CREATININE 0.41* 0.36* 0.36*  CALCIUM 7.9* 8.0* 8.2*  MG 1.9  --  2.0  PHOS 2.0* 3.0 2.7  GLUCOSE 141* 128* 167*    CBG (last 3)   Recent Labs  09/28/13 0001 09/28/13 0556 09/28/13 1134  GLUCAP 180* 136* 126*    Scheduled Meds: . enoxaparin (LOVENOX) injection  40 mg Subcutaneous Q24H  . ertapenem (INVANZ) IV  1 g Intravenous Q24H  . HYDROmorphone PCA 0.3 mg/mL   Intravenous Q4H  . insulin aspart  0-9 Units Subcutaneous Q6H  . pantoprazole (PROTONIX) IV  40 mg Intravenous Q24H  . sodium chloride  10-40 mL Intracatheter Q12H  . sodium chloride  10-40 mL Intracatheter Q12H    Continuous Infusions: . 0.9 % NaCl with KCl 20 mEq / L    . Marland KitchenTPN (CLINIMIX-E) Adult 40 mL/hr at 09/27/13 1709   And  . fat emulsion 240 mL (09/27/13 1709)  . Marland KitchenTPN (CLINIMIX-E) Adult     And  . fat emulsion    . sodium chloride 0.9 % 1,000 mL with potassium chloride 20 mEq infusion 60  mL/hr at 09/28/13 [place] RD LDN Clinical Dietitian [phone]

## 2013-09-28 NOTE — Care Management Note (Signed)
   CARE MANAGEMENT NOTE 09/28/2013  Patient:  Kelly [name], Kelly [name]   Account Number:  [id number]  Date Initiated:  09/28/2013  Documentation initiated by:  Talley Kreiser  Subjective/Objective Assessment:   73 yo female admitted with s/p ex lap with ileal-transverse colon anastomosis and g-tube placement     Action/Plan:   Home when stable   Anticipated DC Date:     Anticipated DC Plan:  [place]  CM consult      Choice offered to / List presented to:  NA   DME arranged  NA      DME agency  NA     [place] arranged  NA      [place] agency  NA   Status of service:  In process, will continue to follow Medicare Important Message given?   (If response is "NO", the following Medicare IM given date fields will be blank) Date Medicare IM given:   Date Additional Medicare IM given:    Discharge Disposition:    Per UR Regulation:  Reviewed for med. necessity/level of care/duration of stay  If discussed at [place] of Stay Meetings, dates discussed:    Comments:  09/28/13 [place] Chart reviewed for utilization of services, continued LOS. Cm to continue to follow for possible dc needs. Pt currently NPO, NG tube, PCA,  IVF@ 60.

## 2013-09-29 LAB — COMPREHENSIVE METABOLIC PANEL
ALBUMIN: 1.9 g/dL — AB (ref 3.5–5.2)
ALK PHOS: 109 U/L (ref 39–117)
ALT: 58 U/L — AB (ref 0–35)
AST: 27 U/L (ref 0–37)
BILIRUBIN TOTAL: 0.2 mg/dL — AB (ref 0.3–1.2)
BUN: 12 mg/dL (ref 6–23)
CO2: 28 meq/L (ref 19–32)
CREATININE: 0.78 mg/dL (ref 0.50–1.10)
Calcium: 8.1 mg/dL — ABNORMAL LOW (ref 8.4–10.5)
Chloride: 99 mEq/L (ref 96–112)
GFR calc Af Amer: >90 mL/min (ref >90)
GFR, EST NON AFRICAN AMERICAN: 83 mL/min — AB (ref >90)
Glucose, Bld: 131 mg/dL — ABNORMAL HIGH (ref 70–99)
POTASSIUM: 3.8 meq/L (ref 3.7–5.3)
SODIUM: 134 meq/L — AB (ref 137–147)
Total Protein: 5 g/dL — ABNORMAL LOW (ref 6.0–8.3)

## 2013-09-29 LAB — GLUCOSE, CAPILLARY
GLUCOSE-CAPILLARY: 113 mg/dL — AB (ref 70–99)
GLUCOSE-CAPILLARY: 128 mg/dL — AB (ref 70–99)
Glucose-Capillary: 132 mg/dL — ABNORMAL HIGH (ref 70–99)
Glucose-Capillary: 120 mg/dL — ABNORMAL HIGH (ref 70–99)

## 2013-09-29 LAB — PHOSPHORUS: PHOSPHORUS: 3.1 mg/dL (ref 2.3–4.6)

## 2013-09-29 LAB — MAGNESIUM: Magnesium: 1.8 mg/dL (ref 1.5–2.5)

## 2013-09-29 MED ORDER — ACETAMINOPHEN 10 MG/ML IV SOLN
1000.0000 mg | Freq: Four times a day (QID) | INTRAVENOUS | Status: AC
  Administered 2013-09-29 – 2013-09-30 (×4): 1000 mg via INTRAVENOUS
  Filled 2013-09-29 (×4): qty 100

## 2013-09-29 MED ORDER — KETOROLAC TROMETHAMINE 15 MG/ML IJ SOLN
15.0000 mg | Freq: Four times a day (QID) | INTRAMUSCULAR | Status: AC
  Administered 2013-09-29 – 2013-10-01 (×8): 15 mg via INTRAVENOUS
  Filled 2013-09-29 (×8): qty 1

## 2013-09-29 MED ORDER — FAT EMULSION 20 % IV EMUL
240.0000 mL | INTRAVENOUS | Status: AC
  Administered 2013-09-29: 240 mL via INTRAVENOUS
  Filled 2013-09-29: qty 250

## 2013-09-29 MED ORDER — TRACE MINERALS CR-CU-F-FE-I-MN-MO-SE-ZN IV SOLN
INTRAVENOUS | Status: AC
  Administered 2013-09-29: 18:00:00 via INTRAVENOUS
  Filled 2013-09-29: qty 2000

## 2013-09-29 NOTE — Progress Notes (Signed)
Patient ID: Kelly [name], female   DOB: [birth year], 73 y.o.   MRN: [id number] 2 Days Post-Op  Subjective: Pt got startled last night and jumped and has had some increasing pain since then.  No flatus yet.  Otherwise doing ok.  Objective: Vital signs in last 24 hours: Temp:  [97.8 F (36.6 C)-99.2 F (37.3 C)] 99.2 F (37.3 C) (01/29 0513) Pulse Rate:  [87-100] 100 (01/29 0513) Resp:  [15-22] 16 (01/29 0513) BP: (107-137)/(53-60) 107/58 mmHg (01/29 0513) SpO2:  [44 %-100 %] 100 % (01/29 0513) Last BM Date: 09/27/13  Intake/Output from previous day: 01/28 0701 - 01/29 0700 In: 370 [P.O.:360; I.V.:10] Out: 1425 [Urine:975; Drains:450] Intake/Output this shift:    PE: Heart: regular Lungs: CTAB Abd: soft, appropriately tender, maybe a few distant BS, Nd, incision c/d/i with wicks and dressing in place.  g-tube in place with small amount of bilious output today  Lab Results:   Recent Labs  09/27/13 0600  WBC 9.3  HGB 10.8*  HCT 33.7*  PLT 240   BMET  Recent Labs  09/28/13 0550 09/29/13 0550  NA 139 134*  K 4.4 3.8  CL 104 99  CO2 28 28  GLUCOSE 167* 131*  BUN 6 12  CREATININE 0.36* 0.78  CALCIUM 8.2* 8.1*   PT/INR No results found for this basename: LABPROT, INR,  in the last 72 hours CMP     Component Value Date/Time   NA 134* 09/29/2013 0550   NA 140 09/14/2013 0928   K 3.8 09/29/2013 0550   K 3.1* 09/14/2013 0928   CL 99 09/29/2013 0550   CO2 28 09/29/2013 0550   CO2 27 09/14/2013 0928   GLUCOSE 131* 09/29/2013 0550   GLUCOSE 102 09/14/2013 0928   BUN 12 09/29/2013 0550   BUN 9.4 09/14/2013 0928   CREATININE 0.78 09/29/2013 0550   CREATININE 0.6 09/14/2013 0928   CALCIUM 8.1* 09/29/2013 0550   CALCIUM 9.3 09/14/2013 0928   PROT 5.0* 09/29/2013 0550   PROT 7.0 09/14/2013 0928   ALBUMIN 1.9* 09/29/2013 0550   ALBUMIN 3.2* 09/14/2013 0928   AST 27 09/29/2013 0550   AST 20 09/14/2013 0928   ALT 58* 09/29/2013 0550   ALT 10 09/14/2013 0928   ALKPHOS 109  09/29/2013 0550   ALKPHOS 106 09/14/2013 0928   BILITOT 0.2* 09/29/2013 0550   BILITOT 0.52 09/14/2013 0928   GFRNONAA 83* 09/29/2013 0550   GFRAA >90 09/29/2013 0550   Lipase     Component Value Date/Time   LIPASE 12 09/21/2013 1550       Studies/Results: Dg C-arm 1-60 Min-no Report  09/27/2013   CLINICAL DATA: stent   C-ARM 1-60 MINUTES  Fluoroscopy was utilized by the requesting physician.  No radiographic  interpretation.     Anti-infectives: Anti-infectives   Start     Dose/Rate Route Frequency Ordered Stop   09/28/13 1200  ertapenem (INVANZ) 1 g in sodium chloride 0.9 % 50 mL IVPB     1 g 100 mL/hr over 30 Minutes Intravenous Every 24 hours 09/28/13 1103 10/01/13 1159   09/27/13 0800  [MAR Hold]  ertapenem (INVANZ) 1 g in sodium chloride 0.9 % 50 mL IVPB     (On MAR Hold since 09/27/13 1023)   1 g 100 mL/hr over 30 Minutes Intravenous On call to O.R. 09/27/13 0754 09/27/13 1202       Assessment/Plan  1. POD 2, s/p ex lap with ileal-colonic bypass and placement of g-tube 2. Stage  4 colon cancer 3. Post op ileus 4. PCM/TNA  Plan: 1. Will add toradol and tylenol for additional pain control for 24-48 hrs. 2. Encourage mobilization and pulm toilet 3. Cont g-tube to gravity for now and await bowel function 4. Follow labs while on toradol.  5. Cont TNA while NPO  LOS: 6 days    Sheneika Walstad E 09/29/2013, 10:37 AM Pager: [phone]

## 2013-09-29 NOTE — Progress Notes (Addendum)
PARENTERAL NUTRITION CONSULT NOTE - Follow up  Pharmacy Consult for TNA Indication: bowel obstruction  Allergies  Allergen Reactions  . Adhesive [Tape] Other (See Comments)    Tears skin  . Amoxicillin     REACTION: RASH  . Clarithromycin     REACTION: GI UPSET  . Codeine Nausea Only and Palpitations   Patient Measurements: Height: 5\' 5"  (165.1 cm) Weight: 141 lb 12.1 oz (64.3 kg) IBW/kg (Calculated) : 57 Adjusted Body Weight: 59 kg  Vital Signs: Temp: 99.2 F (37.3 C) (01/29 0513) Temp src: Oral (01/29 0513) BP: 107/58 mmHg (01/29 0513) Pulse Rate: 100 (01/29 0513) Intake/Output from previous day: 01/28 0701 - 01/29 0700 In: 370 [P.O.:360; I.V.:10] Out: 1425 [Urine:975; Drains:450] Intake/Output from this shift:    Labs:  Recent Labs  09/27/13 0600  WBC 9.3  HGB 10.8*  HCT 33.7*  PLT 240    Recent Labs  09/27/13 0600 09/28/13 0550 09/29/13 0550  NA 137 139 134*  K 3.7 4.4 3.8  CL 102 104 99  CO2 26 28 28   GLUCOSE 128* 167* 131*  BUN 7 6 12   CREATININE 0.36* 0.36* 0.78  CALCIUM 8.0* 8.2* 8.1*  MG  --  2.0 1.8  PHOS 3.0 2.7 3.1  PROT  --   --  5.0*  ALBUMIN  --   --  1.9*  AST  --   --  27  ALT  --   --  58*  ALKPHOS  --   --  109  BILITOT  --   --  0.2*   Estimated Creatinine Clearance: 58.9 ml/min (by C-G formula based on Cr of 0.78).    Recent Labs  09/28/13 1814 09/29/13 09/29/13 0609  GLUCAP 122* 128* 113*    CBGs & Insulin requirements past 24 hours:  CBGs 131-167, 6 units insulin used  Nutritional Goals:  - RD recs: Kcal: 1900-2000 ; Protein: 80-90 gm protein ; Fluid: 1.9L daily - PharmD Estimated needs: 70-85 g protein, 1800-2000 Kcal  - Clinimix E5/20 @70  ml/hr  + IVF 20% at 10 ml/hr will provide: 84 g protein, 1958 Kcal per day   Current nutrition:  - Diet: NPO except sips and chips - TNA: Clinimix E5/20 at 40 mL/hr + 20% fat emulsion at 10 mL/hr - mIVF: NS  with 20 mEq KCl @ 60 mL/hr   Assessment:  73 yoF with  metastatic colon cancer currently receiving chemotherapy with FOLFIRI presented 1/23 with intractable N/V. CT abd/pelvis with distal small bowel obstruction, peritoneal carcinomatosis and stable liver metastases. Surgery on board, plan OR 1/27. Pt with a 30lbs weight loss over the past 6 months, pre-albumin 11.3, and anticipated prolonged ileus - TNA ordered to start 1/25. Patient with an implanted port on R chest with only 1 access per RN. MD notified to create peripheral access for IVF since TNA will be given via chest port.   1/29: POD2, s/p ex lap with ileal-transverse colon anastomosis and g-tube placement performed 1/27  Labs: Electrolytes: K+ 3.8, Na 134, CCa 9.78, Mag 1.8, Phos 3.1 Renal Function: Scr 0.78, UOP ~ 0.6 ml/kg/hr Hepatic Function: AST/ALT trending down towards normal, Alk phos now WNL Pre-Albumin: 11.3 (1/24) Triglycerides: 56 (1/26) CBGs: as above   TNA D5 TNA access: PICC  Plan:  At 1800 tonight  Increase Clinimix E 5/20 to 70 ml/hr (goal rate)  IV fat emulsion 20% at 10 ml/hr daily  TNA to contain standard multivitamins and trace elements daily   Will keep IVF  at 66ml/hr for now (K+ down to 3.8 so will keep KCl in fluids)  F/u AM labs to assess need for KCl to remain in IVF  Continue sensitive SSI q6h, q6h CBGs   TNA labs Monday/Thursdays  Pharmacy will follow up daily  Thank you for the consult.  Montel Clock, PharmD  Pharmacy: [phone] 09/29/2013 9:20 AM

## 2013-09-29 NOTE — Progress Notes (Signed)
Seen, agree with a bove.  Add tylenol and toradol Await return of bowel function.

## 2013-09-30 LAB — TYPE AND SCREEN
ABO/RH(D): O POS
Antibody Screen: NEGATIVE
Unit division: 0
Unit division: 0

## 2013-09-30 LAB — GLUCOSE, CAPILLARY
GLUCOSE-CAPILLARY: 161 mg/dL — AB (ref 70–99)
Glucose-Capillary: 128 mg/dL — ABNORMAL HIGH (ref 70–99)
Glucose-Capillary: 139 mg/dL — ABNORMAL HIGH (ref 70–99)
Glucose-Capillary: 144 mg/dL — ABNORMAL HIGH (ref 70–99)

## 2013-09-30 MED ORDER — SIMETHICONE 40 MG/0.6ML PO SUSP
40.0000 mg | Freq: Four times a day (QID) | ORAL | Status: DC | PRN
  Filled 2013-09-30: qty 0.6

## 2013-09-30 MED ORDER — LIDOCAINE-PRILOCAINE 2.5-2.5 % EX CREA
TOPICAL_CREAM | CUTANEOUS | Status: DC | PRN
  Filled 2013-09-30: qty 5

## 2013-09-30 MED ORDER — TRACE MINERALS CR-CU-F-FE-I-MN-MO-SE-ZN IV SOLN
INTRAVENOUS | Status: AC
  Administered 2013-09-30: 17:00:00 via INTRAVENOUS
  Filled 2013-09-30: qty 2000

## 2013-09-30 MED ORDER — LORATADINE 10 MG PO TABS
10.0000 mg | ORAL_TABLET | Freq: Every day | ORAL | Status: DC
  Administered 2013-10-01 – 2013-10-06 (×6): 10 mg via ORAL
  Filled 2013-09-30 (×7): qty 1

## 2013-09-30 MED ORDER — FAT EMULSION 20 % IV EMUL
240.0000 mL | INTRAVENOUS | Status: AC
  Administered 2013-09-30: 240 mL via INTRAVENOUS
  Filled 2013-09-30: qty 250

## 2013-09-30 NOTE — Progress Notes (Signed)
Patient ID: Kelly [name], female   DOB: 1941-08-22, 73 y.o.   MRN: [id number] 3 Days Post-Op  Subjective: Pt's pain is better with tylenol and toradol.  Hasn't had to use her PCA since sometime yesterday.  Walked to the wall outside her room yesterday, otherwise just to the bathroom and back.  No flatus.  Feels a bit more bloated today  Objective: Vital signs in last 24 hours: Temp:  [97.8 F (36.6 C)-98.6 F (37 C)] 97.8 F (36.6 C) (01/30 0544) Pulse Rate:  [85-99] 94 (01/30 0544) Resp:  [16-26] 20 (01/30 0544) BP: (109-135)/(50-67) 135/65 mmHg (01/30 0544) SpO2:  [98 %-99 %] 99 % (01/30 0544) Last BM Date: 09/27/13  Intake/Output from previous day: 01/29 0701 - 01/30 0700 In: 1833 [P.O.:480; I.V.:641; TPN:712] Out: 2600 [Urine:2225; Drains:375] Intake/Output this shift:    PE: Abd: soft, seems a touch more bloated today, but still with less g-tube output than the day before.  Bilious in nature.  Absent BS, still tender, but seems a little less.  Incision intact with staples and wicks.  Lab Results:  No results found for this basename: WBC, HGB, HCT, PLT,  in the last 72 hours BMET  Recent Labs  09/28/13 0550 09/29/13 0550  NA 139 134*  K 4.4 3.8  CL 104 99  CO2 28 28  GLUCOSE 167* 131*  BUN 6 12  CREATININE 0.36* 0.78  CALCIUM 8.2* 8.1*   PT/INR No results found for this basename: LABPROT, INR,  in the last 72 hours CMP     Component Value Date/Time   NA 134* 09/29/2013 0550   NA 140 09/14/2013 0928   K 3.8 09/29/2013 0550   K 3.1* 09/14/2013 0928   CL 99 09/29/2013 0550   CO2 28 09/29/2013 0550   CO2 27 09/14/2013 0928   GLUCOSE 131* 09/29/2013 0550   GLUCOSE 102 09/14/2013 0928   BUN 12 09/29/2013 0550   BUN 9.4 09/14/2013 0928   CREATININE 0.78 09/29/2013 0550   CREATININE 0.6 09/14/2013 0928   CALCIUM 8.1* 09/29/2013 0550   CALCIUM 9.3 09/14/2013 0928   PROT 5.0* 09/29/2013 0550   PROT 7.0 09/14/2013 0928   ALBUMIN 1.9* 09/29/2013 0550   ALBUMIN 3.2*  09/14/2013 0928   AST 27 09/29/2013 0550   AST 20 09/14/2013 0928   ALT 58* 09/29/2013 0550   ALT 10 09/14/2013 0928   ALKPHOS 109 09/29/2013 0550   ALKPHOS 106 09/14/2013 0928   BILITOT 0.2* 09/29/2013 0550   BILITOT 0.52 09/14/2013 0928   GFRNONAA 83* 09/29/2013 0550   GFRAA >90 09/29/2013 0550   Lipase     Component Value Date/Time   LIPASE 12 09/21/2013 1550       Studies/Results: No results found.  Anti-infectives: Anti-infectives   Start     Dose/Rate Route Frequency Ordered Stop   09/28/13 1200  ertapenem (INVANZ) 1 g in sodium chloride 0.9 % 50 mL IVPB     1 g 100 mL/hr over 30 Minutes Intravenous Every 24 hours 09/28/13 1103 10/01/13 1159   09/27/13 0800  [MAR Hold]  ertapenem (INVANZ) 1 g in sodium chloride 0.9 % 50 mL IVPB     (On MAR Hold since 09/27/13 1023)   1 g 100 mL/hr over 30 Minutes Intravenous On call to O.R. 09/27/13 0754 09/27/13 1202       Assessment/Plan  1. POD 3, s/p ex lap with ileal-colonic bypass and placement of g-tube 2. Stage 4 colon cancer  3. Post  op ileus  4. PCM/TNA  Plan: 1. Cont TNA.  Bowel function has not returned yet so will keep g-tube to gravity for now. 2. She will likely need her dressing and wicks removed tomorrow or Sunday. 3. Cont mobilization and pulm toilet. 4. Follow Cr on toradol.  LOS: 7 days    Kelly [name] 09/30/2013, 8:49 AM Pager: [phone]

## 2013-09-30 NOTE — Progress Notes (Signed)
Agree with assessment/plan   Adrian Saran, PharmD, BCPS Pager [phone] 09/30/2013 8:38 AM

## 2013-09-30 NOTE — Progress Notes (Signed)
PARENTERAL NUTRITION CONSULT NOTE - Follow up  Pharmacy Consult for TNA Indication: bowel obstruction  Allergies  Allergen Reactions  . Adhesive [Tape] Other (See Comments)    Tears skin  . Amoxicillin     REACTION: RASH  . Clarithromycin     REACTION: GI UPSET  . Codeine Nausea Only and Palpitations   Patient Measurements: Height: 5\' 5"  (165.1 cm) Weight: 141 lb 12.1 oz (64.3 kg) IBW/kg (Calculated) : 57 Adjusted Body Weight: 59 kg  Vital Signs: Temp: 97.8 F (36.6 C) (01/30 0544) Temp src: Oral (01/30 0544) BP: 135/65 mmHg (01/30 0544) Pulse Rate: 94 (01/30 0544) Intake/Output from previous day: 01/29 0701 - 01/30 0700 In: 1950 [P.O.:480; I.V.:641; TPN:712] Out: 2600 [Urine:2225; Drains:375] Intake/Output from this shift:    Labs: No results found for this basename: WBC, HGB, HCT, PLT, APTT, INR,  in the last 72 hours  Recent Labs  09/28/13 0550 09/29/13 0550  NA 139 134*  K 4.4 3.8  CL 104 99  CO2 28 28  GLUCOSE 167* 131*  BUN 6 12  CREATININE 0.36* 0.78  CALCIUM 8.2* 8.1*  MG 2.0 1.8  PHOS 2.7 3.1  PROT  --  5.0*  ALBUMIN  --  1.9*  AST  --  27  ALT  --  58*  ALKPHOS  --  109  BILITOT  --  0.2*   Estimated Creatinine Clearance: 58.9 ml/min (by C-G formula based on Cr of 0.78).    Recent Labs  09/29/13 1824 09/30/13 0009 09/30/13 0541  GLUCAP 120* 144* 139*    CBGs & Insulin requirements past 24 hours:  CBGs 113-144, 3 units insulin used  Nutritional Goals:  - RD recs: Kcal: 1900-2000 ; Protein: 80-90 gm protein ; Fluid: 1.9L daily - PharmD Estimated needs: 70-85 g protein, 1800-2000 Kcal  - Clinimix E5/20 @70  ml/hr  + IVF 20% at 10 ml/hr will provide: 84 g protein, 1958 Kcal per day   Current nutrition:  - Diet: NPO except sips and chips - TNA: Clinimix E5/20 at 40 mL/hr + 20% fat emulsion at 10 mL/hr - mIVF: NS  with 20 mEq KCl @ 40 mL/hr   Assessment:  25 yoF with metastatic colon cancer currently receiving chemotherapy with  FOLFIRI presented 1/23 with intractable N/V. CT abd/pelvis with distal small bowel obstruction, peritoneal carcinomatosis and stable liver metastases. Surgery on board, plan OR 1/27. Pt with a 30lbs weight loss over the past 6 months, pre-albumin 11.3, and anticipated prolonged ileus - TNA ordered to start 1/25. Patient with an implanted port on R chest with only 1 access per RN. MD notified to create peripheral access for IVF since TNA will be given via chest port.   1/30: POD3, s/p ex lap with ileal-transverse colon anastomosis and g-tube placement performed 1/27  Labs: Electrolytes: K+ 3.8, Na 134, CCa 9.78, Mag 1.8, Phos 3.1 Renal Function: Scr 0.78, UOP ~ 1.4 ml/kg/hr Hepatic Function: AST/ALT trending down towards normal, Alk phos now WNL Pre-Albumin: 11.3 (1/24) Triglycerides: 56 (1/26) CBGs: as above   TNA D6 TNA access: PICC  Plan:  At 1800 tonight  Continue Clinimix E 5/20 at 70 ml/hr (goal rate)  IV fat emulsion 20% at 10 ml/hr daily  TNA to contain standard multivitamins and trace elements daily   Will keep IVF at 103ml/hr for now (K+ down to 3.8 so will keep KCl in fluids)  F/u AM labs to assess need for KCl to remain in IVF  Continue sensitive SSI q6h,  q6h CBGs   TNA labs Monday/Thursdays  Pharmacy will follow up daily  Thank you for the consult.  Silas [place], PharmD Candidate  Pharmacy: [phone] 09/30/2013 8:26 AM

## 2013-09-30 NOTE — Progress Notes (Signed)
Seen, agree with above.  Await return of bowel function.

## 2013-09-30 NOTE — Progress Notes (Signed)
C/o increased abd pain. Abd more distended than this am. Encouraged to try to be OOB to ambulate. Pt feels she can not at this time. Did ambulate 40 feet in hall this am. Encouraged to move/roll side to side, sit up. Did get OOB to [facility] to void. Upon returning to bed, pt stated she did feel better. Occassional loud bowel sound audibly heard when getting OOB.Harlene Ramus

## 2013-10-01 LAB — COMPREHENSIVE METABOLIC PANEL
ALBUMIN: 1.7 g/dL — AB (ref 3.5–5.2)
ALK PHOS: 105 U/L (ref 39–117)
ALT: 30 U/L (ref 0–35)
AST: 24 U/L (ref 0–37)
BUN: 17 mg/dL (ref 6–23)
CHLORIDE: 103 meq/L (ref 96–112)
CO2: 26 mEq/L (ref 19–32)
Calcium: 7.9 mg/dL — ABNORMAL LOW (ref 8.4–10.5)
Creatinine, Ser: 0.63 mg/dL (ref 0.50–1.10)
GFR calc Af Amer: >90 mL/min (ref >90)
GFR calc non Af Amer: 89 mL/min — ABNORMAL LOW (ref >90)
Glucose, Bld: 123 mg/dL — ABNORMAL HIGH (ref 70–99)
POTASSIUM: 4.2 meq/L (ref 3.7–5.3)
SODIUM: 137 meq/L (ref 137–147)
TOTAL PROTEIN: 5 g/dL — AB (ref 6.0–8.3)
Total Bilirubin: <0.2 mg/dL — ABNORMAL LOW (ref 0.3–1.2)

## 2013-10-01 LAB — GLUCOSE, CAPILLARY
GLUCOSE-CAPILLARY: 129 mg/dL — AB (ref 70–99)
Glucose-Capillary: 108 mg/dL — ABNORMAL HIGH (ref 70–99)
Glucose-Capillary: 132 mg/dL — ABNORMAL HIGH (ref 70–99)
Glucose-Capillary: 113 mg/dL — ABNORMAL HIGH (ref 70–99)

## 2013-10-01 LAB — CBC
HCT: 29 % — ABNORMAL LOW (ref 36.0–46.0)
Hemoglobin: 9.4 g/dL — ABNORMAL LOW (ref 12.0–15.0)
MCH: 29.9 pg (ref 26.0–34.0)
MCHC: 32.4 g/dL (ref 30.0–36.0)
MCV: 92.4 fL (ref 78.0–100.0)
PLATELETS: 196 10*3/uL (ref 150–400)
RBC: 3.14 MIL/uL — AB (ref 3.87–5.11)
RDW: 18.5 % — AB (ref 11.5–15.5)
WBC: 9.2 10*3/uL (ref 4.0–10.5)

## 2013-10-01 LAB — MAGNESIUM: Magnesium: 2 mg/dL (ref 1.5–2.5)

## 2013-10-01 LAB — PHOSPHORUS: Phosphorus: 3.4 mg/dL (ref 2.3–4.6)

## 2013-10-01 MED ORDER — FAT EMULSION 20 % IV EMUL
240.0000 mL | INTRAVENOUS | Status: AC
  Administered 2013-10-01: 240 mL via INTRAVENOUS
  Filled 2013-10-01: qty 250

## 2013-10-01 MED ORDER — TRACE MINERALS CR-CU-F-FE-I-MN-MO-SE-ZN IV SOLN
INTRAVENOUS | Status: AC
  Administered 2013-10-01: 18:00:00 via INTRAVENOUS
  Filled 2013-10-01: qty 2000

## 2013-10-01 NOTE — Progress Notes (Signed)
PARENTERAL NUTRITION CONSULT NOTE - Follow up  Pharmacy Consult for TNA Indication: bowel obstruction  Allergies  Allergen Reactions  . Adhesive [Tape] Other (See Comments)    Tears skin  . Amoxicillin     REACTION: RASH  . Clarithromycin     REACTION: GI UPSET  . Codeine Nausea Only and Palpitations   Patient Measurements: Height: 5\' 5"  (165.1 cm) Weight: 141 lb 12.1 oz (64.3 kg) IBW/kg (Calculated) : 57 Adjusted Body Weight: 59 kg  Vital Signs: Temp: 98.1 F (36.7 C) (01/31 0625) Temp src: Oral (01/31 0625) BP: 120/58 mmHg (01/31 0625) Pulse Rate: 92 (01/31 0625) Intake/Output from previous day: 01/30 0701 - 01/31 0700 In: -  Out: 4259 [Urine:1025; Drains:350] Intake/Output from this shift:   Labs: No results found for this basename: WBC, HGB, HCT, PLT, APTT, INR,  in the last 72 hours  Recent Labs  09/29/13 0550 10/01/13 0530  NA 134* 137  K 3.8 4.2  CL 99 103  CO2 28 26  GLUCOSE 131* 123*  BUN 12 17  CREATININE 0.78 0.63  CALCIUM 8.1* 7.9*  MG 1.8 2.0  PHOS 3.1 3.4  PROT 5.0* 5.0*  ALBUMIN 1.9* 1.7*  AST 27 24  ALT 58* 30  ALKPHOS 109 105  BILITOT 0.2* <0.2*    Recent Labs  09/30/13 1741 09/30/13 2331 10/01/13 0608  GLUCAP 128* 129* 108*   CBGs & Insulin requirements past 24 hours:  CBGs 139,161,129/108, 6 units insulin used  Nutritional Goals:  - RD recs: Kcal: 1900-2000 ; Protein: 80-90 gm protein ; Fluid: 1.9L daily - PharmD Estimated needs: 70-85 g protein, 1800-2000 Kcal  - Clinimix E5/20 @70  ml/hr  + IVF 20% at 10 ml/hr will provide: 84 g protein, 1958 Kcal per day   Current nutrition:  - Diet: NPO except sips and chips - TNA: Clinimix E5/20 at 70 mL/hr + 20% fat emulsion at 10 mL/hr - mIVF: NS  with 20 mEq KCl @ 40 mL/hr   Assessment:  77 yoF with metastatic colon cancer currently receiving chemotherapy with FOLFIRI presented 1/23 with intractable N/V. CT abd/pelvis with distal small bowel obstruction, peritoneal  carcinomatosis and stable liver metastases. Surgery on board, plan OR 1/27. Pt with a 30lbs weight loss over the past 6 months, pre-albumin 11.3, and anticipated prolonged ileus - TNA ordered to start 1/25. Patient with an implanted port on R chest with only 1 access per RN. MD notified to create peripheral access for IVF since TNA will be given via chest port.   1/30: POD3, s/p ex lap with ileal-transverse colon anastomosis and g-tube placement performed 1/27  Labs: Electrolytes: K+ 4.2, Na 137, CCa 9.5, Mag 1.8, Phos 3.1 Renal Function: Scr 0.63, UOP ~ 1 ml/kg/hr Hepatic Function: AST/ALT, Alk phos all WNL Pre-Albumin: 11.3 (1/24), 7.0 (1/26) Triglycerides: 56 (1/26)  TNA D7 TNA access: PICC  Plan:  At 1800 tonight  Continue Clinimix E 5/20 at 70 ml/hr (goal rate)  IV fat emulsion 20% at 10 ml/hr daily  TNA to contain standard multivitamins and trace elements daily   IVF Ns with 20 mEq K /Lat 43ml/hr (K+ now 4.2 with KCl in fluids)  F/u AM labs to assess need for KCl to remain in IVF  Continue sensitive SSI q6h, q6h CBGs   TNA labs Monday/Thursdays  Pharmacy will follow up daily  Thank you for the consult.  Minda Ditto PharmD Pager [phone] 10/01/2013, 8:00 AM

## 2013-10-01 NOTE — Progress Notes (Signed)
Patient ID: Kelly [name], female   DOB: Jan 07, 1941, 73 y.o.   MRN: [id number] 4 Days Post-Op  Subjective: Pt continues to have gas pain with belching.  No flatus yet.  Still a bit bloated.  Pain remains better controlled.    Objective: Vital signs in last 24 hours: Temp:  [97.6 F (36.4 C)-98.8 F (37.1 C)] 98.1 F (36.7 C) (01/31 0625) Pulse Rate:  [92-109] 92 (01/31 0625) Resp:  [16-20] 20 (01/31 0625) BP: (120-133)/(58-66) 120/58 mmHg (01/31 0625) SpO2:  [96 %-98 %] 96 % (01/31 0625) Last BM Date: 09/25/13  Intake/Output from previous day: 01/30 0701 - 01/31 0700 In: 2411.8 [I.V.:1413.8; TPN:998] Out: 2075 [Urine:1225; Drains:850] Intake/Output this shift:    PE: Abd: soft, remains sl bloated.  G-tube output Bilious in nature.  Audible bowel sounds. still tender, but seems a little less.  Dressing removed.  No evidence of cellulitis.     Lab Results:  No results found for this basename: WBC, HGB, HCT, PLT,  in the last 72 hours BMET  Recent Labs  09/29/13 0550 10/01/13 0530  NA 134* 137  K 3.8 4.2  CL 99 103  CO2 28 26  GLUCOSE 131* 123*  BUN 12 17  CREATININE 0.78 0.63  CALCIUM 8.1* 7.9*   PT/INR No results found for this basename: LABPROT, INR,  in the last 72 hours CMP     Component Value Date/Time   NA 137 10/01/2013 0530   NA 140 09/14/2013 0928   K 4.2 10/01/2013 0530   K 3.1* 09/14/2013 0928   CL 103 10/01/2013 0530   CO2 26 10/01/2013 0530   CO2 27 09/14/2013 0928   GLUCOSE 123* 10/01/2013 0530   GLUCOSE 102 09/14/2013 0928   BUN 17 10/01/2013 0530   BUN 9.4 09/14/2013 0928   CREATININE 0.63 10/01/2013 0530   CREATININE 0.6 09/14/2013 0928   CALCIUM 7.9* 10/01/2013 0530   CALCIUM 9.3 09/14/2013 0928   PROT 5.0* 10/01/2013 0530   PROT 7.0 09/14/2013 0928   ALBUMIN 1.7* 10/01/2013 0530   ALBUMIN 3.2* 09/14/2013 0928   AST 24 10/01/2013 0530   AST 20 09/14/2013 0928   ALT 30 10/01/2013 0530   ALT 10 09/14/2013 0928   ALKPHOS 105 10/01/2013 0530   ALKPHOS  106 09/14/2013 0928   BILITOT <0.2* 10/01/2013 0530   BILITOT 0.52 09/14/2013 0928   GFRNONAA 89* 10/01/2013 0530   GFRAA >90 10/01/2013 0530   Lipase     Component Value Date/Time   LIPASE 12 09/21/2013 1550       Studies/Results: No results found.  Anti-infectives: Anti-infectives   Start     Dose/Rate Route Frequency Ordered Stop   09/28/13 1200  ertapenem (INVANZ) 1 g in sodium chloride 0.9 % 50 mL IVPB     1 g 100 mL/hr over 30 Minutes Intravenous Every 24 hours 09/28/13 1103 09/30/13 1206   09/27/13 0800  [MAR Hold]  ertapenem (INVANZ) 1 g in sodium chloride 0.9 % 50 mL IVPB     (On MAR Hold since 09/27/13 1023)   1 g 100 mL/hr over 30 Minutes Intravenous On call to O.R. 09/27/13 0754 09/27/13 1202       Assessment/Plan  1. POD 3, s/p ex lap with ileal-colonic bypass and placement of g-tube 2. Stage 4 colon cancer  3. Post op ileus  4. PCM/TNA  Plan: 1. Cont TNA.  Bowel function has not returned yet so will keep g-tube to gravity for now. 2. Suppository  tomorrow if no bowel function.  Stay on sips of clears. 3. Cont mobilization and pulm toilet.   LOS: 8 days    Ronne Savoia 10/01/2013, 9:16 AM Pager: [phone]

## 2013-10-02 LAB — BASIC METABOLIC PANEL
BUN: 15 mg/dL (ref 6–23)
CHLORIDE: 99 meq/L (ref 96–112)
CO2: 26 mEq/L (ref 19–32)
Calcium: 7.9 mg/dL — ABNORMAL LOW (ref 8.4–10.5)
Creatinine, Ser: 0.47 mg/dL — ABNORMAL LOW (ref 0.50–1.10)
GFR calc Af Amer: >90 mL/min (ref >90)
Glucose, Bld: 126 mg/dL — ABNORMAL HIGH (ref 70–99)
POTASSIUM: 4.5 meq/L (ref 3.7–5.3)
SODIUM: 132 meq/L — AB (ref 137–147)

## 2013-10-02 LAB — URINALYSIS, ROUTINE W REFLEX MICROSCOPIC
BILIRUBIN URINE: NEGATIVE
Glucose, UA: 100 mg/dL — AB
Ketones, ur: NEGATIVE mg/dL
Leukocytes, UA: NEGATIVE
Nitrite: NEGATIVE
Protein, ur: NEGATIVE mg/dL
Specific Gravity, Urine: 1.018 (ref 1.005–1.030)
UROBILINOGEN UA: 0.2 mg/dL (ref 0.0–1.0)
pH: 6 (ref 5.0–8.0)

## 2013-10-02 LAB — GLUCOSE, CAPILLARY
GLUCOSE-CAPILLARY: 117 mg/dL — AB (ref 70–99)
Glucose-Capillary: 117 mg/dL — ABNORMAL HIGH (ref 70–99)
Glucose-Capillary: 137 mg/dL — ABNORMAL HIGH (ref 70–99)
Glucose-Capillary: 129 mg/dL — ABNORMAL HIGH (ref 70–99)

## 2013-10-02 LAB — URINE MICROSCOPIC-ADD ON

## 2013-10-02 MED ORDER — TRACE MINERALS CR-CU-F-FE-I-MN-MO-SE-ZN IV SOLN
INTRAVENOUS | Status: AC
  Administered 2013-10-02: 17:00:00 via INTRAVENOUS
  Filled 2013-10-02: qty 2000

## 2013-10-02 MED ORDER — FAT EMULSION 20 % IV EMUL
240.0000 mL | INTRAVENOUS | Status: AC
  Administered 2013-10-02: 240 mL via INTRAVENOUS
  Filled 2013-10-02: qty 250

## 2013-10-02 MED ORDER — BISACODYL 10 MG RE SUPP
10.0000 mg | Freq: Every day | RECTAL | Status: DC
  Administered 2013-10-02 – 2013-10-03 (×2): 10 mg via RECTAL
  Filled 2013-10-02 (×2): qty 1

## 2013-10-02 MED ORDER — HYOSCYAMINE SULFATE 0.125 MG SL SUBL
0.1250 mg | SUBLINGUAL_TABLET | Freq: Four times a day (QID) | SUBLINGUAL | Status: DC | PRN
  Administered 2013-10-02 – 2013-10-04 (×2): 0.125 mg via SUBLINGUAL
  Filled 2013-10-02 (×2): qty 1

## 2013-10-02 NOTE — Progress Notes (Signed)
Patient ID: Kelly [name], female   DOB: [birth year], 73 y.o.   MRN: [id number] 5 Days Post-Op  Subjective: Decreased gas pain.  Still belching some, but less. Feels less bloated.  Has significant pain when being moved.    Objective: Vital signs in last 24 hours: Temp:  [97.6 F (36.4 C)-98.9 F (37.2 C)] 98.1 F (36.7 C) (02/01 0500) Pulse Rate:  [88-101] 93 (02/01 0500) Resp:  [16-18] 16 (02/01 0500) BP: (112-134)/(45-59) 117/57 mmHg (02/01 0500) SpO2:  [95 %-97 %] 97 % (02/01 0500) Last BM Date: 09/25/13  Intake/Output from previous day: 01/31 0701 - 02/01 0700 In: 1620 [I.V.:980; TPN:640] Out: 2150 [Urine:1550; Drains:600] Intake/Output this shift:    PE: Alert and oriented.  Good mental status and mood.   Abd: soft, remains sl bloated, but improved.  G-tube output Bilious in nature.  Audible bowel sounds.   Lab Results:   Recent Labs  10/01/13 0925  WBC 9.2  HGB 9.4*  HCT 29.0*  PLT 196   BMET  Recent Labs  10/01/13 0530 10/02/13 0637  NA 137 132*  K 4.2 4.5  CL 103 99  CO2 26 26  GLUCOSE 123* 126*  BUN 17 15  CREATININE 0.63 0.47*  CALCIUM 7.9* 7.9*   PT/INR No results found for this basename: LABPROT, INR,  in the last 72 hours CMP     Component Value Date/Time   NA 132* 10/02/2013 0637   NA 140 09/14/2013 0928   K 4.5 10/02/2013 0637   K 3.1* 09/14/2013 0928   CL 99 10/02/2013 0637   CO2 26 10/02/2013 0637   CO2 27 09/14/2013 0928   GLUCOSE 126* 10/02/2013 0637   GLUCOSE 102 09/14/2013 0928   BUN 15 10/02/2013 0637   BUN 9.4 09/14/2013 0928   CREATININE 0.47* 10/02/2013 0637   CREATININE 0.6 09/14/2013 0928   CALCIUM 7.9* 10/02/2013 0637   CALCIUM 9.3 09/14/2013 0928   PROT 5.0* 10/01/2013 0530   PROT 7.0 09/14/2013 0928   ALBUMIN 1.7* 10/01/2013 0530   ALBUMIN 3.2* 09/14/2013 0928   AST 24 10/01/2013 0530   AST 20 09/14/2013 0928   ALT 30 10/01/2013 0530   ALT 10 09/14/2013 0928   ALKPHOS 105 10/01/2013 0530   ALKPHOS 106 09/14/2013 0928   BILITOT <0.2*  10/01/2013 0530   BILITOT 0.52 09/14/2013 0928   GFRNONAA >90 10/02/2013 0637   GFRAA >90 10/02/2013 0637   Lipase     Component Value Date/Time   LIPASE 12 09/21/2013 1550       Studies/Results: No results found.  Anti-infectives: Anti-infectives   Start     Dose/Rate Route Frequency Ordered Stop   09/28/13 1200  ertapenem (INVANZ) 1 g in sodium chloride 0.9 % 50 mL IVPB     1 g 100 mL/hr over 30 Minutes Intravenous Every 24 hours 09/28/13 1103 09/30/13 1206   09/27/13 0800  [MAR Hold]  ertapenem (INVANZ) 1 g in sodium chloride 0.9 % 50 mL IVPB     (On MAR Hold since 09/27/13 1023)   1 g 100 mL/hr over 30 Minutes Intravenous On call to O.R. 09/27/13 0754 09/27/13 1202       Assessment/Plan  1. POD 3, s/p ex lap with ileal-colonic bypass and placement of g-tube 2. Stage 4 colon cancer  3. Post op ileus  4. PCM/TNA  Plan: 1. Cont TNA.  Bowel function has not returned yet so will keep g-tube to gravity for now.  2. Try suppository.  OK for sips of any liquid.   3. Cont mobilization and pulm toilet.   LOS: 9 days    Kelly [name] 10/02/2013, 8:28 AM Pager: [phone]

## 2013-10-02 NOTE — Progress Notes (Signed)
PARENTERAL NUTRITION CONSULT NOTE - Follow up  Pharmacy Consult for TNA Indication: bowel obstruction  Allergies  Allergen Reactions  . Adhesive [Tape] Other (See Comments)    Tears skin  . Amoxicillin     REACTION: RASH  . Clarithromycin     REACTION: GI UPSET  . Codeine Nausea Only and Palpitations   Patient Measurements: Height: _0  (165.1 cm) Weight: 141 lb 12.1 oz (64.3 kg) IBW/kg (Calculated) : 57 Adjusted Body Weight: 59 kg  Vital Signs: Temp: 98.1 F (36.7 C) (02/01 0500) Temp src: Oral (02/01 0500) BP: 117/57 mmHg (02/01 0500) Pulse Rate: 93 (02/01 0500) Intake/Output from previous day: 01/31 0701 - 02/01 0700 In: 1620 [I.V.:980; TPN:640] Out: 2150 [Urine:1550; Drains:600] Intake/Output from this shift:   Labs:  Recent Labs  10/01/13 0925  WBC 9.2  HGB 9.4*  HCT 29.0*  PLT 196    Recent Labs  10/01/13 0530 10/02/13 0637  NA 137 132*  K 4.2 4.5  CL 103 99  CO2 26 26  GLUCOSE 123* 126*  BUN 17 15  CREATININE 0.63 0.47*  CALCIUM 7.9* 7.9*  MG 2.0  --   PHOS 3.4  --   PROT 5.0*  --   ALBUMIN 1.7*  --   AST 24  --   ALT 30  --   ALKPHOS 105  --   BILITOT <0.2*  --     Recent Labs  10/01/13 1800 10/01/13 2356 10/02/13 0615  GLUCAP 113* 117* 117*   CBGs & Insulin requirements past 24 hours:  CBGs 132-117, 1 unit insulin required  Nutritional Goals:  - RD recs: Kcal: 1900-2000 ; Protein: 80-90 gm protein ; Fluid: 1.9L daily - PharmD Estimated needs: 70-85 g protein, 1800-2000 Kcal  - Clinimix E5/20 _1  ml/hr  + IVF 20% at 10 ml/hr will provide: 84 g protein, 1958 Kcal per day   Current nutrition:  - Diet: NPO except sips and chips - TNA: Clinimix E5/20 at 70 mL/hr + 20% fat emulsion at 10 mL/hr - mIVF: NS  with 20 mEq KCl @ 40 mL/hr   Assessment:  27 yoF with metastatic colon cancer currently receiving chemotherapy with FOLFIRI presented 1/23 with intractable N/V. CT abd/pelvis with distal small bowel obstruction, peritoneal  carcinomatosis and stable liver metastases. Surgery on board, plan OR 1/27. Pt with a 30lbs weight loss over the past 6 months, pre-albumin 11.3, and anticipated prolonged ileus - TNA ordered to start 1/25. Patient with an implanted port on R chest with only 1 access per RN. MD notified to create peripheral access for IVF since TNA will be given via chest port.   1/30: POD3, s/p ex lap with ileal-transverse colon anastomosis and g-tube placement performed 1/27  Labs: Electrolytes: K+ 4.5, Na 132, CCa 9.5 (Cannot adjust Na in TNA) Renal Function: Scr 0.47, UOP ~ 0.75 ml/kg/hr Hepatic Function: AST/ALT, Alk phos all WNL Pre-Albumin: 11.3 (1/24), 7.0 (1/26) Triglycerides: 56 (1/26) G tube drainage: increased 900 ml/24 hr  TNA D8 TNA access: PICC  Plan:  At 1800 tonight  Continue Clinimix E 5/20 at 70 ml/hr (goal rate)  IV fat emulsion 20% at 10 ml/hr daily  TNA to contain standard multivitamins and trace elements daily   IVF Ns with 20 mEq K /Lat 46m/hr (K+ now 4.5 with KCl in fluids)  F/u AM labs to assess need for KCl to remain in IVF  Continue sensitive SSI q6h, q6h CBGs   TNA labs Monday/Thursdays  Pharmacy will follow up  daily  Thank you for the consult.  Minda Ditto PharmD Pager [phone] 10/02/2013, 8:07 AM

## 2013-10-03 ENCOUNTER — Encounter: Payer: Self-pay | Admitting: Oncology

## 2013-10-03 DIAGNOSIS — C801 Malignant (primary) neoplasm, unspecified: Secondary | ICD-10-CM

## 2013-10-03 DIAGNOSIS — C786 Secondary malignant neoplasm of retroperitoneum and peritoneum: Secondary | ICD-10-CM | POA: Diagnosis present

## 2013-10-03 LAB — PHOSPHORUS: Phosphorus: 3.4 mg/dL (ref 2.3–4.6)

## 2013-10-03 LAB — GLUCOSE, CAPILLARY
GLUCOSE-CAPILLARY: 123 mg/dL — AB (ref 70–99)
GLUCOSE-CAPILLARY: 141 mg/dL — AB (ref 70–99)
Glucose-Capillary: 120 mg/dL — ABNORMAL HIGH (ref 70–99)
Glucose-Capillary: 142 mg/dL — ABNORMAL HIGH (ref 70–99)

## 2013-10-03 LAB — CBC
HEMATOCRIT: 29.6 % — AB (ref 36.0–46.0)
Hemoglobin: 9.2 g/dL — ABNORMAL LOW (ref 12.0–15.0)
MCH: 28.8 pg (ref 26.0–34.0)
MCHC: 31.1 g/dL (ref 30.0–36.0)
MCV: 92.8 fL (ref 78.0–100.0)
PLATELETS: 274 10*3/uL (ref 150–400)
RBC: 3.19 MIL/uL — ABNORMAL LOW (ref 3.87–5.11)
RDW: 18.1 % — AB (ref 11.5–15.5)
WBC: 11.3 10*3/uL — ABNORMAL HIGH (ref 4.0–10.5)

## 2013-10-03 LAB — PREALBUMIN: PREALBUMIN: 8.7 mg/dL — AB (ref 17.0–34.0)

## 2013-10-03 LAB — MAGNESIUM: Magnesium: 2.1 mg/dL (ref 1.5–2.5)

## 2013-10-03 LAB — DIFFERENTIAL
BASOS ABS: 0 10*3/uL (ref 0.0–0.1)
Basophils Relative: 0 % (ref 0–1)
EOS ABS: 0.1 10*3/uL (ref 0.0–0.7)
Eosinophils Relative: 1 % (ref 0–5)
LYMPHS PCT: 18 % (ref 12–46)
Lymphs Abs: 2 10*3/uL (ref 0.7–4.0)
Monocytes Absolute: 1.7 10*3/uL — ABNORMAL HIGH (ref 0.1–1.0)
Monocytes Relative: 15 % — ABNORMAL HIGH (ref 3–12)
NEUTROS ABS: 7.5 10*3/uL (ref 1.7–7.7)
Neutrophils Relative %: 66 % (ref 43–77)

## 2013-10-03 LAB — COMPREHENSIVE METABOLIC PANEL
ALT: 32 U/L (ref 0–35)
AST: 38 U/L — ABNORMAL HIGH (ref 0–37)
Albumin: 1.8 g/dL — ABNORMAL LOW (ref 3.5–5.2)
Alkaline Phosphatase: 145 U/L — ABNORMAL HIGH (ref 39–117)
BILIRUBIN TOTAL: 0.3 mg/dL (ref 0.3–1.2)
BUN: 14 mg/dL (ref 6–23)
CHLORIDE: 100 meq/L (ref 96–112)
CO2: 26 mEq/L (ref 19–32)
Calcium: 8.2 mg/dL — ABNORMAL LOW (ref 8.4–10.5)
Creatinine, Ser: 0.41 mg/dL — ABNORMAL LOW (ref 0.50–1.10)
GLUCOSE: 123 mg/dL — AB (ref 70–99)
Potassium: 4.4 mEq/L (ref 3.7–5.3)
Sodium: 136 mEq/L — ABNORMAL LOW (ref 137–147)
Total Protein: 5.9 g/dL — ABNORMAL LOW (ref 6.0–8.3)

## 2013-10-03 LAB — TRIGLYCERIDES: Triglycerides: 127 mg/dL (ref <150)

## 2013-10-03 MED ORDER — MENTHOL 3 MG MT LOZG: 1.0000 | LOZENGE | OROMUCOSAL | Status: DC | PRN

## 2013-10-03 MED ORDER — ONDANSETRON 8 MG/NS 50 ML IVPB
8.0000 mg | Freq: Four times a day (QID) | INTRAVENOUS | Status: DC | PRN
  Filled 2013-10-03: qty 8

## 2013-10-03 MED ORDER — PANTOPRAZOLE SODIUM 40 MG PO TBEC
40.0000 mg | DELAYED_RELEASE_TABLET | Freq: Every day | ORAL | Status: DC
  Administered 2013-10-03 – 2013-10-06 (×4): 40 mg via ORAL
  Filled 2013-10-03 (×3): qty 1

## 2013-10-03 MED ORDER — BISACODYL 10 MG RE SUPP
10.0000 mg | Freq: Every day | RECTAL | Status: DC
  Filled 2013-10-03: qty 1

## 2013-10-03 MED ORDER — PHENOL 1.4 % MT LIQD: 2.0000 | OROMUCOSAL | Status: DC | PRN

## 2013-10-03 MED ORDER — ONDANSETRON HCL 4 MG/2ML IJ SOLN
4.0000 mg | Freq: Four times a day (QID) | INTRAMUSCULAR | Status: DC | PRN
  Administered 2013-10-05 – 2013-10-06 (×3): 4 mg via INTRAVENOUS
  Filled 2013-10-03 (×3): qty 2

## 2013-10-03 MED ORDER — FAT EMULSION 20 % IV EMUL
240.0000 mL | INTRAVENOUS | Status: AC
  Administered 2013-10-03: 240 mL via INTRAVENOUS
  Filled 2013-10-03: qty 250

## 2013-10-03 MED ORDER — PROMETHAZINE HCL 25 MG/ML IJ SOLN
6.2500 mg | Freq: Four times a day (QID) | INTRAMUSCULAR | Status: DC | PRN
  Administered 2013-10-05 – 2013-10-06 (×2): 12.5 mg via INTRAVENOUS
  Filled 2013-10-03 (×2): qty 1

## 2013-10-03 MED ORDER — LIP MEDEX EX OINT
1.0000 "application " | TOPICAL_OINTMENT | Freq: Two times a day (BID) | CUTANEOUS | Status: DC
  Administered 2013-10-04 – 2013-10-05 (×2): 1 via TOPICAL
  Filled 2013-10-03: qty 7

## 2013-10-03 MED ORDER — LACTATED RINGERS IV BOLUS (SEPSIS): 1000.0000 mL | Freq: Three times a day (TID) | INTRAVENOUS | Status: AC | PRN

## 2013-10-03 MED ORDER — MAGIC MOUTHWASH
15.0000 mL | Freq: Four times a day (QID) | ORAL | Status: DC | PRN
  Filled 2013-10-03: qty 15

## 2013-10-03 MED ORDER — ALUM & MAG HYDROXIDE-SIMETH 200-200-20 MG/5ML PO SUSP: 30.0000 mL | Freq: Four times a day (QID) | ORAL | Status: DC | PRN

## 2013-10-03 MED ORDER — METOPROLOL TARTRATE 1 MG/ML IV SOLN
5.0000 mg | Freq: Four times a day (QID) | INTRAVENOUS | Status: DC | PRN
  Filled 2013-10-03: qty 5

## 2013-10-03 MED ORDER — ONDANSETRON HCL 4 MG PO TABS: 4.0000 mg | ORAL_TABLET | Freq: Four times a day (QID) | ORAL | Status: DC | PRN

## 2013-10-03 MED ORDER — TRACE MINERALS CR-CU-F-FE-I-MN-MO-SE-ZN IV SOLN
INTRAVENOUS | Status: AC
  Administered 2013-10-03: 17:00:00 via INTRAVENOUS
  Filled 2013-10-03: qty 2000

## 2013-10-03 MED ORDER — ONDANSETRON HCL 4 MG/2ML IJ SOLN: 4.0000 mg | Freq: Four times a day (QID) | INTRAMUSCULAR | Status: DC | PRN

## 2013-10-03 NOTE — Progress Notes (Signed)
NUTRITION FOLLOW UP  Intervention:   TNA per pharmacy Diet advancement per MD Consider addition of Resource Breeze supplement as tolerated Will continue to monitor  Nutrition Dx:   Inadequate oral intake related to poor appetite and nause as evidenced by hx.    Goal:   Pt to meet >/= 90% of their estimated nutrition needs    Monitor:   TPN to meet >/= 90% of their estimated nutrition needs    Assessment:   1/25: 73 yo F with stage 4 colon cancer and malignant partial SBO, hepatic metastases, peritoneal carcinomatosis. Patient has been receiving chemo and has been followed closely by the [place] RD.  Patient meets criteria for Severe Malnutrition in the context of chronic illness AEB weight loss of 6% in the past month  and intake <75% for > 1 month.  1/28: -Pt s/p ex lap w/ileal-transverse and Gtube placement. Underwent procedure on 1/27. -Pharmacy noted pt with goal rate of Clinimix E5/20 @70  ml/hr + IVF 20% at 10 ml/hr to provide 1958 Kcal and 84 gram protein -Pt currently receiving Clinimix E5/20 at 40 mL/hr + 20% fat emulsion at 10 mL/hr and NS with 20 mEq KCl @ 60 mL/hr via peripheral access -Per pharmacy note, plan to increase Clinimix E 5/20 to 60 ml/hr. Will decrease IVF to 45ml/hr when TNA rate and monitor need for KCI -Hypoactive bowel sounds. Bowel function has not yet returned per MD -NGT removed on 1/27 -Phos, Mag and K all WNL. CBGs 136-180. -Will monitor need for tube feed via recently placed Gtube  2/02: -Pt receiving Clinimix E 5/20 at 70 ml/hr w/IV fat emulsion 20% at 10 ml/hr daily -This provides 1958 kcal/day and 84 gram protein, meeting 100% of estimated nutrition needs -Per discussion with RN, pt with BM today. G-tube in use for gravity, no plans for TF use at this time. Received Dulcolax on 2/01. -Pt on sips of clears diet. Has been drinking small amounts of water, orange juice and cranberry juice. Pt denied any nausea or stomach pains  after beverage consumption. May benefit from tea and/or coffee consumption to assist in stimulating bowel movements -Pt reported being unable to tolerate Ensure/Boost like supplements pta. Has been able to consume [employer]. Will recommend to add as tolerated as this is only Day 2 of clear liquids. Encouraged use of ginger ale/sprite to increase supplements tolerability -Phos, Mag and K all WNL -Will check triglycerides on 2/05 per pharmacy note    Height: Ht Readings from Last 1 Encounters:  09/23/13 5\' 5"  (1.651 m)    Weight Status:   Wt Readings from Last 1 Encounters:  09/23/13 141 lb 12.1 oz (64.3 kg)    Re-estimated needs:  Kcal: 1900-2000  Protein: 80-90 gm protein  Fluid: 1.9L daily   Skin: WDL  Diet Order: Clear Liquid   Intake/Output Summary (Last 24 hours) at 10/03/13 1422 Last data filed at 10/03/13 1300  Gross per 24 hour  Intake 4067.16 ml  Output   2450 ml  Net 1617.16 ml    Last BM: 2/02   Labs:   Recent Labs Lab 09/29/13 0550 10/01/13 0530 10/02/13 0637 10/03/13 0540  NA 134* 137 132* 136*  K 3.8 4.2 4.5 4.4  CL 99 103 99 100  CO2 28 26 26 26   BUN 12 17 15 14   CREATININE 0.78 0.63 0.47* 0.41*  CALCIUM 8.1* 7.9* 7.9* 8.2*  MG 1.8 2.0  --  2.1  PHOS 3.1 3.4  --  3.4  GLUCOSE 131* 123* 126* 123*    CBG (last 3)   Recent Labs  10/03/13 0037 10/03/13 0558 10/03/13 1154  GLUCAP 142* 123* 141*    Scheduled Meds: . bisacodyl  10 mg Rectal Daily  . enoxaparin (LOVENOX) injection  40 mg Subcutaneous Q24H  . HYDROmorphone PCA 0.3 mg/mL   Intravenous Q4H  . insulin aspart  0-9 Units Subcutaneous Q6H  . loratadine  10 mg Oral Daily  . pantoprazole (PROTONIX) IV  40 mg Intravenous Q24H  . sodium chloride  10-40 mL Intracatheter Q12H  . sodium chloride  10-40 mL Intracatheter Q12H    Continuous Infusions: . 0.9 % NaCl with KCl 20 mEq / L 40 mL/hr at 10/02/13 1729  . Marland KitchenTPN (CLINIMIX-E) Adult 70 mL/hr at 10/02/13 1716   And  .  fat emulsion 240 mL (10/02/13 1716)  . Marland KitchenTPN (CLINIMIX-E) Adult     And  . fat emulsion      Atlee Abide MS RD LDN Clinical Dietitian [phone]

## 2013-10-03 NOTE — Progress Notes (Signed)
RN was asked by Dr.Sherrill to ask [facility]CS if CBG checks could be reduced as a request by pt.  NP for CCS was asked regarding this issue and she stated that CBG checks could not be reduced in frequency and would need to remain at every 6 hours since pt. Has an order for continuous TPN at this time.

## 2013-10-03 NOTE — Progress Notes (Signed)
PARENTERAL NUTRITION CONSULT NOTE - Follow up  Pharmacy Consult for TNA Indication: bowel obstruction  Allergies  Allergen Reactions  . Adhesive [Tape] Other (See Comments)    Tears skin  . Amoxicillin     REACTION: RASH  . Clarithromycin     REACTION: GI UPSET  . Codeine Nausea Only and Palpitations   Patient Measurements: Height: 5\' 5"  (165.1 cm) Weight: 141 lb 12.1 oz (64.3 kg) IBW/kg (Calculated) : 57 Adjusted Body Weight: 59 kg  Vital Signs: Temp: 98.2 F (36.8 C) (02/02 0200) Temp src: Oral (02/02 0200) BP: 128/57 mmHg (02/02 0200) Pulse Rate: 97 (02/02 0200) Intake/Output from previous day: 02/01 0701 - 02/02 0700 In: 2937.2 [P.O.:50; I.V.:630; TPN:2257.2] Out: 1950 [Urine:1350; Drains:600] Intake/Output from this shift:   Labs:  Recent Labs  10/01/13 0925 10/03/13 0540  WBC 9.2 11.3*  HGB 9.4* 9.2*  HCT 29.0* 29.6*  PLT 196 274    Recent Labs  10/01/13 0530 10/02/13 0637 10/03/13 0540  NA 137 132* 136*  K 4.2 4.5 4.4  CL 103 99 100  CO2 26 26 26   GLUCOSE 123* 126* 123*  BUN 17 15 14   CREATININE 0.63 0.47* 0.41*  CALCIUM 7.9* 7.9* 8.2*  MG 2.0  --  2.1  PHOS 3.4  --  3.4  PROT 5.0*  --  5.9*  ALBUMIN 1.7*  --  1.8*  AST 24  --  38*  ALT 30  --  32  ALKPHOS 105  --  145*  BILITOT <0.2*  --  0.3  TRIG  --   --  127    Recent Labs  10/02/13 1704 10/03/13 0037 10/03/13 0558  GLUCAP 129* 142* 123*   CBGs & Insulin requirements past 24 hours:  CBGs 142-123, 3 units insulin required  Nutritional Goals:  - RD recs: Kcal: 1900-2000 ; Protein: 80-90 gm protein ; Fluid: 1.9L daily - PharmD Estimated needs: 70-85 g protein, 1800-2000 Kcal  - Clinimix E5/20 @70  ml/hr  + IVF 20% at 10 ml/hr will provide: 84 g protein, 1958 Kcal per day   Current nutrition:  - Diet: NPO except sips and chips - TNA: Clinimix E5/20 at 70 mL/hr + 20% fat emulsion at 10 mL/hr - mIVF: NS  with 20 mEq KCl @ 40 mL/hr   Assessment:  30 yoF with metastatic  colon cancer currently receiving chemotherapy with FOLFIRI presented 1/23 with intractable N/V. CT abd/pelvis with distal small bowel obstruction, peritoneal carcinomatosis and stable liver metastases. Surgery on board, plan OR 1/27. Pt with a 30lbs weight loss over the past 6 months, pre-albumin 11.3, and anticipated prolonged ileus - TNA ordered to start 1/25.    2/2: POD6, s/p ex lap with ileal-transverse colon anastomosis and g-tube placement performed 1/27. Remains with hypoactive BS, no flatus. To start sips of clears per Surgery today  Labs: Electrolytes: K+ 4.4, Na 136 (can not adjust in TNA), CCa 9.96, Phos 3.4, Mag 3.1 Renal Function: Scr 0.41, UOP ~ 0.9 ml/kg/hr Hepatic Function: ALT, Alk phos, Tbili all WNL, new slight incr in AST to 38 Pre-Albumin: 11.3 (1/24), 7.0 (1/26) Triglycerides: up to 127 (2/2), 56 (1/26) G tube drainage: remains with output, 600 ml/24 hr  TNA D9 TNA access: PICC  Plan:  At 1800 tonight  Continue Clinimix E 5/20 at 70 ml/hr (goal rate)  IV fat emulsion 20% at 10 ml/hr daily  TNA to contain standard multivitamins and trace elements daily   IVF NS with 20 mEq K /Lat 37ml/hr (  K+ stable with KCl in fluids)  Continue sensitive SSI q6h, q6h CBGs   TNA labs Monday/Thursdays  Will check triglycerides on 2/5 with other TNA labs to assess for another increase  Pharmacy will follow up daily  Thank you for the consult.  Johny Drilling, PharmD, BCPS Pager: [phone] Pharmacy: [phone] 10/03/2013 10:27 AM

## 2013-10-03 NOTE — Progress Notes (Signed)
Awaiting return of bowel function Working to palliate pain/bloating  Continue to mobilize to help w recovery  Adin Hector, M.D., F.A.C.S. Gastrointestinal and Minimally Invasive Surgery [facility] [address], [place], [place] [address] [phone] Main / Paging

## 2013-10-03 NOTE — Progress Notes (Signed)
Put daughter's fmla form on nurse's desk. °

## 2013-10-03 NOTE — Progress Notes (Signed)
Patient ID: Kelly [name], female   DOB: Dec 06, 1940, 73 y.o.   MRN: [id number]  Subjective: Decreased pain, intermittent gas pains.  No flatus.  No n/v.  Walked in hallways yesterday.  Reports hematuria.   Objective:  Vital signs:  Filed Vitals:   10/02/13 2145 10/03/13 0020 10/03/13 0200 10/03/13 0733  BP: 143/63  128/57   Pulse: 103  97   Temp: 100.3 F (37.9 C)  98.2 F (36.8 C)   TempSrc: Axillary  Oral   Resp: $Remo'18 18 16 16  'shzAH$ Height:      Weight:      SpO2: 99% 99% 98%     Last BM Date: 09/27/13  Intake/Output   Yesterday:  02/01 0701 - 02/02 0700 In: 2937.2 [P.O.:50; I.V.:630; DXI:3382.5] Out: 1950 [Urine:1350; Drains:600] This shift:     Physical Exam: General: Pt awake/alert/oriented x3 in no acute distress Abdomen: bowel sounds are present.  Mildly distended.  Midline incision with approximated edges, no erythema, staples are intact.  Left abd G-tube to gravity.     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    Results:   Labs: Results for orders placed during the hospital encounter of 09/23/13 (from the past 48 hour(s))  GLUCOSE, CAPILLARY     Status: Abnormal   Collection Time    10/01/13 11:46 AM      Result Value Range   Glucose-Capillary 132 (*) 70 - 99 mg/dL  GLUCOSE, CAPILLARY     Status: Abnormal   Collection Time    10/01/13  6:00 PM      Result Value Range   Glucose-Capillary 113 (*) 70 - 99 mg/dL  GLUCOSE, CAPILLARY     Status: Abnormal   Collection Time    10/01/13 11:56 PM      Result Value Range   Glucose-Capillary 117 (*) 70 - 99 mg/dL   Comment 1 Documented in Chart     Comment 2 Notify RN    GLUCOSE, CAPILLARY     Status: Abnormal   Collection Time    10/02/13  6:15 AM      Result Value Range   Glucose-Capillary 117 (*) 70 - 99 mg/dL   Comment 1 Notify RN    BASIC METABOLIC PANEL      Status: Abnormal   Collection Time    10/02/13  6:37 AM      Result Value Range   Sodium 132 (*) 137 - 147 mEq/L   Potassium 4.5  3.7 - 5.3 mEq/L   Chloride 99  96 - 112 mEq/L   CO2 26  19 - 32 mEq/L   Glucose, Bld 126 (*) 70 - 99 mg/dL   BUN 15  6 - 23 mg/dL   Creatinine, Ser 0.47 (*) 0.50 - 1.10 mg/dL   Calcium 7.9 (*) 8.4 - 10.5 mg/dL   GFR calc non Af Amer >90  >90 mL/min   GFR calc Af Amer >90  >90 mL/min   Comment: (NOTE)     The eGFR has been calculated using the CKD EPI equation.     This calculation has not been validated in all clinical situations.     eGFR's persistently <90 mL/min signify possible Chronic Kidney     Disease.  GLUCOSE, CAPILLARY     Status: Abnormal   Collection Time    10/02/13 12:00 PM  Result Value Range   Glucose-Capillary 137 (*) 70 - 99 mg/dL  GLUCOSE, CAPILLARY     Status: Abnormal   Collection Time    10/02/13  5:04 PM      Result Value Range   Glucose-Capillary 129 (*) 70 - 99 mg/dL  URINALYSIS, ROUTINE W REFLEX MICROSCOPIC     Status: Abnormal   Collection Time    10/02/13  9:53 PM      Result Value Range   Color, Urine YELLOW  YELLOW   APPearance CLEAR  CLEAR   Specific Gravity, Urine 1.018  1.005 - 1.030   pH 6.0  5.0 - 8.0   Glucose, UA 100 (*) NEGATIVE mg/dL   Hgb urine dipstick LARGE (*) NEGATIVE   Bilirubin Urine NEGATIVE  NEGATIVE   Ketones, ur NEGATIVE  NEGATIVE mg/dL   Protein, ur NEGATIVE  NEGATIVE mg/dL   Urobilinogen, UA 0.2  0.0 - 1.0 mg/dL   Nitrite NEGATIVE  NEGATIVE   Leukocytes, UA NEGATIVE  NEGATIVE  URINE MICROSCOPIC-ADD ON     Status: Abnormal   Collection Time    10/02/13  9:53 PM      Result Value Range   Squamous Epithelial / LPF FEW (*) RARE   WBC, UA 0-2  <3 WBC/hpf   RBC / HPF 21-50  <3 RBC/hpf  GLUCOSE, CAPILLARY     Status: Abnormal   Collection Time    10/03/13 12:37 AM      Result Value Range   Glucose-Capillary 142 (*) 70 - 99 mg/dL   Comment 1 Notify RN    COMPREHENSIVE METABOLIC PANEL      Status: Abnormal   Collection Time    10/03/13  5:40 AM      Result Value Range   Sodium 136 (*) 137 - 147 mEq/L   Potassium 4.4  3.7 - 5.3 mEq/L   Chloride 100  96 - 112 mEq/L   CO2 26  19 - 32 mEq/L   Glucose, Bld 123 (*) 70 - 99 mg/dL   BUN 14  6 - 23 mg/dL   Creatinine, Ser 0.41 (*) 0.50 - 1.10 mg/dL   Calcium 8.2 (*) 8.4 - 10.5 mg/dL   Total Protein 5.9 (*) 6.0 - 8.3 g/dL   Albumin 1.8 (*) 3.5 - 5.2 g/dL   AST 38 (*) 0 - 37 U/L   ALT 32  0 - 35 U/L   Alkaline Phosphatase 145 (*) 39 - 117 U/L   Total Bilirubin 0.3  0.3 - 1.2 mg/dL   GFR calc non Af Amer >90  >90 mL/min   GFR calc Af Amer >90  >90 mL/min   Comment: (NOTE)     The eGFR has been calculated using the CKD EPI equation.     This calculation has not been validated in all clinical situations.     eGFR's persistently <90 mL/min signify possible Chronic Kidney     Disease.  MAGNESIUM     Status: None   Collection Time    10/03/13  5:40 AM      Result Value Range   Magnesium 2.1  1.5 - 2.5 mg/dL  PHOSPHORUS     Status: None   Collection Time    10/03/13  5:40 AM      Result Value Range   Phosphorus 3.4  2.3 - 4.6 mg/dL  CBC     Status: Abnormal   Collection Time    10/03/13  5:40 AM      Result  Value Range   WBC 11.3 (*) 4.0 - 10.5 K/uL   RBC 3.19 (*) 3.87 - 5.11 MIL/uL   Hemoglobin 9.2 (*) 12.0 - 15.0 g/dL   HCT 29.6 (*) 36.0 - 46.0 %   MCV 92.8  78.0 - 100.0 fL   MCH 28.8  26.0 - 34.0 pg   MCHC 31.1  30.0 - 36.0 g/dL   RDW 18.1 (*) 11.5 - 15.5 %   Platelets 274  150 - 400 K/uL   Comment: REPEATED TO VERIFY     DELTA CHECK NOTED  DIFFERENTIAL     Status: Abnormal   Collection Time    10/03/13  5:40 AM      Result Value Range   Neutrophils Relative % 66  43 - 77 %   Lymphocytes Relative 18  12 - 46 %   Monocytes Relative 15 (*) 3 - 12 %   Eosinophils Relative 1  0 - 5 %   Basophils Relative 0  0 - 1 %   Neutro Abs 7.5  1.7 - 7.7 K/uL   Lymphs Abs 2.0  0.7 - 4.0 K/uL   Monocytes Absolute 1.7  (*) 0.1 - 1.0 K/uL   Eosinophils Absolute 0.1  0.0 - 0.7 K/uL   Basophils Absolute 0.0  0.0 - 0.1 K/uL   RBC Morphology POLYCHROMASIA PRESENT    TRIGLYCERIDES     Status: None   Collection Time    10/03/13  5:40 AM      Result Value Range   Triglycerides 127  <150 mg/dL   Comment: Performed at [facility], CAPILLARY     Status: Abnormal   Collection Time    10/03/13  5:58 AM      Result Value Range   Glucose-Capillary 123 (*) 70 - 99 mg/dL   Comment 1 Notify RN      Imaging / Studies: No results found.  Medications / Allergies: per chart  Antibiotics: Anti-infectives   Start     Dose/Rate Route Frequency Ordered Stop   09/28/13 1200  ertapenem (INVANZ) 1 g in sodium chloride 0.9 % 50 mL IVPB     1 g 100 mL/hr over 30 Minutes Intravenous Every 24 hours 09/28/13 1103 09/30/13 1206   09/27/13 0800  [MAR Hold]  ertapenem (INVANZ) 1 g in sodium chloride 0.9 % 50 mL IVPB     (On MAR Hold since 09/27/13 1023)   1 g 100 mL/hr over 30 Minutes Intravenous On call to O.R. 09/27/13 0754 09/27/13 1202      Assessment/Plan Stage 4 colon cancer PCM exploratory laparotomy with ileocolonic bypass and placement of g-tube.  POD#6 -await bowel function, continue g tube to gravity.  May have small sips of clears -mobilize, walking in hallways now -encourage IS use -continue PCA, transition to oral pain meds and IV for breakthrough pain when bowel function returns -continue TNA   Erby Pian, [facility] Pager [phone] Office [phone]  10/03/2013 10:28 AM

## 2013-10-04 LAB — GLUCOSE, CAPILLARY
GLUCOSE-CAPILLARY: 148 mg/dL — AB (ref 70–99)
GLUCOSE-CAPILLARY: 113 mg/dL — AB (ref 70–99)
Glucose-Capillary: 123 mg/dL — ABNORMAL HIGH (ref 70–99)
Glucose-Capillary: 114 mg/dL — ABNORMAL HIGH (ref 70–99)

## 2013-10-04 LAB — CREATININE, SERUM
CREATININE: 0.4 mg/dL — AB (ref 0.50–1.10)
GFR calc Af Amer: >90 mL/min (ref >90)
GFR calc non Af Amer: >90 mL/min (ref >90)

## 2013-10-04 MED ORDER — TRACE MINERALS CR-CU-F-FE-I-MN-MO-SE-ZN IV SOLN
INTRAVENOUS | Status: DC
  Administered 2013-10-04: 18:00:00 via INTRAVENOUS
  Filled 2013-10-04: qty 2000

## 2013-10-04 MED ORDER — FAT EMULSION 20 % IV EMUL
240.0000 mL | INTRAVENOUS | Status: DC
  Administered 2013-10-04: 240 mL via INTRAVENOUS
  Filled 2013-10-04: qty 250

## 2013-10-04 MED ORDER — KETOROLAC TROMETHAMINE 15 MG/ML IJ SOLN
15.0000 mg | Freq: Once | INTRAMUSCULAR | Status: AC
  Administered 2013-10-04: 15 mg via INTRAVENOUS
  Filled 2013-10-04: qty 1

## 2013-10-04 MED ORDER — POLYETHYLENE GLYCOL 3350 17 G PO PACK
17.0000 g | PACK | Freq: Every day | ORAL | Status: DC
  Administered 2013-10-04: 17 g via ORAL
  Filled 2013-10-04 (×2): qty 1

## 2013-10-04 MED ORDER — ACETAMINOPHEN 500 MG PO TABS
1000.0000 mg | ORAL_TABLET | Freq: Three times a day (TID) | ORAL | Status: DC
  Administered 2013-10-04 – 2013-10-06 (×6): 1000 mg via ORAL
  Filled 2013-10-04 (×9): qty 2

## 2013-10-04 MED ORDER — BISACODYL 10 MG RE SUPP: 10.0000 mg | Freq: Every day | RECTAL | Status: DC | PRN

## 2013-10-04 MED ORDER — ALPRAZOLAM 0.25 MG PO TABS
0.1250 mg | ORAL_TABLET | Freq: Two times a day (BID) | ORAL | Status: DC | PRN
  Administered 2013-10-06 (×2): 0.125 mg via ORAL
  Filled 2013-10-04 (×2): qty 1

## 2013-10-04 MED ORDER — INSULIN ASPART 100 UNIT/ML ~~LOC~~ SOLN
0.0000 [IU] | Freq: Three times a day (TID) | SUBCUTANEOUS | Status: DC
  Administered 2013-10-04: 1 [IU] via SUBCUTANEOUS

## 2013-10-04 MED ORDER — HYDROMORPHONE HCL 2 MG PO TABS
2.0000 mg | ORAL_TABLET | ORAL | Status: DC | PRN
  Administered 2013-10-04: 2 mg via ORAL
  Filled 2013-10-04: qty 1

## 2013-10-04 NOTE — Progress Notes (Signed)
Occupational Therapy Evaluation Patient Details Name: Kelly [name] MRN: [id number] DOB: Jan 07, 1941 Today's Date: 10/04/2013 Time: 3818-2993 OT Time Calculation (min): 13 min  OT Assessment / Plan / Recommendation History of present illness a hx of stage 4 colon cancer currently on chemo who presents with persistent n/v. Pt was seen in ED on 1/21 for the same sx. Imaging at that time demonstrated nonspecific bowel gas patterns. The patient reportedly felt better but soon the sx continued and the patient was unable to tolerate regular PO. Thus far, only tolerating popsicles. Pt was seen in clinic today where she continued with intractable n/v and was referred for direct admission for further work up.  OT Assessment  Patient needs continued OT Services    Follow Up Recommendations  Home health OT;Supervision/Assistance - 24 hour    Barriers to Discharge      Equipment Recommendations  3 in 1 bedside comode;Tub/shower seat;Tub/shower bench    Recommendations for Other Services    Frequency  Min 2X/week    Precautions / Restrictions Precautions Precautions: Fall Restrictions Weight Bearing Restrictions: No   Pertinent Vitals/Pain No c/o pain    ADL  Eating/Feeding: Simulated;Set up Where Assessed - Eating/Feeding: Bed level Grooming: Simulated;Wash/dry hands;Wash/dry face Where Assessed - Grooming: Supine, head of bed up Tub/Shower Transfer: Other (comment) (worried about tub bench vs tub seat  needed) Transfers/Ambulation Related to ADLs: Bed level eval today; spoke at length with dtr Kelly [name] regarding their concerns. Main concern is tub transfer. Will practice next session to see if pt needs tub seat vs tub bench. Dtr would also like for pt to have HHPT/OT safety evals and HHRN. ADL Comments: See above for details. Pt reports she ambulated to bathroom today with nursing staff.    OT Diagnosis: Generalized weakness  OT Problem List: Decreased strength;Decreased activity  tolerance;Decreased knowledge of use of DME or AE OT Treatment Interventions: Self-care/ADL training;Therapeutic exercise;DME and/or AE instruction;Therapeutic activities;Patient/family education   OT Goals(Current goals can be found in the care plan section) Acute Rehab OT Goals Patient Stated Goal: I want to go home soon OT Goal Formulation: With patient Time For Goal Achievement: 10/18/13 Potential to Achieve Goals: Good  Visit Information  Last OT Received On: 10/04/13 Assistance Needed: +1 History of Present Illness: a hx of stage 4 colon cancer currently on chemo who presents with persistent n/v. Pt was seen in ED on 1/21 for the same sx. Imaging at that time demonstrated nonspecific bowel gas patterns. The patient reportedly felt better but soon the sx continued and the patient was unable to tolerate regular PO. Thus far, only tolerating popsicles. Pt was seen in clinic today where she continued with intractable n/v and was referred for direct admission for further work up.       Prior [place] expects to be discharged to:: Private residence Living Arrangements: Spouse/significant other;Children Available Help at Discharge: Family Type of Home: House Home Access: Level entry [place]: One [place]: Environmental consultant - 2 wheels;Bedside commode;Shower seat;Wheelchair - manual Prior Function Level of Independence: Needs assistance ADL's / Homemaking Assistance Needed: spouse has been assisting in/out of tub Communication Communication: No difficulties         Vision/Perception Vision - History Baseline Vision: Wears glasses all the time   Cognition  Cognition Arousal/Alertness: Awake/alert Behavior During Therapy: WFL for tasks assessed/performed Overall Cognitive Status: Within Functional Limits for tasks assessed    Extremity/Trunk Assessment Upper Extremity Assessment Upper Extremity Assessment:  Generalized weakness Lower  Extremity Assessment Lower Extremity Assessment: Defer to PT evaluation     End of Session OT - End of Session Activity Tolerance: Patient tolerated treatment well Patient left: in bed;with call bell/phone within reach  GO     Kelly [name] A 10/04/2013, 5:21 PM

## 2013-10-04 NOTE — Care Management Note (Addendum)
Patient discussed in LOS meeting. Per MD advisor, CM to consult attending and Oncologist concerning pt's appropriateness for LTACH. Pt appropriate if not anticipating chemo within next month. Cm spoke with Dr.Sherrill whom states was unable to speak with patient concerning plans for chemo this am, pt asked physician to leave due to pain. Per Dr.Sherrill will discuss plans for chemo as outpatient. Cm spoke with [facility] NP Emina Reibock who states service anticipate pt to discharge in 2-3 days upon tolerating advanced diet. Will continue to follow.   Venita Lick Bawi Lakins,MSN,RN [phone]

## 2013-10-04 NOTE — Progress Notes (Signed)
PARENTERAL NUTRITION CONSULT NOTE - Follow up  Pharmacy Consult for TNA Indication: bowel obstruction  Allergies  Allergen Reactions  . Adhesive [Tape] Other (See Comments)    Tears skin  . Amoxicillin     REACTION: RASH  . Clarithromycin     REACTION: GI UPSET  . Codeine Nausea Only and Palpitations   Patient Measurements: Height: _0  (165.1 cm) Weight: 142 lb 1.6 oz (64.456 kg) IBW/kg (Calculated) : 57 Adjusted Body Weight: 59 kg  Vital Signs: Temp: 97.6 F (36.4 C) (02/03 1040) Temp src: Axillary (02/03 1040) BP: 117/54 mmHg (02/03 1040) Pulse Rate: 97 (02/03 1040) Intake/Output from previous day: 02/02 0701 - 02/03 0700 In: 1250 [P.O.:120; I.V.:570; TPN:560] Out: 2100 [Urine:1650; Drains:450] Intake/Output from this shift: Total I/O In: 360 [P.O.:360] Out: -  Labs:  Recent Labs  10/03/13 0540  WBC 11.3*  HGB 9.2*  HCT 29.6*  PLT 274    Recent Labs  10/02/13 0637 10/03/13 0540 10/04/13 0500  NA 132* 136*  --   K 4.5 4.4  --   CL 99 100  --   CO2 26 26  --   GLUCOSE 126* 123*  --   BUN 15 14  --   CREATININE 0.47* 0.41* 0.40*  CALCIUM 7.9* 8.2*  --   MG  --  2.1  --   PHOS  --  3.4  --   PROT  --  5.9*  --   ALBUMIN  --  1.8*  --   AST  --  38*  --   ALT  --  32  --   ALKPHOS  --  145*  --   BILITOT  --  0.3  --   PREALBUMIN  --  8.7*  --   TRIG  --  127  --     Recent Labs  10/03/13 1658 10/04/13 0057 10/04/13 0602  GLUCAP 120* 123* 114*   CBGs & Insulin requirements past 24 hours:  CBGs 114-141, 3 units insulin required  Nutritional Goals:  - RD recs: Kcal: 1900-2000 ; Protein: 80-90 gm protein ; Fluid: 1.9L daily - PharmD Estimated needs: 70-85 g protein, 1800-2000 Kcal  - Clinimix E5/20 _1  ml/hr  + IVF 20% at 10 ml/hr will provide: 84 g protein, 1958 Kcal per day   Current nutrition:  - Diet: FL to start 2/3, clamp NGT - TNA: Clinimix E5/20 at 70 mL/hr + 20% fat emulsion at 10 mL/hr - mIVF: NS  with 20 mEq KCl @ 40  mL/hr   Assessment:  63 yoF with metastatic colon cancer currently receiving chemotherapy with FOLFIRI presented 1/23 with intractable N/V. CT abd/pelvis with distal small bowel obstruction, peritoneal carcinomatosis and stable liver metastases. Surgery on board, plan OR 1/27. Pt with a 30lbs weight loss over the past 6 months, pre-albumin 11.3, and anticipated prolonged ileus - TNA ordered to start 1/25.    2/3: POD7, s/p ex lap with ileal-transverse colon anastomosis and g-tube placement performed 1/27. Per notes, 2 BMs last night, walking, still with some gas pains this AM, plan clamp NGT and start FL with calorie count. Will begin weaning of TNA once tolerates solids  Labs: Electrolytes: K+ 4.4, Na 136 (can not adjust in TNA), CCa 9.96, Phos 3.4, Mag 3.1 Renal Function: Scr 0.41, UOP ~ 1.1 ml/kg/hr Hepatic Function: ALT, Alk phos, Tbili all WNL, new slight incr in AST to 38 Pre-Albumin: 11.3 (1/24), 7.0 (1/26) Triglycerides: up to 127 (2/2), 56 (1/26) G tube drainage:  remains with output, 450 ml/24 hr - but clamping starting 2/3  TNA D10 TNA access: PICC  Plan:  At 1800 tonight  Continue Clinimix E 5/20 at 70 ml/hr (goal rate)  IV fat emulsion 20% at 10 ml/hr daily  TNA to contain standard multivitamins and trace elements daily   IVF NS with 20 mEq K /Lat 49m/hr (K+ stable with KCl in fluids)  [facility]  TNA labs Monday/Thursdays  Will check triglycerides on 2/5 with other TNA labs to assess for another increase  Pharmacy will follow up daily  Noted plan to start weaning TNA once patient tolerates solid food - FL diet to start today  JAdrian Saran PharmD, BCPS Pager [phone]/10/2013 11:15 AM

## 2013-10-04 NOTE — Progress Notes (Signed)
PT Cancellation Note  Patient Details Name: MARSHAWN NINNEMAN MRN: [id number] DOB: [birth year]   Cancelled Treatment:    Reason Eval/Treat Not Completed:  (pt  states she has ambulated once today. will check back this PM)   Claretha Cooper 10/04/2013, 1:06 PM Tresa Endo PT [phone]

## 2013-10-04 NOTE — Evaluation (Signed)
Physical Therapy Evaluation Patient Details Name: Kelly [name] MRN: [id number] DOB: [birth year] Today's Date: 10/04/2013 Time: 7673-4193 PT Time Calculation (min): 24 min  PT Assessment / Plan / Recommendation History of Present Illness  a hx of stage 4 colon cancer currently on chemo who presents with persistent n/v. Pt was seen in ED on 1/21 for the same sx. Imaging at that time demonstrated nonspecific bowel gas patterns. The patient reportedly felt better but soon the sx continued and the patient was unable to tolerate regular PO. Thus far, only tolerating popsicles. Pt was seen in clinic today where she continued with intractable n/v and was referred for direct admission for further work up.  Clinical Impression  Pt is ambulating in hall with family and RW and supervision. Pt will benefit from PT to address problems listed. Pt may not require HHPT at DC. Will have 14/7 care. Spouse has been assisting with shower PTA.    PT Assessment  Patient needs continued PT services    Follow Up Recommendations  Home health PT (may not require by DC)    Does the patient have the potential to tolerate intense rehabilitation      Barriers to Discharge        Equipment Recommendations  None recommended by PT    Recommendations for Other Services     Frequency Min 3X/week    Precautions / Restrictions Precautions Precautions: Fall   Pertinent Vitals/Pain Having gas pain      Mobility  Bed Mobility Bed Mobility: Supine to Sit;Sit to Supine Supine to sit: Independent Sit to supine: Independent Transfers Overall transfer level: Needs assistance Equipment used: Rolling walker (2 wheeled) Transfers: Sit to/from Stand Sit to Stand: Supervision Ambulation/Gait Ambulation/Gait assistance: Supervision Ambulation Distance (Feet): 400 Feet Assistive device: Rolling walker (2 wheeled) Gait Pattern/deviations: Step-through pattern Gait velocity: gait is slow, steady. take some steps  without RW.    Exercises     PT Diagnosis: Difficulty walking;Generalized weakness  PT Problem List: Decreased strength;Decreased activity tolerance;Decreased balance;Decreased mobility;Decreased safety awareness;Decreased knowledge of use of DME PT Treatment Interventions: DME instruction;Functional mobility training;Therapeutic exercise;Therapeutic activities;Patient/family education     PT Goals(Current goals can be found in the care plan section) Acute Rehab PT Goals Patient Stated Goal: I want to go home soon PT Goal Formulation: With patient/family Time For Goal Achievement: 10/18/13 Potential to Achieve Goals: Good  Visit Information  Last PT Received On: 10/04/13 Assistance Needed: +1 Reason Eval/Treat Not Completed:  (pt  states she has ambulated once today. will check back this PM) History of Present Illness: a hx of stage 4 colon cancer currently on chemo who presents with persistent n/v. Pt was seen in ED on 1/21 for the same sx. Imaging at that time demonstrated nonspecific bowel gas patterns. The patient reportedly felt better but soon the sx continued and the patient was unable to tolerate regular PO. Thus far, only tolerating popsicles. Pt was seen in clinic today where she continued with intractable n/v and was referred for direct admission for further work up.       Prior [place] expects to be discharged to:: Private residence Living Arrangements: Spouse/significant other;Children Available Help at Discharge: Family Type of Home: House Home Access: Level entry [place]: One [place]: Environmental consultant - 2 wheels;Bedside commode;Shower seat;Wheelchair - manual Prior Function Level of Independence: Needs assistance ADL's / Homemaking Assistance Needed: spouse has been assisting in/out of tub    Cognition  Cognition Arousal/Alertness: Awake/alert  Behavior During Therapy: WFL for tasks assessed/performed Overall Cognitive  Status: Within Functional Limits for tasks assessed    Extremity/Trunk Assessment Upper Extremity Assessment Upper Extremity Assessment: Generalized weakness Lower Extremity Assessment Lower Extremity Assessment: Generalized weakness   Balance Balance Overall balance assessment: Needs assistance Sitting-balance support: No upper extremity supported Sitting balance-Leahy Scale: Good Sitting balance - Comments: some difficulty leaning forward for shoes. Standing balance support: No upper extremity supported Standing balance-Leahy Scale: Fair  End of Session PT - End of Session Activity Tolerance: Patient tolerated treatment well Patient left: in bed;with call bell/phone within reach;with family/visitor present Nurse Communication: Mobility status  GP     Claretha Cooper 10/04/2013, 3:28 PM Tresa Endo PT [phone]

## 2013-10-04 NOTE — Progress Notes (Signed)
Patient ID: Kelly [name], female   DOB: 13-Nov-1940, 73 y.o.   MRN: [id number]  CARE TEAM:  PCP: Ria Bush, MD  Outpatient Care Team: Patient Care Team: Ria Bush, MD as PCP - General (Family Medicine) Ladell Pier, MD as Consulting Physician (Oncology) Carola Frost, RN as Registered Nurse  Inpatient Treatment Team: Treatment Team: Attending Provider: Nolon Nations, MD; Consulting Physician: Md Edison Pace, MD; Consulting Physician: Ladell Pier, MD; Technician: Lily Peer, NT; Technician: Ileene Rubens, NT; Technician: Lennie Odor, NT; Registered Nurse: Harlene Ramus, RN; Technician: Tennis Ship, NT; Registered Nurse: Gwen Pounds, RN; Registered Nurse: Norva Pavlov, RN; Registered Nurse: Erick Blinks, RN; Registered Nurse: Lars Masson, RN; Registered Nurse: Stark Klein, RN; Physical Therapist: Claretha Cooper, PT; Technician: Shireen Quan, NT; Occupational Therapist: Jules Schick, OT   Subjective: Pt had 2 BMs yesterday, walking in hallways.  Continues to have gas pains, she asked Dr. Benay Spice and I to leave this morning.  I came back at a later time and her pain was better.  She denies nausea or vomiting.  VSS.  Husband is at bedside.    Objective:  Vital signs:  Filed Vitals:   10/04/13 0105 10/04/13 0140 10/04/13 0407 10/04/13 0604  BP:  119/50  111/52  Pulse:  93  93  Temp:  97.9 F (36.6 C)  98.2 F (36.8 C)  TempSrc:  Oral  Oral  Resp:  $Remo'16 16 18  'iQuvY$ Height:      Weight: 142 lb 1.6 oz (64.456 kg)     SpO2:  98% 100% 98%    Last BM Date: 10/03/13  Intake/Output   Yesterday:  02/02 0701 - 02/03 0700 In: 1250 [P.O.:120; I.V.:570; TPN:560] Out: 2100 [Urine:1650; Drains:450] This shift:  Total I/O In: 360 [P.O.:360] Out: -    Physical Exam: General: Pt awake/alert/oriented x3 in no acute distress Chest: CTA bilaterally.  No chest wall pain w good excursion CV:  Pulses  intact.  Regular rhythm MS: Normal AROM mjr joints.  No obvious deformity Abdomen: Soft.  Nondistended. Appropriately tender.  Midline dressing is c/d/i.  LUQ gastric tube.  No evidence of peritonitis.  No incarcerated hernias. Ext:  SCDs BLE.  No mjr edema.  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   Partial small bowel obstruction   N&V (nausea and vomiting)    Results:   Labs: Results for orders placed during the hospital encounter of 09/23/13 (from the past 48 hour(s))  GLUCOSE, CAPILLARY     Status: Abnormal   Collection Time    10/02/13 12:00 PM      Result Value Range   Glucose-Capillary 137 (*) 70 - 99 mg/dL  GLUCOSE, CAPILLARY     Status: Abnormal   Collection Time    10/02/13  5:04 PM      Result Value Range   Glucose-Capillary 129 (*) 70 - 99 mg/dL  URINALYSIS, ROUTINE W REFLEX MICROSCOPIC     Status: Abnormal   Collection Time    10/02/13  9:53 PM      Result Value Range   Color, Urine YELLOW  YELLOW   APPearance CLEAR  CLEAR   Specific Gravity, Urine 1.018  1.005 - 1.030   pH  6.0  5.0 - 8.0   Glucose, UA 100 (*) NEGATIVE mg/dL   Hgb urine dipstick LARGE (*) NEGATIVE   Bilirubin Urine NEGATIVE  NEGATIVE   Ketones, ur NEGATIVE  NEGATIVE mg/dL   Protein, ur NEGATIVE  NEGATIVE mg/dL   Urobilinogen, UA 0.2  0.0 - 1.0 mg/dL   Nitrite NEGATIVE  NEGATIVE   Leukocytes, UA NEGATIVE  NEGATIVE  URINE MICROSCOPIC-ADD ON     Status: Abnormal   Collection Time    10/02/13  9:53 PM      Result Value Range   Squamous Epithelial / LPF FEW (*) RARE   WBC, UA 0-2  <3 WBC/hpf   RBC / HPF 21-50  <3 RBC/hpf  GLUCOSE, CAPILLARY     Status: Abnormal   Collection Time    10/03/13 12:37 AM      Result Value Range   Glucose-Capillary 142 (*) 70 - 99 mg/dL   Comment 1  Notify RN    COMPREHENSIVE METABOLIC PANEL     Status: Abnormal   Collection Time    10/03/13  5:40 AM      Result Value Range   Sodium 136 (*) 137 - 147 mEq/L   Potassium 4.4  3.7 - 5.3 mEq/L   Chloride 100  96 - 112 mEq/L   CO2 26  19 - 32 mEq/L   Glucose, Bld 123 (*) 70 - 99 mg/dL   BUN 14  6 - 23 mg/dL   Creatinine, Ser 0.41 (*) 0.50 - 1.10 mg/dL   Calcium 8.2 (*) 8.4 - 10.5 mg/dL   Total Protein 5.9 (*) 6.0 - 8.3 g/dL   Albumin 1.8 (*) 3.5 - 5.2 g/dL   AST 38 (*) 0 - 37 U/L   ALT 32  0 - 35 U/L   Alkaline Phosphatase 145 (*) 39 - 117 U/L   Total Bilirubin 0.3  0.3 - 1.2 mg/dL   GFR calc non Af Amer >90  >90 mL/min   GFR calc Af Amer >90  >90 mL/min   Comment: (NOTE)     The eGFR has been calculated using the CKD EPI equation.     This calculation has not been validated in all clinical situations.     eGFR's persistently <90 mL/min signify possible Chronic Kidney     Disease.  MAGNESIUM     Status: None   Collection Time    10/03/13  5:40 AM      Result Value Range   Magnesium 2.1  1.5 - 2.5 mg/dL  PHOSPHORUS     Status: None   Collection Time    10/03/13  5:40 AM      Result Value Range   Phosphorus 3.4  2.3 - 4.6 mg/dL  CBC     Status: Abnormal   Collection Time    10/03/13  5:40 AM      Result Value Range   WBC 11.3 (*) 4.0 - 10.5 K/uL   RBC 3.19 (*) 3.87 - 5.11 MIL/uL   Hemoglobin 9.2 (*) 12.0 - 15.0 g/dL   HCT 29.6 (*) 36.0 - 46.0 %   MCV 92.8  78.0 - 100.0 fL   MCH 28.8  26.0 - 34.0 pg   MCHC 31.1  30.0 - 36.0 g/dL   RDW 18.1 (*) 11.5 - 15.5 %   Platelets 274  150 - 400 K/uL   Comment: REPEATED TO VERIFY     DELTA CHECK NOTED  DIFFERENTIAL     Status: Abnormal  Collection Time    10/03/13  5:40 AM      Result Value Range   Neutrophils Relative % 66  43 - 77 %   Lymphocytes Relative 18  12 - 46 %   Monocytes Relative 15 (*) 3 - 12 %   Eosinophils Relative 1  0 - 5 %   Basophils Relative 0  0 - 1 %   Neutro Abs 7.5  1.7 - 7.7 K/uL   Lymphs Abs 2.0   0.7 - 4.0 K/uL   Monocytes Absolute 1.7 (*) 0.1 - 1.0 K/uL   Eosinophils Absolute 0.1  0.0 - 0.7 K/uL   Basophils Absolute 0.0  0.0 - 0.1 K/uL   RBC Morphology POLYCHROMASIA PRESENT    TRIGLYCERIDES     Status: None   Collection Time    10/03/13  5:40 AM      Result Value Range   Triglycerides 127  <150 mg/dL   Comment: Performed at [facility]     Status: Abnormal   Collection Time    10/03/13  5:40 AM      Result Value Range   Prealbumin 8.7 (*) 17.0 - 34.0 mg/dL   Comment: Performed at [place], CAPILLARY     Status: Abnormal   Collection Time    10/03/13  5:58 AM      Result Value Range   Glucose-Capillary 123 (*) 70 - 99 mg/dL   Comment 1 Notify RN    GLUCOSE, CAPILLARY     Status: Abnormal   Collection Time    10/03/13 11:54 AM      Result Value Range   Glucose-Capillary 141 (*) 70 - 99 mg/dL  GLUCOSE, CAPILLARY     Status: Abnormal   Collection Time    10/03/13  4:58 PM      Result Value Range   Glucose-Capillary 120 (*) 70 - 99 mg/dL  GLUCOSE, CAPILLARY     Status: Abnormal   Collection Time    10/04/13 12:57 AM      Result Value Range   Glucose-Capillary 123 (*) 70 - 99 mg/dL   Comment 1 Notify RN    CREATININE, SERUM     Status: Abnormal   Collection Time    10/04/13  5:00 AM      Result Value Range   Creatinine, Ser 0.40 (*) 0.50 - 1.10 mg/dL   GFR calc non Af Amer >90  >90 mL/min   GFR calc Af Amer >90  >90 mL/min   Comment: (NOTE)     The eGFR has been calculated using the CKD EPI equation.     This calculation has not been validated in all clinical situations.     eGFR's persistently <90 mL/min signify possible Chronic Kidney     Disease.  GLUCOSE, CAPILLARY     Status: Abnormal   Collection Time    10/04/13  6:02 AM      Result Value Range   Glucose-Capillary 114 (*) 70 - 99 mg/dL   Comment 1 Notify RN      Imaging / Studies: No results found.  Medications / Allergies: per  chart  Antibiotics: Anti-infectives   Start     Dose/Rate Route Frequency Ordered Stop   09/28/13 1200  ertapenem (INVANZ) 1 g in sodium chloride 0.9 % 50 mL IVPB     1 g 100 mL/hr over 30 Minutes Intravenous Every 24 hours 09/28/13 1103 09/30/13 1206   09/27/13 0800  [  MAR Hold]  ertapenem (INVANZ) 1 g in sodium chloride 0.9 % 50 mL IVPB     (On MAR Hold since 09/27/13 1023)   1 g 100 mL/hr over 30 Minutes Intravenous On call to O.R. 09/27/13 0754 09/27/13 1202      Assessment/Plan  Stage 4 colon cancer  PCM  exploratory laparotomy with ileocolonic bypass and placement of g-tube.  POD#7 -start on full liquid diet with gastric tube clamped. If she tolerates it well, advance diet tomorrow.  Start calorie count tomorrow x24h to see her oral intake.  She may need intermittent bolus feeds, ensure, breeze. -wean off TPN when she is tolerating solids -mobilize, walking in hallways now  -encourage IS use  -continue PCA for today.  Start oral dilaudid(unable to tolerate hydrocodone, oxycodone) plan to stop PCA in AM and start IV dilaudid for breakthrough pain.  Give x1 dose of toradol. -decrease CBG monitoring  Erby Pian, [facility] Surgery Pager [phone] Office [phone]  10/04/2013 9:44 AM

## 2013-10-04 NOTE — Progress Notes (Signed)
Evidence of postoperative ileus for carcinomatosis obstruction beginning to resolve.  Gastric tube clamping trials with full liquid diet.  If improved, bowel regimen with gastrostomy tube care.

## 2013-10-05 LAB — GLUCOSE, CAPILLARY
GLUCOSE-CAPILLARY: 146 mg/dL — AB (ref 70–99)
Glucose-Capillary: 120 mg/dL — ABNORMAL HIGH (ref 70–99)

## 2013-10-05 LAB — BASIC METABOLIC PANEL
BUN: 16 mg/dL (ref 6–23)
CHLORIDE: 102 meq/L (ref 96–112)
CO2: 25 mEq/L (ref 19–32)
Calcium: 8 mg/dL — ABNORMAL LOW (ref 8.4–10.5)
Creatinine, Ser: 0.38 mg/dL — ABNORMAL LOW (ref 0.50–1.10)
GFR calc Af Amer: >90 mL/min (ref >90)
GFR calc non Af Amer: >90 mL/min (ref >90)
Glucose, Bld: 144 mg/dL — ABNORMAL HIGH (ref 70–99)
Potassium: 3.6 mEq/L — ABNORMAL LOW (ref 3.7–5.3)
SODIUM: 137 meq/L (ref 137–147)

## 2013-10-05 MED ORDER — SACCHAROMYCES BOULARDII 250 MG PO CAPS
250.0000 mg | ORAL_CAPSULE | Freq: Two times a day (BID) | ORAL | Status: DC
  Administered 2013-10-05 – 2013-10-06 (×2): 250 mg via ORAL
  Filled 2013-10-05 (×3): qty 1

## 2013-10-05 MED ORDER — PSYLLIUM 95 % PO PACK
1.0000 | PACK | Freq: Every day | ORAL | Status: DC
  Filled 2013-10-05 (×2): qty 1

## 2013-10-05 MED ORDER — HYDROMORPHONE HCL PF 1 MG/ML IJ SOLN: 0.5000 mg | INTRAMUSCULAR | Status: DC | PRN

## 2013-10-05 MED ORDER — POLYETHYLENE GLYCOL 3350 17 G PO PACK
17.0000 g | PACK | Freq: Every day | ORAL | Status: DC | PRN
  Filled 2013-10-05: qty 1

## 2013-10-05 MED ORDER — BOOST / RESOURCE BREEZE PO LIQD: 1.0000 | Freq: Three times a day (TID) | ORAL | Status: DC

## 2013-10-05 MED ORDER — HYDROMORPHONE HCL 2 MG PO TABS: 2.0000 mg | ORAL_TABLET | ORAL | Status: DC | PRN

## 2013-10-05 NOTE — Progress Notes (Signed)
PATIENT WANTS TO TALK WITH DOCTOR IN AM BEFORE SHE LETS NURSING STAFF REMOVE STAPLES, Kelly Maiorana, RN

## 2013-10-05 NOTE — Progress Notes (Signed)
NUTRITION FOLLOW UP/CALORIE COUNT  Intervention:   -Recommend addition of Resource Breeze supplement -May benefit from initiation of enteral feeds d/t continued poor PO intake. Will assess tolerance of supplement -If warranted, initiate Vital AF 1.2 @ 20 ml/hr via PEG and increase by 10 ml every 4 hours to goal rate of 45 ml/hr. At goal rate, tube feeding regimen will provide 1296 kcal, 81 grams of protein, and 876 ml of H2O.  -Will continue to monitor and modify to bolus feedings at d/c as warranted   Nutrition Dx:   Inadequate oral intake related to poor appetite and nause as evidenced by hx-ongoing   Goal:   Pt to meet >/= 90% of their estimated nutrition needs    Monitor:   Patient meet >/= 90% of their estimated nutrition needs    Assessment:   1/25: 73 yo F with stage 4 colon cancer and malignant partial SBO, hepatic metastases, peritoneal carcinomatosis. Patient has been receiving chemo and has been followed closely by the [place] [facility].  Patient meets criteria for Severe Malnutrition in the context of chronic illness AEB weight loss of 6% in the past month  and intake <75% for > 1 month.  1/28: -Pt s/p ex lap w/ileal-transverse and Gtube placement. Underwent procedure on 1/27. -Pharmacy noted pt with goal rate of Clinimix E5/20 @70  ml/hr + IVF 20% at 10 ml/hr to provide 1958 Kcal and 84 gram protein -Pt currently receiving Clinimix E5/20 at 40 mL/hr + 20% fat emulsion at 10 mL/hr and NS with 20 mEq KCl @ 60 mL/hr via peripheral access -Per pharmacy note, plan to increase Clinimix E 5/20 to 60 ml/hr. Will decrease IVF to 65ml/hr when TNA rate and monitor need for KCI -Hypoactive bowel sounds. Bowel function has not yet returned per MD -NGT removed on 1/27 -Phos, Mag and K all WNL. CBGs 136-180. -Will monitor need for tube feed via recently placed Gtube  2/02: -Pt receiving Clinimix E 5/20 at 70 ml/hr w/IV fat emulsion 20% at 10 ml/hr daily -This provides  1958 kcal/day and 84 gram protein, meeting 100% of estimated nutrition needs -Per discussion with RN, pt with BM today. G-tube in use for gravity, no plans for TF use at this time. Received Dulcolax on 2/01. -Pt on sips of clears diet. Has been drinking small amounts of water, orange juice and cranberry juice. Pt denied any nausea or stomach pains after beverage consumption. May benefit from tea and/or coffee consumption to assist in stimulating bowel movements -Pt reported being unable to tolerate Ensure/Boost like supplements pta. Has been able to consume [employer]. Will recommend to add as tolerated as this is only Day 2 of clear liquids. Encouraged use of ginger ale/sprite to increase supplements tolerability -Phos, Mag and K all WNL -Will check triglycerides on 2/05 per pharmacy note  2/04:  -Pt's TPN was reduced to 40 ml/hr and d/c'd at noon today. Pharmacy to d/c sliding scale and CBG's as pt w/out hx of DM. Triglycerides pending for 2/05 -Encouraged PO intake with pt and informed pt rationale of calorie count -Willing to try [employer] supplement tomorrow -Continues to have minimal appetite per Advice worker. Has 2-3 bites of food items -Possible need for TF if pt continues with current PO intake of <75% est nutrition needs  -May benefit from Vital AF 1.2 to transition to Osmolite 1.2 bolus feeds at d/c  Calorie Count Note  48 hour calorie count ordered.  Diet: Dys3 Supplements: N/A- Resource  Breeze to start tomorrow  Breakfast: 168 kcal, 5.3 gram protein Lunch: 145 kcal, 8.8 gram protein Dinner: Not ordered yet Supplements: N/A  Total intake today: 313 kcal (16% of minimum estimated needs)  14 gram protein (18% of minimum estimated needs)   Height: Ht Readings from Last 1 Encounters:  09/23/13 5\' 5"  (1.651 m)    Weight Status:   Wt Readings from Last 1 Encounters:  10/04/13 142 lb 1.6 oz (64.456 kg)    Re-estimated needs:  Kcal: 1900-2000  Protein:  80-90 gm protein  Fluid: 1.9L daily   Skin: WDL  Diet Order: Dysphagia 3   Intake/Output Summary (Last 24 hours) at 10/05/13 1457 Last data filed at 10/05/13 0709  Gross per 24 hour  Intake    240 ml  Output    650 ml  Net   -410 ml    Last BM: 2/02   Labs:   Recent Labs Lab 09/29/13 0550 10/01/13 0530 10/02/13 0637 10/03/13 0540 10/04/13 0500 10/05/13 0535  NA 134* 137 132* 136*  --  137  K 3.8 4.2 4.5 4.4  --  3.6*  CL 99 103 99 100  --  102  CO2 28 26 26 26   --  25  BUN 12 17 15 14   --  16  CREATININE 0.78 0.63 0.47* 0.41* 0.40* 0.38*  CALCIUM 8.1* 7.9* 7.9* 8.2*  --  8.0*  MG 1.8 2.0  --  2.1  --   --   PHOS 3.1 3.4  --  3.4  --   --   GLUCOSE 131* 123* 126* 123*  --  144*    CBG (last 3)   Recent Labs  10/04/13 1722 10/05/13 0006 10/05/13 0810  GLUCAP 113* 146* 120*    Scheduled Meds: . acetaminophen  1,000 mg Oral TID  . enoxaparin (LOVENOX) injection  40 mg Subcutaneous Q24H  . lip balm  1 application Topical BID  . loratadine  10 mg Oral Daily  . pantoprazole  40 mg Oral Daily  . sodium chloride  10-40 mL Intracatheter Q12H  . sodium chloride  10-40 mL Intracatheter Q12H    Continuous Infusions:    Atlee Abide MS RD LDN Clinical Dietitian [phone]

## 2013-10-05 NOTE — Progress Notes (Signed)
Per pharmacy orders at 0930 pt. TPN was reduced from 70cc/hr to 40cc/hr then was cut off at 1200.  Encouraged pt. To order lunch. Will continue to monitor.

## 2013-10-05 NOTE — Progress Notes (Signed)
I suspect her "diarrhea" is postobstructive diarrhea.  I doubt true C. Difficile.  However, IF it has classic smell and does not resolve in the next 24 hours, could reconsider.  Place on probiotic & gentle fiber regimen.  Perhaps MiraLax too strong

## 2013-10-05 NOTE — Progress Notes (Signed)
Patient ID: Kelly [name], female   DOB: 1941-05-23, 73 y.o.   MRN: [id number]  CARE TEAM:  PCP: Ria Bush, MD  Outpatient Care Team: Patient Care Team: Ria Bush, MD as PCP - General (Family Medicine) Ladell Pier, MD as Consulting Physician (Oncology) Carola Frost, RN as Registered Nurse  Inpatient Treatment Team: Treatment Team: Attending Provider: Nolon Nations, MD; Consulting Physician: Md Edison Pace, MD; Consulting Physician: Ladell Pier, MD; Technician: Lily Peer, NT; Technician: Ileene Rubens, NT; Registered Nurse: Harlene Ramus, RN; Technician: Tennis Ship, NT; Registered Nurse: Gwen Pounds, RN; Registered Nurse: Norva Pavlov, RN; Registered Nurse: Erick Blinks, RN; Registered Nurse: Lars Masson, RN; Occupational Therapist: Lesle Chris, OT   Subjective: Now complaining of diarrhea, had 6 loose BMs yesterday.  Poor appetite, feels "queezy" denies cp, sob. Palpitations.  Ambulating in hallways, working with PT/OT.    Objective:  Vital signs:  Filed Vitals:   10/04/13 2000 10/04/13 2052 10/05/13 0005 10/05/13 0709  BP:  130/55  129/62  Pulse:  104  106  Temp:  98.3 F (36.8 C)  98 F (36.7 C)  TempSrc:  Oral  Oral  Resp: _0 Height:      Weight:      SpO2: 99% 98% 98% 100%    Last BM Date: 10/04/13  Intake/Output   Yesterday:  02/03 0701 - 02/04 0700 In: 1360 [P.O.:720; TPN:640] Out: 1051 [Urine:850; Drains:200; Stool:1] This shift:  Total I/O In: -  Out: 100 [Urine:100]  Physical Exam:  General: Pt awake/alert/oriented x3 in no acute distress  Chest: CTA bilaterally. No chest wall pain w good excursion  CV: Pulses intact. Regular rhythm  MS: Normal AROM mjr joints. No obvious deformity  Abdomen: Soft. Nondistended. Appropriately tender. Midline incision with approximated edges, no erythema, staples are intact, dressing changed. LUQ gastric tube clamped. No evidence of peritonitis. No  incarcerated hernias.  Ext: SCDs BLE. No mjr edema. 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   Partial small bowel obstruction   N&V (nausea and vomiting)    Results:   Labs: Results for orders placed during the hospital encounter of 09/23/13 (from the past 48 hour(s))  GLUCOSE, CAPILLARY     Status: Abnormal   Collection Time    10/03/13 11:54 AM      Result Value Range   Glucose-Capillary 141 (*) 70 - 99 mg/dL  GLUCOSE, CAPILLARY     Status: Abnormal   Collection Time    10/03/13  4:58 PM      Result Value Range   Glucose-Capillary 120 (*) 70 - 99 mg/dL  GLUCOSE, CAPILLARY     Status: Abnormal   Collection Time    10/04/13 12:57 AM      Result Value Range   Glucose-Capillary 123 (*) 70 - 99 mg/dL   Comment 1 Notify RN    CREATININE, SERUM     Status: Abnormal   Collection Time    10/04/13  5:00 AM      Result Value Range   Creatinine, Ser 0.40 (*) 0.50 - 1.10 mg/dL   GFR calc non Af Amer >90  >90 mL/min   GFR calc Af Amer >90  >90 mL/min   Comment: (NOTE)  The eGFR has been calculated using the CKD EPI equation.     This calculation has not been validated in all clinical situations.     eGFR's persistently <90 mL/min signify possible Chronic Kidney     Disease.  GLUCOSE, CAPILLARY     Status: Abnormal   Collection Time    10/04/13  6:02 AM      Result Value Range   Glucose-Capillary 114 (*) 70 - 99 mg/dL   Comment 1 Notify RN    GLUCOSE, CAPILLARY     Status: Abnormal   Collection Time    10/04/13 11:55 AM      Result Value Range   Glucose-Capillary 148 (*) 70 - 99 mg/dL   Comment 1 Notify RN    GLUCOSE, CAPILLARY     Status: Abnormal   Collection Time    10/04/13  5:22 PM      Result Value Range   Glucose-Capillary 113 (*) 70 - 99 mg/dL   Comment  1 Documented in Chart     Comment 2 Notify RN    GLUCOSE, CAPILLARY     Status: Abnormal   Collection Time    10/05/13 12:06 AM      Result Value Range   Glucose-Capillary 146 (*) 70 - 99 mg/dL   Comment 1 Notify RN    BASIC METABOLIC PANEL     Status: Abnormal   Collection Time    10/05/13  5:35 AM      Result Value Range   Sodium 137  137 - 147 mEq/L   Potassium 3.6 (*) 3.7 - 5.3 mEq/L   Comment: DELTA CHECK NOTED     REPEATED TO VERIFY   Chloride 102  96 - 112 mEq/L   CO2 25  19 - 32 mEq/L   Glucose, Bld 144 (*) 70 - 99 mg/dL   BUN 16  6 - 23 mg/dL   Creatinine, Ser 0.38 (*) 0.50 - 1.10 mg/dL   Calcium 8.0 (*) 8.4 - 10.5 mg/dL   GFR calc non Af Amer >90  >90 mL/min   GFR calc Af Amer >90  >90 mL/min   Comment: (NOTE)     The eGFR has been calculated using the CKD EPI equation.     This calculation has not been validated in all clinical situations.     eGFR's persistently <90 mL/min signify possible Chronic Kidney     Disease.  GLUCOSE, CAPILLARY     Status: Abnormal   Collection Time    10/05/13  8:10 AM      Result Value Range   Glucose-Capillary 120 (*) 70 - 99 mg/dL    Imaging / Studies: No results found.  Medications / Allergies: per chart  Antibiotics: Anti-infectives   Start     Dose/Rate Route Frequency Ordered Stop   09/28/13 1200  ertapenem (INVANZ) 1 g in sodium chloride 0.9 % 50 mL IVPB     1 g 100 mL/hr over 30 Minutes Intravenous Every 24 hours 09/28/13 1103 09/30/13 1206   09/27/13 0800  [MAR Hold]  ertapenem (INVANZ) 1 g in sodium chloride 0.9 % 50 mL IVPB     (On MAR Hold since 09/27/13 1023)   1 g 100 mL/hr over 30 Minutes Intravenous On call to O.R. 09/27/13 0754 09/27/13 1202      Assessment/Plan  Stage 4 colon cancer  PCM  exploratory laparotomy with ileocolonic bypass and placement of g-tube.  POD#8  -advance to soft diet, start calorie count x48  hours. Wean off TPN.  If she does not meet her daily caloric intake, consider bolus  TF/ensure. -she is now having diarrhea, change miralax to daily PRN.  If diarrhea persists, obtain a stool for c. Diff. -mobilize, walking in hallways now -encourage IS use  -staples will need to be removed in a few days -DC PCA, c/w scheduled tylenol, PRN tramadol, IV dilaudid for breakthrough pain.   -dispo: PT/OT recommend discharge home with supervision.  The patient has a husband and 2 daughters who are former nurses who have agreed to provide assistance. Discharge pending improved management of pain and nutritional evaluation  Erby Pian, [facility] Surgery Pager [phone] Office [phone]  10/05/2013 8:53 AM

## 2013-10-05 NOTE — Care Management Note (Signed)
Cm spoke with patient at bedside to offer choice for [facility] services. Pt provided Cm permission to coordinate the arrangement of Home Health with daughter Colletta Maryland at [phone]. Per pt's daughter disposition is home with [facility] services. Pt's daughter offered choice for [facility]. Per pt's daughter will discuss list with sister Arby Barrette. Pt's daughter also requested home hospice choice list. Per daughter pt has Rw and shower chair. Pt's daughter request MD write rx for tub transfer bench. Awaiting MD orders for RN/PT/OT.      Venita Lick Colt Martelle,MSN,RN [phone]

## 2013-10-06 ENCOUNTER — Encounter: Payer: Self-pay | Admitting: Oncology

## 2013-10-06 LAB — TRIGLYCERIDES: TRIGLYCERIDES: 149 mg/dL (ref <150)

## 2013-10-06 MED ORDER — TRAMADOL HCL 50 MG PO TABS: 50.0000 mg | ORAL_TABLET | Freq: Four times a day (QID) | ORAL | Status: AC | PRN

## 2013-10-06 MED ORDER — ENSURE COMPLETE PO LIQD: 237.0000 mL | Freq: Four times a day (QID) | ORAL | Status: DC | PRN

## 2013-10-06 MED ORDER — HEPARIN SOD (PORK) LOCK FLUSH 100 UNIT/ML IV SOLN
500.0000 [IU] | INTRAVENOUS | Status: DC | PRN
  Filled 2013-10-06: qty 5

## 2013-10-06 MED ORDER — BOOST / RESOURCE BREEZE PO LIQD: 1.0000 | Freq: Three times a day (TID) | ORAL | Status: DC

## 2013-10-06 MED ORDER — ALPRAZOLAM 0.25 MG PO TABS: 0.1250 mg | ORAL_TABLET | Freq: Three times a day (TID) | ORAL | Status: DC | PRN

## 2013-10-06 MED ORDER — PROMETHAZINE HCL 25 MG PO TABS: 25.0000 mg | ORAL_TABLET | Freq: Four times a day (QID) | ORAL | Status: DC | PRN

## 2013-10-06 MED ORDER — HYDROMORPHONE HCL 2 MG PO TABS: 2.0000 mg | ORAL_TABLET | Freq: Four times a day (QID) | ORAL | Status: AC | PRN

## 2013-10-06 MED ORDER — ONDANSETRON 4 MG PO TBDP: 4.0000 mg | ORAL_TABLET | Freq: Three times a day (TID) | ORAL | Status: AC | PRN

## 2013-10-06 MED ORDER — SACCHAROMYCES BOULARDII 250 MG PO CAPS: 250.0000 mg | ORAL_CAPSULE | Freq: Two times a day (BID) | ORAL | Status: DC

## 2013-10-06 NOTE — Progress Notes (Signed)
Pt. Was discharged home.  She was given her discharge instructions, prescriptions, and all questions were answered. Her port was flushed per protocol and deaccessed.  She was transported home by family.

## 2013-10-06 NOTE — Progress Notes (Signed)
OT Cancellation Note  Patient Details Name: Kelly [name] MRN: [id number] DOB: Sep 15, 1940   Cancelled Treatment:    Reason Eval/Treat Not Completed: Other (comment) Family in room request to let [name] assess for tubbench need versus if the tubseat they own will be sufficient. They state she is getting up and doing ADL in the room ok and husband can assist at d/c. Recommend [name] for HHOT to assess tub transfer safety/DME.  [place], [place] 10/06/2013, 10:15 AM

## 2013-10-06 NOTE — Discharge Summary (Signed)
Physician Discharge Summary  Patient ID: Kelly [name] MRN: [id number] DOB/AGE: 1940/09/23 73 y.o.  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  Nausea and vomiting History of TAH and BSO  History ruptured endometrioma, were resected through right lower quadrant scar  History PACs  Remote history of rheumatic fever with no known valvular disease   Discharge Diagnosis Patient Active Problem List   Diagnosis Date Noted  . Peritoneal carcinomatosis 10/03/2013  . N&V (nausea and vomiting) 09/23/2013  . Partial small bowel obstruction 09/15/2013  . Bilateral lower abdominal discomfort 05/31/2013  . 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 RLQ abdominal pain 02/14/2013  . Vitamin D deficiency 02/17/2012  . ASTHMA 04/17/2009  . ANXIETY DEPRESSION 02/09/2009  . OSTEOARTHRITIS 02/09/2009  . HYPERLIPIDEMIA 01/18/2008  . PALPITATIONS, OCCASIONAL 01/18/2008  . INSOMNIA, CHRONIC 01/11/2008  . OSTEOPENIA 01/11/2008    Consultants Dr. Benay Spice (Heme-oncology)  Imaging: No results found.  Procedures Dr. Barry Dienes (09/27/13) - Exploratory laparotomy with ileocolostomy (bypass of RLQ mass) and stamm gastrostomy tube.    Hospital Course:  73 y.o. female. She was referred by Dr. Julieanne Manson for a surgical oncology opinion regarding stage IV colon cancer with probable, intermittent, low-grade, malignant small bowel obstruction. She is [name]'s mother-in-law. [name] is her cardiologist. [name] is her gastroenterologist. [name] is her PCP.   This past summer she began complaining of intermittent suprapubic discomfort without any other significant symptoms other than occasional alternating diarrhea and constipation. She was initially diagnosed with a urinary tract infection but she continued to have  discomfort and began developing early satiety, decreased food intake and some weight loss. On 06/02/2013 she underwent screening colonoscopy which revealed a 6 x 6 cm neoplastic mass in the right colon near the ileocecal valve. The anatomy of the cecum, appendiceal orifice, and ileocecal valve were actually distorted and not well seen. Biopsy showed adenocarcinoma. Subsequent CT scan showed numerous bilobar hepatic metastasis. There were also some peritoneal and omental metastasis and thickening of the cecum consistent with a cecal mass.  She completed 4 cycles of FOLFOX/Avastin on 08/03/2013. She began having intermittent episodes of vomiting in December. She continued to have bowel movements. She stated that she did not have any pain but has very loud rumbling bowel sounds. She had an allergic reaction to oxaliplatin after cycle 4. She developed the obstructive symptoms shortly thereafter.   A followup CT scan was performed on 08/18/2013. This shows mild improvement of hepatic metastasis. Slight improvement in peritoneal and omental metastasis. The dominant mass off the inferior aspect of the cecum was slightly smaller. There was a serosal metastasis in the right mid abdomen somewhat near the cecum. There is mild mid ileal dilatation with a decompressed terminal ileal loop was in the region of the serosal disease, suggesting a partial SBO. Contrast immediately went through into the colon.   Avastin has been held. Chemotherapy was changed to FOLFIRI (irinotecan). She is taking Senokot, 4 tablets per day. She has seen a nutritionist and is staying on a low fiber diet. For the past 2 weeks she has felt much better. She is tolerating a diet, although still has early satiety is trying to drink more water and follow 6 small feedings per day. She denies pain nausea or vomiting. She has lost 30 pounds in the past 6 months. Recent albumin is 3.2.LFTs  are normal.  Comorbidities include PACs, followed by [name].  History of rheumatic fever as a child. GERD. History TAH and BSO. History ruptured endometrioma with laparotomy for that. She was seen in the office last week by Dr Dalbert Batman. She was feeling better at that point on a low residue diet and it was decided not to intervene surgically. Since then, she has began to have vomiting again and was admitted to the hospital.  Patient was admitted and treated conservatively; however he obstruction would not resolve so it was decided to intervene surgically.  She underwent procedure listed above as part as a palliative approach to bypass the obstruction.  A G-tube as also placed to be supportive for TF or as gastric decompression if N/V were to return.  Tolerated procedure well and was transferred to the floor.  Over the next several days her ileus resolved and her diet was advanced as tolerated.  On POD #8, the patient was voiding well, tolerating small amounts of food, ambulating well, pain well controlled, vital signs stable, incisions c/d/i and felt stable for discharge home.  She is currently unable to take in adequate PO due to anorexia, thus dietitian was consulted.  They recommended as well as we recommend ensure vs boost supplementation PO or through the G tube if unable to take PO.  She will have [place] who will help to facilitate the bolus tube feeds with the family.  HH PT/OT will also come out to see the patient.  Patient will follow up in our office in 3-5 days for staple removal and in 2-3 weeks for a post-op check and knows to call with questions or concerns.     Physical Exam: General:  Alert, NAD, pleasant, comfortable Abd:  Soft, ND, mild tenderness, incisions C/D/I, staples in place, G tube in place in LUQ     Medication List         ALPRAZolam 0.25 MG tablet  Commonly known as:  XANAX  Take 0.5 tablets (0.125 mg total) by mouth 2 (two) times daily as needed for anxiety.     ALPRAZolam 0.25 MG tablet  Commonly known as:  XANAX  Take 0.5  tablets (0.125 mg total) by mouth 3 (three) times daily as needed for anxiety.     CLARITIN 10 MG tablet  Generic drug:  loratadine  Take 10 mg by mouth daily as needed for allergies.     feeding supplement (RESOURCE BREEZE) Liqd  Take 1 Container by mouth 3 (three) times daily with meals.     feeding supplement (ENSURE COMPLETE) Liqd  Place 237 mLs into feeding tube 4 (four) times daily as needed (nutrition supplementation through G tube as bolus feeds or PO).     glycerin (Pediatric) 1.2 G Supp  Place 1 suppository rectally daily as needed for mild constipation.     HYDROmorphone 2 MG tablet  Commonly known as:  DILAUDID  Take 1-4 tablets (2-8 mg total) by mouth every 6 (six) hours as needed (breakthrough pain only).     hyoscyamine 0.125 MG SL tablet  Commonly known as:  LEVSIN SL  place one tablet under the tongue every 6 hours as needed for cramping     lidocaine-prilocaine cream  Commonly known as:  EMLA  Apply topically to port a cath site one hour prior to use. Do not rub in. Cover with plastic.     mirtazapine 15 MG tablet  Commonly known as:  REMERON  Take 1 tablet (15 mg  total) by mouth at bedtime.     omeprazole 20 MG capsule  Commonly known as:  PRILOSEC  Take 20 mg by mouth 2 (two) times daily.     ondansetron 4 MG disintegrating tablet  Commonly known as:  ZOFRAN ODT  Take 1 tablet (4 mg total) by mouth every 8 (eight) hours as needed for nausea or vomiting.     polyethylene glycol packet  Commonly known as:  MIRALAX / GLYCOLAX  Take 17 g by mouth daily as needed for mild constipation.     potassium chloride SA 20 MEQ tablet  Commonly known as:  K-DUR,KLOR-CON  Take 1 tablet (20 mEq total) by mouth daily. Take twice daily on 06/29/13, then one by mouth daily     PRESCRIPTION MEDICATION  See admin instructions. Chemotherapy at [facility] (Dr. Benay Spice), FOLFIRI regimen, last treatment 09/14/2013     prochlorperazine 10 MG tablet  Commonly known as:  COMPAZINE   Take 10 mg by mouth every 6 (six) hours as needed for nausea or vomiting.     promethazine 25 MG tablet  Commonly known as:  PHENERGAN  Take 1 tablet (25 mg total) by mouth every 6 (six) hours as needed for nausea.     saccharomyces boulardii 250 MG capsule  Commonly known as:  FLORASTOR  Take 1 capsule (250 mg total) by mouth 2 (two) times daily.     sennosides-docusate sodium 8.6-50 MG tablet  Commonly known as:  SENOKOT-S  Take 1 tablet by mouth 2 (two) times daily as needed for constipation.     traMADol 50 MG tablet  Commonly known as:  ULTRAM  Take 1-2 tablets (50-100 mg total) by mouth every 6 (six) hours as needed.     zolpidem 10 MG tablet  Commonly known as:  AMBIEN  Take 10 mg by mouth at bedtime as needed for sleep.        Follow-up Information   Follow up with [facility], MD. Schedule an appointment as soon as possible for a visit in 2 weeks. (for a post op check)    Specialty:  General Surgery   Contact information:   [place] [address] [phone]       Follow up with [facility],MD, MD In 4 days. (Make an appointment in 3-5 days for staple removal in the office with Dr. Johney [place]' nurse)    Specialty:  General Surgery   Contact information:   [place] [address] [phone]       Schedule an appointment as soon as possible for a visit with Betsy Coder, MD.   Specialty:  Oncology   Contact information:   [place] [address] [phone]       Schedule an appointment as soon as possible for a visit with Ria Bush, MD. (As needed)    Specialty:  Family Medicine   Contact information:   [place] [address] [phone]        Signed: Excell Seltzer [facility] [phone]  10/06/2013, 12:32 PM

## 2013-10-06 NOTE — Discharge Instructions (Signed)
[facility]      [facility] °[phone] ° °OPEN ABDOMINAL SURGERY: POST OP INSTRUCTIONS ° °Always review your discharge instruction sheet given to you by the facility where your surgery was performed. ° °IF YOU HAVE DISABILITY OR FAMILY LEAVE FORMS, YOU MUST BRING THEM TO THE OFFICE FOR PROCESSING.  PLEASE DO NOT GIVE THEM TO YOUR DOCTOR. ° °1. A prescription for pain medication may be given to you upon discharge.  Take your pain medication as prescribed, if needed.  If narcotic pain medicine is not needed, then you may take acetaminophen (Tylenol) or ibuprofen (Advil) as needed. °2. Take your usually prescribed medications unless otherwise directed. °3. If you need a refill on your pain medication, please contact your pharmacy. They will contact our office to request authorization.  Prescriptions will not be filled after 5pm or on week-ends. °4. You should follow a light diet the first few days after arrival home, such as soup and crackers, pudding, etc.unless your doctor has advised otherwise. A high-fiber, low fat diet can be resumed as tolerated.   Be sure to include lots of fluids daily. Most patients will experience some swelling and bruising on the chest and neck area.  Ice packs will help.  Swelling and bruising can take several days to resolve °5. Most patients will experience some swelling and bruising in the area of the incision. Ice pack will help. Swelling and bruising can take several days to resolve..  °6. It is common to experience some constipation if taking pain medication after surgery.  Increasing fluid intake and taking a stool softener will usually help or prevent this problem from occurring.  A mild laxative (Milk of Magnesia or Miralax) should be taken according to package directions if there are no bowel movements after 48 hours. °7.  You may have steri-strips (small skin tapes) in place directly over the incision.  These strips should be left on the skin for 7-10 days.  If your  surgeon used skin glue on the incision, you may shower in 24 hours.  The glue will flake off over the next 2-3 weeks.  Any sutures or staples will be removed at the office during your follow-up visit. You may find that a light gauze bandage over your incision may keep your staples from being rubbed or pulled. You may shower and replace the bandage daily. °8. ACTIVITIES:  You may resume regular (light) daily activities beginning the next day--such as daily self-care, walking, climbing stairs--gradually increasing activities as tolerated.  You may have sexual intercourse when it is comfortable.  Refrain from any heavy lifting or straining until approved by your doctor. °a. You may drive when you no longer are taking prescription pain medication, you can comfortably wear a seatbelt, and you can safely maneuver your car and apply brakes °b. Return to Work: ___________________________________ °9. You should see your doctor in the office for a follow-up appointment approximately two weeks after your surgery.  Make sure that you call for this appointment within a day or two after you arrive home to insure a convenient appointment time. °OTHER INSTRUCTIONS:  °_____________________________________________________________ °_____________________________________________________________ ° °WHEN TO CALL YOUR DOCTOR: °1. Fever over 101.0 °2. Inability to urinate °3. Nausea and/or vomiting °4. Extreme swelling or bruising °5. Continued bleeding from incision. °6. Increased pain, redness, or drainage from the incision. °7. Difficulty swallowing or breathing °8. Muscle cramping or spasms. °9. Numbness or tingling in hands or feet or around lips. ° °The clinic staff is available to   answer your questions during regular business hours.  Please dont hesitate to call and ask to speak to one of the nurses if you have concerns.  For further questions, please visit [email or web address]  Gastric tube Home Care You had a  gastric tube put in your stomach. Do not put anything in your feeding tube until you have been shown how to use it. HOME CARE For the first 24 hours:  Do not sign any important or legal papers.  Do not drive. Every day:  Check the place where the tube goes into your belly. See your doctor if the tube site is red, puffy (swollen), or smells bad.  Clean around the tube with soap and water. Dry the area by patting it gently with a clean towel.  Do not put a bandage around the tube site. Keep the area dry.  You should turn the bumper on the outside of your belly. The bumper should lie flat against your skin so you are comfortable.  Do not put whole pills through the tube. Instead, crush and dissolve all pills in warm water before placing down tube. GET HELP RIGHT AWAY IF:  You throw up (vomit).  The pain in your belly gets worse.  You have trouble breathing.  You have a temperature by mouth above 102 F (38.9 C), not controlled by medicine.  You have pain when you swallow.  You have chest pain.  There is green or yellow fluid (drainage) around the tube site that smells bad.  There is redness and puffiness around the tube site.  The tube comes out. MAKE SURE YOU:  Understand these instructions.  Will watch your condition.  Will get help right away if you are not doing well or get worse. Document Released: 09/20/2010 Document Revised: 04/20/2013 Document Reviewed: 02/17/2013 [facility] Patient Information 2014 [place], [place].

## 2013-10-06 NOTE — Progress Notes (Signed)
Calorie Count Note  Per discussion with RN, family has requested calorie count be discontinued as they would not like to utilize tube feeding to assist in meeting pt's estimated nutrition needs.  Recommend to continue to encourage PO and supplement intake  Will continue to monitor. Please re-consult as needed   Emanuel [place] Clinical Dietitian [phone]

## 2013-10-06 NOTE — Progress Notes (Signed)
Nutrition Services  Received consult from case management concerning pt's d/c needs for tube feeding. Family has opted to initiate small bolus feeds to assist in meeting pt's estimated nutrition needs.   INTERVENTION:  -Initiate Ensure Complete @ 60 ml via PEG tube and increase by 93ml every 2 hours to goal rate of 240 ml bolus feeds QID.  At goal rate, tube feeding regimen will provide 1400 kcal, 52 grams of protein, and 720 ml of H2O.  -Pt will require additional 1200 ml fluid. Recommend 150 ml flush before and 150 ml flush after bolus feed; this equals 300 ml flush QID. -This will meet 74% est kcal needs, and 65% est protein needs -Continue to encourage PO and fluid intake  Estimated needs: Kcal: 1900-2000  Protein: 80-90 gm protein  Fluid: 1.9L daily    Atlee Abide Robinson RD LDN Clinical Dietitian [phone]

## 2013-10-06 NOTE — Care Management Note (Signed)
Cm spoke with patient, spouse, and adult daughter at the bedside. Pt states "I want to go home". Cm spoke with NP Megan with [facility] concerning MD orders for home health to include RN/PT/OT. Per pt choice [facility] to provide Hh services upon discharge. [facility] rep Colletta [place] made aware. Start of care anticipated 10/07/13. Pt states has shower chair. Will await PT in home safety eval before ordering more DME. Pt's spouse and two adult daughter to assist in home care.     Venita Lick Lynnley Doddridge,MSN,RN [phone]

## 2013-10-06 NOTE — Progress Notes (Signed)
Faxed daughter's fmla form to [employer] @ [phone] °

## 2013-10-06 NOTE — Discharge Summary (Signed)
Bowels moving better.  Consider 1-3 extra cans of Ensure a day boluses via the G-tube until eating better to avoid malnutrition.  Daughter very worried this is being too aggressive but afraid to discuss hospice either.  Challenging situation.  We will defer to Dr. Benay Spice on plans of being how aggressive.

## 2013-10-07 ENCOUNTER — Telehealth (INDEPENDENT_AMBULATORY_CARE_PROVIDER_SITE_OTHER): Payer: Self-pay

## 2013-10-07 NOTE — Telephone Encounter (Signed)
Pt's husband calling to make nurse only visit for 10/12/13 since the pt has an appt already in place with Dr Benay Spice on 10/12/13. I advised pt that they have her scheduled with Dr Benay Spice from 8:45 to 3:15 over at the cancer c[facility] b/c of labs, appt, and chemo. The pt stated that Dr Benay Spice stated the pt did not have to have labs and chemo just the appt only with him so they could have discussion about her options with chemo.  The pt would like for you to call Dr Gearldine Shown office to cancel the labs and chemo for her please. I made the nurse only visit for 1:30 to have staples removed. The pt will have a f/u appt with Dr Barry Dienes on 10/24/13. Please call.

## 2013-10-10 ENCOUNTER — Encounter (HOSPITAL_COMMUNITY): Payer: Self-pay | Admitting: Emergency Medicine

## 2013-10-10 ENCOUNTER — Inpatient Hospital Stay (HOSPITAL_COMMUNITY)
Admission: EM | Admit: 2013-10-10 | Discharge: 2013-10-11 | DRG: 299 | Disposition: A | Discharge status: Home Health | Payer: Medicare Other | Attending: Internal Medicine | Admitting: Internal Medicine

## 2013-10-10 DIAGNOSIS — C787 Secondary malignant neoplasm of liver and intrahepatic bile duct: Secondary | ICD-10-CM | POA: Diagnosis present

## 2013-10-10 DIAGNOSIS — E785 Hyperlipidemia, unspecified: Secondary | ICD-10-CM | POA: Diagnosis present

## 2013-10-10 DIAGNOSIS — D630 Anemia in neoplastic disease: Secondary | ICD-10-CM | POA: Diagnosis present

## 2013-10-10 DIAGNOSIS — R1032 Left lower quadrant pain: Secondary | ICD-10-CM

## 2013-10-10 DIAGNOSIS — M79609 Pain in unspecified limb: Secondary | ICD-10-CM

## 2013-10-10 DIAGNOSIS — C762 Malignant neoplasm of abdomen: Secondary | ICD-10-CM | POA: Diagnosis present

## 2013-10-10 DIAGNOSIS — C189 Malignant neoplasm of colon, unspecified: Secondary | ICD-10-CM | POA: Diagnosis present

## 2013-10-10 DIAGNOSIS — Z85038 Personal history of other malignant neoplasm of large intestine: Secondary | ICD-10-CM

## 2013-10-10 DIAGNOSIS — Z931 Gastrostomy status: Secondary | ICD-10-CM

## 2013-10-10 DIAGNOSIS — K219 Gastro-esophageal reflux disease without esophagitis: Secondary | ICD-10-CM | POA: Diagnosis present

## 2013-10-10 DIAGNOSIS — D6481 Anemia due to antineoplastic chemotherapy: Secondary | ICD-10-CM | POA: Diagnosis present

## 2013-10-10 DIAGNOSIS — I824Z9 Acute embolism and thrombosis of unspecified deep veins of unspecified distal lower extremity: Principal | ICD-10-CM | POA: Diagnosis present

## 2013-10-10 DIAGNOSIS — E876 Hypokalemia: Secondary | ICD-10-CM | POA: Diagnosis present

## 2013-10-10 DIAGNOSIS — J45909 Unspecified asthma, uncomplicated: Secondary | ICD-10-CM | POA: Diagnosis present

## 2013-10-10 DIAGNOSIS — G4700 Insomnia, unspecified: Secondary | ICD-10-CM | POA: Diagnosis present

## 2013-10-10 DIAGNOSIS — E43 Unspecified severe protein-calorie malnutrition: Secondary | ICD-10-CM | POA: Diagnosis present

## 2013-10-10 DIAGNOSIS — F329 Major depressive disorder, single episode, unspecified: Secondary | ICD-10-CM | POA: Diagnosis present

## 2013-10-10 DIAGNOSIS — Z803 Family history of malignant neoplasm of breast: Secondary | ICD-10-CM

## 2013-10-10 DIAGNOSIS — Z8249 Family history of ischemic heart disease and other diseases of the circulatory system: Secondary | ICD-10-CM

## 2013-10-10 DIAGNOSIS — C801 Malignant (primary) neoplasm, unspecified: Secondary | ICD-10-CM

## 2013-10-10 DIAGNOSIS — F3289 Other specified depressive episodes: Secondary | ICD-10-CM | POA: Diagnosis present

## 2013-10-10 DIAGNOSIS — M899 Disorder of bone, unspecified: Secondary | ICD-10-CM

## 2013-10-10 DIAGNOSIS — Z79899 Other long term (current) drug therapy: Secondary | ICD-10-CM

## 2013-10-10 DIAGNOSIS — C786 Secondary malignant neoplasm of retroperitoneum and peritoneum: Secondary | ICD-10-CM | POA: Diagnosis present

## 2013-10-10 DIAGNOSIS — Z85048 Personal history of other malignant neoplasm of rectum, rectosigmoid junction, and anus: Secondary | ICD-10-CM

## 2013-10-10 DIAGNOSIS — T451X5A Adverse effect of antineoplastic and immunosuppressive drugs, initial encounter: Secondary | ICD-10-CM | POA: Diagnosis present

## 2013-10-10 DIAGNOSIS — R1031 Right lower quadrant pain: Secondary | ICD-10-CM

## 2013-10-10 DIAGNOSIS — I82409 Acute embolism and thrombosis of unspecified deep veins of unspecified lower extremity: Secondary | ICD-10-CM | POA: Diagnosis present

## 2013-10-10 DIAGNOSIS — M949 Disorder of cartilage, unspecified: Secondary | ICD-10-CM

## 2013-10-10 LAB — CBC WITH DIFFERENTIAL/PLATELET
Basophils Absolute: 0 K/uL (ref 0.0–0.1)
Basophils Relative: 0 % (ref 0–1)
Eosinophils Absolute: 0 K/uL (ref 0.0–0.7)
Eosinophils Relative: 0 % (ref 0–5)
HCT: 32 % — ABNORMAL LOW (ref 36.0–46.0)
Hemoglobin: 9.9 g/dL — ABNORMAL LOW (ref 12.0–15.0)
Lymphocytes Relative: 21 % (ref 12–46)
Lymphs Abs: 2.3 K/uL (ref 0.7–4.0)
MCH: 29.3 pg (ref 26.0–34.0)
MCHC: 30.9 g/dL (ref 30.0–36.0)
MCV: 94.7 fL (ref 78.0–100.0)
Monocytes Absolute: 1 K/uL (ref 0.1–1.0)
Monocytes Relative: 9 % (ref 3–12)
Neutro Abs: 7.7 K/uL (ref 1.7–7.7)
Neutrophils Relative %: 70 % (ref 43–77)
Platelets: 558 K/uL — ABNORMAL HIGH (ref 150–400)
RBC: 3.38 MIL/uL — ABNORMAL LOW (ref 3.87–5.11)
RDW: 17.2 % — ABNORMAL HIGH (ref 11.5–15.5)
WBC: 11 K/uL — ABNORMAL HIGH (ref 4.0–10.5)

## 2013-10-10 LAB — COMPREHENSIVE METABOLIC PANEL WITH GFR
ALT: 16 U/L (ref 0–35)
AST: 26 U/L (ref 0–37)
Albumin: 2.4 g/dL — ABNORMAL LOW (ref 3.5–5.2)
Alkaline Phosphatase: 160 U/L — ABNORMAL HIGH (ref 39–117)
BUN: 11 mg/dL (ref 6–23)
CO2: 30 meq/L (ref 19–32)
Calcium: 9 mg/dL (ref 8.4–10.5)
Chloride: 99 meq/L (ref 96–112)
Creatinine, Ser: 0.52 mg/dL (ref 0.50–1.10)
GFR calc Af Amer: >90 mL/min (ref >90)
GFR calc non Af Amer: >90 mL/min (ref >90)
Glucose, Bld: 98 mg/dL (ref 70–99)
Potassium: 3.1 meq/L — ABNORMAL LOW (ref 3.7–5.3)
Sodium: 142 meq/L (ref 137–147)
Total Bilirubin: 0.3 mg/dL (ref 0.3–1.2)
Total Protein: 7.2 g/dL (ref 6.0–8.3)

## 2013-10-10 MED ORDER — SALINE SPRAY 0.65 % NA SOLN
1.0000 | NASAL | Status: DC | PRN
  Filled 2013-10-10: qty 44

## 2013-10-10 MED ORDER — ONDANSETRON HCL 4 MG PO TABS: 4.0000 mg | ORAL_TABLET | Freq: Four times a day (QID) | ORAL | Status: DC | PRN

## 2013-10-10 MED ORDER — LIDOCAINE-PRILOCAINE 2.5-2.5 % EX CREA
TOPICAL_CREAM | CUTANEOUS | Status: DC | PRN
  Administered 2013-10-10: 1 via TOPICAL
  Filled 2013-10-10: qty 5

## 2013-10-10 MED ORDER — POTASSIUM CHLORIDE CRYS ER 20 MEQ PO TBCR
40.0000 meq | EXTENDED_RELEASE_TABLET | Freq: Once | ORAL | Status: DC
  Filled 2013-10-10: qty 2

## 2013-10-10 MED ORDER — LORATADINE 10 MG PO TABS
10.0000 mg | ORAL_TABLET | Freq: Every day | ORAL | Status: DC | PRN
  Filled 2013-10-10: qty 1

## 2013-10-10 MED ORDER — ENOXAPARIN SODIUM 80 MG/0.8ML ~~LOC~~ SOLN
1.0000 mg/kg | Freq: Two times a day (BID) | SUBCUTANEOUS | Status: DC
  Administered 2013-10-11 (×2): 65 mg via SUBCUTANEOUS
  Filled 2013-10-10 (×3): qty 0.8

## 2013-10-10 MED ORDER — ONDANSETRON HCL 4 MG/2ML IJ SOLN
4.0000 mg | Freq: Four times a day (QID) | INTRAMUSCULAR | Status: DC | PRN
Start: 2013-10-10 — End: 2013-10-11

## 2013-10-10 MED ORDER — TRAMADOL HCL 50 MG PO TABS
50.0000 mg | ORAL_TABLET | Freq: Four times a day (QID) | ORAL | Status: DC | PRN
  Administered 2013-10-10 (×3): 50 mg via ORAL
  Filled 2013-10-10: qty 2
  Filled 2013-10-10: qty 1

## 2013-10-10 MED ORDER — PROCHLORPERAZINE MALEATE 10 MG PO TABS
10.0000 mg | ORAL_TABLET | Freq: Four times a day (QID) | ORAL | Status: DC | PRN
  Filled 2013-10-10: qty 1

## 2013-10-10 MED ORDER — PANTOPRAZOLE SODIUM 40 MG PO TBEC
40.0000 mg | DELAYED_RELEASE_TABLET | Freq: Every day | ORAL | Status: DC
  Administered 2013-10-11: 40 mg via ORAL
  Filled 2013-10-10: qty 1

## 2013-10-10 MED ORDER — POTASSIUM CHLORIDE 10 MEQ/100ML IV SOLN
10.0000 meq | INTRAVENOUS | Status: AC
  Administered 2013-10-11 (×3): 10 meq via INTRAVENOUS
  Filled 2013-10-10 (×3): qty 100

## 2013-10-10 MED ORDER — ZOLPIDEM TARTRATE 5 MG PO TABS
5.0000 mg | ORAL_TABLET | Freq: Every evening | ORAL | Status: DC | PRN
  Administered 2013-10-10: 5 mg via ORAL
  Filled 2013-10-10: qty 1

## 2013-10-10 MED ORDER — SACCHAROMYCES BOULARDII 250 MG PO CAPS
250.0000 mg | ORAL_CAPSULE | Freq: Two times a day (BID) | ORAL | Status: DC
  Administered 2013-10-10 – 2013-10-11 (×2): 250 mg via ORAL
  Filled 2013-10-10 (×3): qty 1

## 2013-10-10 MED ORDER — HYOSCYAMINE SULFATE 0.125 MG SL SUBL
0.1250 mg | SUBLINGUAL_TABLET | Freq: Four times a day (QID) | SUBLINGUAL | Status: DC | PRN
  Filled 2013-10-10: qty 1

## 2013-10-10 MED ORDER — GLYCERIN (LAXATIVE) 1.2 G RE SUPP
1.0000 | Freq: Every day | RECTAL | Status: DC | PRN
  Filled 2013-10-10: qty 1

## 2013-10-10 MED ORDER — POLYETHYLENE GLYCOL 3350 17 G PO PACK
17.0000 g | PACK | Freq: Every day | ORAL | Status: DC | PRN
  Filled 2013-10-10: qty 1

## 2013-10-10 MED ORDER — MIRTAZAPINE 15 MG PO TABS
15.0000 mg | ORAL_TABLET | Freq: Every day | ORAL | Status: DC
  Administered 2013-10-10: 15 mg via ORAL
  Filled 2013-10-10 (×2): qty 1

## 2013-10-10 MED ORDER — ONDANSETRON 4 MG PO TBDP
4.0000 mg | ORAL_TABLET | Freq: Three times a day (TID) | ORAL | Status: DC | PRN
  Filled 2013-10-10: qty 1

## 2013-10-10 MED ORDER — ALPRAZOLAM 0.25 MG PO TABS
0.1250 mg | ORAL_TABLET | Freq: Three times a day (TID) | ORAL | Status: DC | PRN
  Administered 2013-10-11: 0.125 mg via ORAL
  Filled 2013-10-10: qty 1

## 2013-10-10 MED ORDER — HYDROMORPHONE HCL 2 MG PO TABS: 2.0000 mg | ORAL_TABLET | Freq: Four times a day (QID) | ORAL | Status: DC | PRN

## 2013-10-10 MED ORDER — ENOXAPARIN SODIUM 60 MG/0.6ML ~~LOC~~ SOLN
60.0000 mg | Freq: Once | SUBCUTANEOUS | Status: AC
  Administered 2013-10-10: 60 mg via SUBCUTANEOUS
  Filled 2013-10-10: qty 0.6

## 2013-10-10 MED ORDER — SENNOSIDES-DOCUSATE SODIUM 8.6-50 MG PO TABS: 1.0000 | ORAL_TABLET | Freq: Two times a day (BID) | ORAL | Status: DC | PRN

## 2013-10-10 MED ORDER — ENSURE PO LIQD: 2.0000 mL | Freq: Three times a day (TID) | ORAL | Status: DC

## 2013-10-10 MED ORDER — ENSURE COMPLETE PO LIQD
237.0000 mL | Freq: Three times a day (TID) | ORAL | Status: DC
Start: 2013-10-10 — End: 2013-10-11
  Administered 2013-10-10 – 2013-10-11 (×3): 237 mL

## 2013-10-10 MED ORDER — PROMETHAZINE HCL 25 MG PO TABS: 25.0000 mg | ORAL_TABLET | Freq: Four times a day (QID) | ORAL | Status: DC | PRN

## 2013-10-10 MED ORDER — SODIUM CHLORIDE 0.9 % IJ SOLN: 3.0000 mL | Freq: Two times a day (BID) | INTRAMUSCULAR | Status: DC

## 2013-10-10 NOTE — H&P (Signed)
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 She presented to [facility] ED 10/10/2013 with reports of sudden onset right calf tenderness and swelling. No reports of fever or chills. No difficulty ambulation. No falls. No other reports of chest pain, palpitations, shortness of breath.  In ED, BP was 118/61, HR 98, Tmax 98.8 F and oxygen saturation of 92% on room air. Blood work revealed hemoglobin of 9.9, WBC count of 11 and BMP revealed potassium of 3.1 which was repleted in ED. She was subsequently found to have right lower extremity DVT and was started on Lovenox.   Assessment and Plan:  Principal Problem:   DVT (deep venous thrombosis) - likely due to history of colorectal ca with metastases - started on Lovenox for anticoagulation - will monitor for any signs of bleed Active Problems:   Colon cancer with liver metastases - management per oncology - will notify Dr. Benay Spice of patient's adm in am - added Dr. Benay Spice as a consultant in EPIC   Anemia of chronic disease - secondary to history of malignancy - hemoglobin is 9.9 on this admission - no indications for transfusion   Hypokalemia - repleted in ED   Severe protein calorie malnutrition - secondary to history of malignancy, recent SBO, poor oral intake  Code Status: Full Family Communication: Pt at bedside Disposition Plan: PT evaluation    Review of Systems:  Constitutional: Negative for fever, chills  and malaise/fatigue. Negative for diaphoresis.  HENT: Negative for hearing loss, ear pain, nosebleeds, congestion, sore throat, neck pain, tinnitus and ear discharge.   Eyes: Negative for blurred vision, double vision, photophobia, pain, discharge and redness.  Respiratory: Negative for cough, hemoptysis, sputum production, shortness of breath, wheezing and stridor.   Cardiovascular: Negative for chest pain, palpitations, orthopnea, claudication and leg swelling.  Gastrointestinal: Negative for nausea, vomiting and abdominal pain. Negative for heartburn, constipation, blood in stool and melena.  Genitourinary: Negative for dysuria, urgency, frequency, hematuria and flank pain.  Musculoskeletal: Negative for myalgias, back pain, joint pain and falls. Positive for right calf muscle pain Skin: Negative for itching and rash.  Neurological: Negative for dizziness and weakness. Negative for tingling, tremors, sensory change, speech change, focal weakness, loss of consciousness and headaches.  Endo/Heme/Allergies: Negative for environmental allergies and polydipsia. Does not bruise/bleed easily.  Psychiatric/Behavioral: Negative for suicidal ideas. The patient is not nervous/anxious.      Past Medical History  Diagnosis Date  . Premature ventricular contractions   . Palpitations     occasional  . Hyperlipidemia   . Osteoarthritis   . Anxiety and depression   . Chronic insomnia   . Osteopenia 01/2012  . Diverticulosis 01/2013    by CT  . DDD (degenerative disc disease), lumbar 01/2013    by CT (L5 B pars defects with 1cm slip, L5/S1 DDD with prominent B foraminal narrowing with mass effect on exiting L5 nerve root)  . GERD (gastroesophageal reflux disease)   . Asthma 06-01-13    no use of  rescue inhalers in many years    Past Surgical History  Procedure Laterality Date  . Nsvd      x2  . Btl    . Total abdominal hysterectomy      due to endometriosis w endmetrioma  . Appendectomy    .  Stress cadrio lite  04/26/1999    normal ef 76%  . Dexa osteopenia fem neck, spine  08/22/2002  . Abd/ultra sound within normal limits  05/08/2004  . Stress myoview  05/15/2006    normal  . L knee arthroscopy (other)  09/25/2008    Dr. Lorre Munroe  . R knee arthroscopy (other)  06/26/2009    lots of arthritis Dr. Mauri Pole  . Dexa  01/2012    osteopenia (T score femur -1.9, forearm -1.5)  . Breast surgery Left 06-01-13    Left breast "chip" implanted as marker(none malignant)  . Colonoscopy N/A 06/02/2013    Procedure: COLONOSCOPY;  Surgeon: Jerene Bears, MD;  Location: [facility];  Service: Gastroenterology;  Laterality: N/A;  . Laparoscopic small bowel resection N/A 09/27/2013    Procedure: EXPLORATORY LAPAROTOMY LAPAROSCOPIC ILEOTRANSVERSE COLON BYPASS;  Surgeon: Stark Klein, MD;  Location: [facility];  Service: General;  Laterality: N/A;  . Jejunostomy N/A 09/27/2013    Procedure: GASTRECTOMY FEEDING TUBE;  Surgeon: Stark Klein, MD;  Location: [facility];  Service: General;  Laterality: N/A;  . Cystoscopy with stent placement Right 09/27/2013    Procedure: CYSTOSCOPY WITH TEMPORARY RIGHT URETERAL STENT PLACEMENT;  Surgeon: Ardis Hughs, MD;  Location: [facility];  Service: Urology;  Laterality: Right;  . Cystoscopy w/ retrogrades Right 09/27/2013    Procedure: CYSTOSCOPY WITH RETROGRADE PYELOGRAM;  Surgeon: Ardis Hughs, MD;  Location: [facility];  Service: Urology;  Laterality: Right;    Social History:  reports that she has never smoked. She has never used smokeless tobacco. She reports that she does not drink alcohol or use illicit drugs.  Allergies  Allergen Reactions  . Adhesive [Tape] Other (See Comments)    Tears skin  . Amoxicillin     REACTION: RASH  . Clarithromycin     REACTION: GI UPSET  . Codeine Nausea Only and Palpitations    Family History  Problem Relation Age of Onset  . Heart attack Mother   . Aortic stenosis Mother   . Heart disease Mother     MI, aortic stenosis,  pacer  . Parkinsonism Father   . Osteoporosis Sister   . Arthritis Sister   . Breast cancer Maternal Aunt   . Stroke Neg Hx   . Drug abuse Neg Hx   . Depression Neg Hx   . Ovarian cancer Neg Hx   . Colon cancer Neg Hx     Prior to Admission medications   Medication Sig Start Date End Date Taking? Authorizing Provider  ALPRAZolam (XANAX) 0.25 MG tablet Take 0.5 tablets (0.125 mg total) by mouth 3 (three) times daily as needed for anxiety. 10/06/13  Yes Megan Dort, PA-C  ENSURE (ENSURE) 2 mLs by Gastric Tube route 3 (three) times daily.   Yes Historical Provider, MD  glycerin, Pediatric, 1.2 G SUPP Place 1 suppository rectally daily as needed for mild constipation.    Yes Historical Provider, MD  hyoscyamine (LEVSIN SL) 0.125 MG SL tablet place one tablet under the tongue every 6 hours as needed for cramping 09/23/13  Yes Jerene Bears, MD  lidocaine-prilocaine (EMLA) cream Apply topically to port a cath site one hour prior to use.  Do not rub in. Cover with plastic. 06/10/13  Yes Ladell Pier, MD  loratadine (CLARITIN) 10 MG tablet Take 10 mg by mouth daily as needed for allergies.    Yes Historical Provider, MD  mirtazapine (REMERON) 15 MG tablet Take 1 tablet (15 mg total) by mouth at bedtime. 08/29/13  Yes Ladell Pier, MD  omeprazole (PRILOSEC) 20 MG capsule Take 20 mg by mouth 2 (two) times daily.  03/08/13  Yes Ria Bush, MD  ondansetron (ZOFRAN ODT) 4 MG disintegrating tablet Take 1 tablet (4 mg total) by mouth every 8 (eight) hours as needed for nausea or vomiting. 10/06/13  Yes Megan Dort, PA-C  polyethylene glycol (MIRALAX / GLYCOLAX) packet Take 17 g by mouth daily as needed for mild constipation.    Yes Historical Provider, MD  prochlorperazine (COMPAZINE) 10 MG tablet Take 10 mg by mouth every 6 (six) hours as needed for nausea or vomiting.   Yes Historical Provider, MD  saccharomyces boulardii (FLORASTOR) 250 MG capsule Take 1 capsule (250 mg total) by mouth 2 (two) times  daily. 10/06/13  Yes Megan Dort, PA-C  sennosides-docusate sodium (SENOKOT-S) 8.6-50 MG tablet Take 1 tablet by mouth 2 (two) times daily as needed for constipation.   Yes Historical Provider, MD  traMADol (ULTRAM) 50 MG tablet Take 1-2 tablets (50-100 mg total) by mouth every 6 (six) hours as needed. 10/06/13  Yes Megan Dort, PA-C  zolpidem (AMBIEN) 10 MG tablet Take 10 mg by mouth at bedtime as needed for sleep.   Yes Historical Provider, MD  HYDROmorphone (DILAUDID) 2 MG tablet Take 1-4 tablets (2-8 mg total) by mouth every 6 (six) hours as needed (breakthrough pain only). 10/06/13   Megan Dort, PA-C  promethazine (PHENERGAN) 25 MG tablet Take 1 tablet (25 mg total) by mouth every 6 (six) hours as needed for nausea. 10/06/13   Coralie Keens, PA-C    Physical Exam: Filed Vitals:   10/10/13 1012 10/10/13 1250 10/10/13 1431 10/10/13 1449  BP: 123/65 121/58 130/58 125/62  Pulse: 111 98 103 115  Temp: 98.5 F (36.9 C)  98.8 F (37.1 C) 98.8 F (37.1 C)  TempSrc: Oral   Oral  Resp: 16 16  18   Height:    5\' 6"  (1.676 m)  Weight:    64.411 kg (142 lb)  SpO2: 96% 96% 92% 95%    Physical Exam  Constitutional: Appears well-developed and well-nourished. No distress.  HENT: Normocephalic. External right and left ear normal. Oropharynx is clear and moist.  Eyes: Conjunctivae and EOM are normal. PERRLA, no scleral icterus.  Neck: Normal ROM. Neck supple. No JVD. No tracheal deviation. No thyromegaly.  CVS: RRR, S1/S2 +, no murmurs, no gallops, no carotid bruit.  Pulmonary: Effort and breath sounds normal, no stridor, rhonchi, wheezes, rales.  Abdominal: Soft. BS +,  no distension, tenderness, rebound or guarding.  (+) G tube  Musculoskeletal: Normal range of motion. Right calf tenderness Lymphadenopathy: No lymphadenopathy noted, cervical, inguinal. Neuro: Alert. Normal reflexes, muscle tone coordination. No cranial nerve deficit. Skin: Skin is warm and dry. No rash noted. Not diaphoretic. No erythema.  No pallor.  Psychiatric: Normal mood and affect. Behavior, judgment, thought content normal.   Labs on Admission:  Basic Metabolic Panel:  Recent Labs Lab 10/04/13 0500 10/05/13 0535 10/10/13 1249  NA  --  137 142  K  --  3.6* 3.1*  CL  --  102 99  CO2  --  25 30  GLUCOSE  --  144* 98  BUN  --  16 11  CREATININE 0.40* 0.38* 0.52  CALCIUM  --  8.0* 9.0   Liver Function Tests:  Recent Labs Lab 10/10/13 1249  AST 26  ALT 16  ALKPHOS 160*  BILITOT 0.3  PROT 7.2  ALBUMIN 2.4*   CBC:  Recent Labs Lab 10/10/13 1249  WBC 11.0*  NEUTROABS 7.7  HGB 9.9*  HCT 32.0*  MCV 94.7  PLT 558*   CBG:  Recent Labs Lab 10/04/13 0602 10/04/13 1155 10/04/13 1722 10/05/13 0006 10/05/13 0810  GLUCAP 114* 148* 113* 146* 120*   Radiological Exams on Admission: No results found.   Faye Ramsay, MD  [facility] Pager [phone]  If 7PM-7AM, please contact night-coverage [email or web address] Password [facility] 10/10/2013, 8:39 PM

## 2013-10-10 NOTE — ED Provider Notes (Signed)
CSN: [id number]     Arrival date & time 10/10/13  [id number] History   First MD Initiated Contact with Patient 10/10/13 1030     Chief Complaint  Patient presents with  . Leg Pain     (Consider location/radiation/quality/duration/timing/severity/associated sxs/prior Treatment) HPI Patient presents with concern of new right calf pain and swelling. Notably, patient was discharged from the hospital one week ago after surgical procedure due to colorectal cancer. She adds that the procedure was well tolerated. Since that time she has been tolerant of both oral and G-tube feeds, though less than anticipated amounts. There was one episode of emesis, but no sustained nausea, no by mouth intolerance. There is no new fever, no new dyspnea, no new chest pain. Over the past day patient has noticed pain, soreness, tightness in the right calf. Concurrently there is size increased. There is no superficial changes, no distal weakness or dysesthesia.   Past Medical History  Diagnosis Date  . Premature ventricular contractions   . Palpitations     occasional  . Hyperlipidemia   . Osteoarthritis   . Anxiety and depression   . Chronic insomnia   . Osteopenia 01/2012  . Diverticulosis 01/2013    by CT  . DDD (degenerative disc disease), lumbar 01/2013    by CT (L5 B pars defects with 1cm slip, L5/S1 DDD with prominent B foraminal narrowing with mass effect on exiting L5 nerve root)  . GERD (gastroesophageal reflux disease)   . Asthma 06-01-13    no use of rescue inhalers in many years   Past Surgical History  Procedure Laterality Date  . Nsvd      x2  . Btl    . Total abdominal hysterectomy      due to endometriosis w endmetrioma  . Appendectomy    . Stress cadrio lite  04/26/1999    normal ef 76%  . Dexa osteopenia fem neck, spine  08/22/2002  . Abd/ultra sound within normal limits  05/08/2004  . Stress myoview  05/15/2006    normal  . L knee arthroscopy (other)  09/25/2008    Dr. Lorre Munroe  . R  knee arthroscopy (other)  06/26/2009    lots of arthritis Dr. Mauri Pole  . Dexa  01/2012    osteopenia (T score femur -1.9, forearm -1.5)  . Breast surgery Left 06-01-13    Left breast "chip" implanted as marker(none malignant)  . Colonoscopy N/A 06/02/2013    Procedure: COLONOSCOPY;  Surgeon: Jerene Bears, MD;  [facility]: [facility];  Service: Gastroenterology;  Laterality: N/A;  . Laparoscopic small bowel resection N/A 09/27/2013    Procedure: EXPLORATORY LAPAROTOMY LAPAROSCOPIC ILEOTRANSVERSE COLON BYPASS;  Surgeon: Stark Klein, MD;  Location: [facility];  Service: General;  Laterality: N/A;  . Jejunostomy N/A 09/27/2013    Procedure: GASTRECTOMY FEEDING TUBE;  Surgeon: Stark Klein, MD;  Location: [facility];  Service: General;  Laterality: N/A;  . Cystoscopy with stent placement Right 09/27/2013    Procedure: CYSTOSCOPY WITH TEMPORARY RIGHT URETERAL STENT PLACEMENT;  Surgeon: Ardis Hughs, MD;  Location: [facility];  Service: Urology;  Laterality: Right;  . Cystoscopy w/ retrogrades Right 09/27/2013    Procedure: CYSTOSCOPY WITH RETROGRADE PYELOGRAM;  Surgeon: Ardis Hughs, MD;  Location: [facility];  Service: Urology;  Laterality: Right;   Family History  Problem Relation Age of Onset  . Heart attack Mother   . Aortic stenosis Mother   . Heart disease Mother     MI, aortic stenosis, pacer  .  Parkinsonism Father   . Osteoporosis Sister   . Arthritis Sister   . Breast cancer Maternal Aunt   . Stroke Neg Hx   . Drug abuse Neg Hx   . Depression Neg Hx   . Ovarian cancer Neg Hx   . Colon cancer Neg Hx    History  Substance Use Topics  . Smoking status: Never Smoker   . Smokeless tobacco: Never Used  . Alcohol Use: No   OB History   Grav Para Term Preterm Abortions TAB SAB Ect Mult Living                 Review of Systems  Constitutional:       Per HPI, otherwise negative  HENT:       Per HPI, otherwise negative  Respiratory:       Per HPI, otherwise negative  Cardiovascular:        Per HPI, otherwise negative  Gastrointestinal: Positive for nausea and vomiting. Negative for abdominal pain.  Endocrine:       Negative aside from HPI  Genitourinary:       Neg aside from HPI   Musculoskeletal:       Per HPI, otherwise negative  Skin: Negative for color change.  Neurological: Negative for syncope.      Allergies  Adhesive; Amoxicillin; Clarithromycin; and Codeine  Home Medications   Current Outpatient Rx  Name  Route  Sig  Dispense  Refill  . ALPRAZolam (XANAX) 0.25 MG tablet   Oral   Take 0.5 tablets (0.125 mg total) by mouth 3 (three) times daily as needed for anxiety.   60 tablet   0   . ENSURE (ENSURE)   Gastric Tube   2 mLs by Gastric Tube route 3 (three) times daily.         Marland Kitchen glycerin, Pediatric, 1.2 G SUPP   Rectal   Place 1 suppository rectally daily as needed for mild constipation.          . hyoscyamine (LEVSIN SL) 0.125 MG SL tablet      place one tablet under the tongue every 6 hours as needed for cramping   30 tablet   1   . lidocaine-prilocaine (EMLA) cream      Apply topically to port a cath site one hour prior to use. Do not rub in. Cover with plastic.   30 g   1   . loratadine (CLARITIN) 10 MG tablet   Oral   Take 10 mg by mouth daily as needed for allergies.          . mirtazapine (REMERON) 15 MG tablet   Oral   Take 1 tablet (15 mg total) by mouth at bedtime.   30 tablet   5   . omeprazole (PRILOSEC) 20 MG capsule   Oral   Take 20 mg by mouth 2 (two) times daily.          . ondansetron (ZOFRAN ODT) 4 MG disintegrating tablet   Oral   Take 1 tablet (4 mg total) by mouth every 8 (eight) hours as needed for nausea or vomiting.   40 tablet   0   . polyethylene glycol (MIRALAX / GLYCOLAX) packet   Oral   Take 17 g by mouth daily as needed for mild constipation.          . prochlorperazine (COMPAZINE) 10 MG tablet   Oral   Take 10 mg by mouth every 6 (six) hours as needed  for nausea or  vomiting.         Marland Kitchen saccharomyces boulardii (FLORASTOR) 250 MG capsule   Oral   Take 1 capsule (250 mg total) by mouth 2 (two) times daily.         . sennosides-docusate sodium (SENOKOT-S) 8.6-50 MG tablet   Oral   Take 1 tablet by mouth 2 (two) times daily as needed for constipation.         . traMADol (ULTRAM) 50 MG tablet   Oral   Take 1-2 tablets (50-100 mg total) by mouth every 6 (six) hours as needed.   50 tablet   0   . zolpidem (AMBIEN) 10 MG tablet   Oral   Take 10 mg by mouth at bedtime as needed for sleep.         Marland Kitchen HYDROmorphone (DILAUDID) 2 MG tablet   Oral   Take 1-4 tablets (2-8 mg total) by mouth every 6 (six) hours as needed (breakthrough pain only).   15 tablet   0   . promethazine (PHENERGAN) 25 MG tablet   Oral   Take 1 tablet (25 mg total) by mouth every 6 (six) hours as needed for nausea.   40 tablet   0    BP 123/65  Pulse 111  Temp(Src) 98.5 F (36.9 C) (Oral)  Resp 16  SpO2 96% Physical Exam  Nursing note and vitals reviewed. Constitutional: She is oriented to person, place, and time. She appears well-developed and well-nourished. No distress.  HENT:  Head: Normocephalic and atraumatic.  Eyes: Conjunctivae and EOM are normal.  Cardiovascular: Normal rate and regular rhythm.   Pulmonary/Chest: Effort normal and breath sounds normal. No stridor. No respiratory distress.  Abdominal: She exhibits no distension.  Abdomen is nontender.  There is a midline surgical dressing in place with no surrounding erythema, or discharge. There is a G-tube with no discharge  Musculoskeletal: She exhibits no edema.  Patient is 5/5 strength in both hips with flexion. She has appropriate ankle and foot range of motion and strength. There is size asymmetry, with right calf greater than left, with no superficial changes, and minimal tenderness to palpation about the right calf.   Neurological: She is alert and oriented to person, place, and time. No  cranial nerve deficit.  Skin: Skin is warm and dry.  Psychiatric: She has a normal mood and affect.    ED Course  Procedures (including critical care time) Labs Review Labs Reviewed  CBC WITH DIFFERENTIAL  COMPREHENSIVE METABOLIC PANEL   Imaging Review No results found.  Prior to the initial evaluation I reviewed the patient's chart, including her recent procedure, and her oncologic history.  Update: I discussed the patient's case with our ultrasonographer, there is evidence of a right sided ECT. Only discussed her care with her oncologist, we will start Lovenox therapy.  I discussed course of care with patient, and with her daughter, a hospice nurse. The patient reiterates that she is interested in all therapeutic options. MDM   Final diagnoses:  None    This patient with the history of metastatic colorectal cancer, recent surgery now presents with right sided leg pain, has new DVT.  In addition patient is tachycardic, with decreased by mouth intake. Patient required admission for further evaluation and management after I discussed her care with her oncologist to corroborate treatment plan.    Carmin Muskrat, MD 10/10/13 1318

## 2013-10-10 NOTE — ED Notes (Signed)
Pt is a ca pt, recently discharged from hospital, had abdominal surgery for ca, still has staples w/ dressing in place, pt also has Gtube which she gives herself feedings through throughout the day, still eating regular food also, pt states yesterday woke up and her lower legs were sore, right greater then the left, no redness or warmth.

## 2013-10-10 NOTE — Progress Notes (Signed)
ANTICOAGULATION CONSULT NOTE - Initial Consult  Pharmacy Consult for Lovenox Indication: DVT treatment   Allergies  Allergen Reactions  . Adhesive [Tape] Other (See Comments)    Tears skin  . Amoxicillin     REACTION: RASH  . Clarithromycin     REACTION: GI UPSET  . Codeine Nausea Only and Palpitations    Patient Measurements:   Heparin Dosing Weight:   Vital Signs: Temp: 98.8 F (37.1 C) (02/09 1449) Temp src: Oral (02/09 1449) BP: 125/62 mmHg (02/09 1449) Pulse Rate: 115 (02/09 1449)  Labs:  Recent Labs  10/10/13 1249  HGB 9.9*  HCT 32.0*  PLT 558*  CREATININE 0.52    The CrCl is unknown because both a height and weight (above a minimum accepted value) are required for this calculation.   Medical History: Past Medical History  Diagnosis Date  . Premature ventricular contractions   . Palpitations     occasional  . Hyperlipidemia   . Osteoarthritis   . Anxiety and depression   . Chronic insomnia   . Osteopenia 01/2012  . Diverticulosis 01/2013    by CT  . DDD (degenerative disc disease), lumbar 01/2013    by CT (L5 B pars defects with 1cm slip, L5/S1 DDD with prominent B foraminal narrowing with mass effect on exiting L5 nerve root)  . GERD (gastroesophageal reflux disease)   . Asthma 06-01-13    no use of rescue inhalers in many years    Assessment: Kelly [name] with history of metastatic colorectal cancer s/p lap ileotransverse colon bypass surgery 1/27 presents to [facility] with DVT in RLE proximal posterior tibial vein.  Pharmacy consulted to dose full dose lovenox for VTE treatment.  Patient has already received lovenox 60mg  x 1 at 1400.    CBC:  Hgb 9.9, plts 558 SCr 0.52, CrCl 66 (N), 61 (CG) Weight 64.4kg  Goal of Therapy:  Anti-Xa level 0.6-1 units/ml 4hrs after LMWH dose given Monitor platelets by anticoagulation protocol: Yes   Plan:  Lovenox 1mg /kg SQ q 12 hours.  F/u renal function, CBC.   Will try to slowly adjust times to be more convenient for  patient.    Jerris Keltz E 10/10/2013,2:57 PM

## 2013-10-10 NOTE — ED Notes (Signed)
Pt ca pt. Pt complains of R calf pain since last night. Pt states she was hospitalized recently. Pt not on blood thinners.

## 2013-10-10 NOTE — Progress Notes (Signed)
*  Preliminary Results* Right lower extremity venous duplex completed. Right lower extremity is positive for deep vein thrombosis involving a 1-2 inch segment of a single proximal posterior tibial vein. There is no evidence of right Baker's cyst.  Preliminary results discussed with Dr.Lockwood.  10/10/2013 12:33 PM  Maudry Mayhew, RVT, RDCS, RDMS

## 2013-10-10 NOTE — Progress Notes (Signed)
[facility]  Patient Status: Active (receiving services up to time of hospitalization)  [facility] is providing the following services: RN, PT and OT  If patient discharges after hours, please call [phone].   Kelly [name] 10/10/2013, 4:14 PM

## 2013-10-11 DIAGNOSIS — T451X5A Adverse effect of antineoplastic and immunosuppressive drugs, initial encounter: Secondary | ICD-10-CM

## 2013-10-11 DIAGNOSIS — I82409 Acute embolism and thrombosis of unspecified deep veins of unspecified lower extremity: Secondary | ICD-10-CM

## 2013-10-11 DIAGNOSIS — E46 Unspecified protein-calorie malnutrition: Secondary | ICD-10-CM

## 2013-10-11 DIAGNOSIS — D702 Other drug-induced agranulocytosis: Secondary | ICD-10-CM

## 2013-10-11 DIAGNOSIS — C180 Malignant neoplasm of cecum: Secondary | ICD-10-CM

## 2013-10-11 DIAGNOSIS — R6881 Early satiety: Secondary | ICD-10-CM

## 2013-10-11 DIAGNOSIS — D6481 Anemia due to antineoplastic chemotherapy: Secondary | ICD-10-CM

## 2013-10-11 DIAGNOSIS — D638 Anemia in other chronic diseases classified elsewhere: Secondary | ICD-10-CM

## 2013-10-11 DIAGNOSIS — R630 Anorexia: Secondary | ICD-10-CM

## 2013-10-11 LAB — BASIC METABOLIC PANEL
BUN: 11 mg/dL (ref 6–23)
CHLORIDE: 97 meq/L (ref 96–112)
CO2: 30 mEq/L (ref 19–32)
Calcium: 8.1 mg/dL — ABNORMAL LOW (ref 8.4–10.5)
Creatinine, Ser: 0.43 mg/dL — ABNORMAL LOW (ref 0.50–1.10)
GLUCOSE: 87 mg/dL (ref 70–99)
POTASSIUM: 3.2 meq/L — AB (ref 3.7–5.3)
Sodium: 137 mEq/L (ref 137–147)

## 2013-10-11 LAB — CBC
HCT: 27.6 % — ABNORMAL LOW (ref 36.0–46.0)
HEMOGLOBIN: 8.6 g/dL — AB (ref 12.0–15.0)
MCH: 29.6 pg (ref 26.0–34.0)
MCHC: 31.2 g/dL (ref 30.0–36.0)
MCV: 94.8 fL (ref 78.0–100.0)
Platelets: 508 10*3/uL — ABNORMAL HIGH (ref 150–400)
RBC: 2.91 MIL/uL — ABNORMAL LOW (ref 3.87–5.11)
RDW: 17.5 % — ABNORMAL HIGH (ref 11.5–15.5)
WBC: 10.7 10*3/uL — ABNORMAL HIGH (ref 4.0–10.5)

## 2013-10-11 MED ORDER — SODIUM CHLORIDE 0.9 % IJ SOLN
10.0000 mL | INTRAMUSCULAR | Status: DC | PRN
  Administered 2013-10-11: 10 mL

## 2013-10-11 MED ORDER — ENOXAPARIN SODIUM 100 MG/ML ~~LOC~~ SOLN
1.5000 mg/kg | SUBCUTANEOUS | Status: DC
Start: 2013-10-11 — End: 2013-11-03

## 2013-10-11 MED ORDER — BISACODYL 10 MG RE SUPP: 10.0000 mg | Freq: Every day | RECTAL | Status: DC | PRN

## 2013-10-11 MED ORDER — HEPARIN SOD (PORK) LOCK FLUSH 100 UNIT/ML IV SOLN
500.0000 [IU] | INTRAVENOUS | Status: AC | PRN
  Administered 2013-10-11: 500 [IU]

## 2013-10-11 NOTE — Care Management Note (Addendum)
    Page 1 of 1   10/11/2013     2:11:57 PM   CARE MANAGEMENT NOTE 10/11/2013  Patient:  Kelly [name], Kelly [name]   Account Number:  [id number]  Date Initiated:  10/11/2013  Documentation initiated by:  [facility]  Subjective/Objective Assessment:   73 Y/O F ADMITTED W/R DVT.     Action/Plan:   FORM HOME.ACTIVE [place] HHRN/PT/OT.   Anticipated DC Date:  10/11/2013   Anticipated DC Plan:  [place]  CM consult      [facility] Choice  Resumption Of Svcs/PTA Provider   Choice offered to / List presented to:  C-1 Patient        [place] arranged  HH-1 RN  [place].   Status of service:  Completed, signed off Medicare Important Message given?   (If response is "NO", the following Medicare IM given date fields will be blank) Date Medicare IM given:   Date Additional Medicare IM given:    Discharge Disposition:  [place]  Per UR Regulation:  Reviewed for med. necessity/level of care/duration of stay  If discussed at [place] of Stay Meetings, dates discussed:    Comments:  10/11/13 Ahijah Devery RN,BSN NCM [place].HHRN/PT/OT RESUMED.NO FURTHER D/C NEEDS.

## 2013-10-11 NOTE — Discharge Summary (Signed)
Physician Discharge Summary  Kelly [name] [id number] DOB: 01/08/1941 DOA: 10/10/2013  PCP: Ria Bush, MD  Admit date: 10/10/2013 Discharge date: 10/11/2013  Time spent: 35 minutes  Recommendations for Outpatient Follow-up:  1. Follow up with Oncology as scheduled 2. Follow up with PCP in 1-2 weeks 3. Recommend Hospice referral at later date. Pt declines at this time.  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 She presented to [facility] ED 10/10/2013 with reports of sudden onset right calf tenderness and swelling. No reports of fever or chills. No difficulty ambulation. No falls. No other reports of chest pain, palpitations, shortness of breath.  In ED, BP was 118/61, HR 98, Tmax 98.8 F and oxygen saturation of 92% on room air. Blood work revealed hemoglobin of 9.9, WBC count of 11 and BMP revealed potassium of 3.1 which was repleted in ED. She was subsequently found to have right lower extremity DVT and was started on Lovenox.   Hospital Course:  DVT (deep venous thrombosis)  - likely due to history of colorectal ca with metastases  - started on Lovenox for anticoagulation   Colon cancer with liver metastases  - management per oncology  - Oncology recs for outpt follow up at Cancer center on 10/19/13  Anemia of chronic disease  - secondary to  history of malignancy  - hemoglobin is 9.9 on this admission  - no indications for transfusion  Hypokalemia  - repleted in ED  Severe protein calorie malnutrition  - secondary to history of malignancy, recent SBO, poor oral intake  Consultations:  Oncology  Discharge Exam: Filed Vitals:   10/10/13 1431 10/10/13 1449 10/10/13 2045 10/11/13 0501  BP: 130/58 125/62 118/61 109/49  Pulse: 103 115 103 93  Temp: 98.8 F (37.1 C) 98.8 F (37.1 C) 98.2 F (36.8 C) 97.7 F (36.5 C)  TempSrc:  Oral Axillary Oral  Resp:  18 18 18   Height:  5\' 6"  (1.676 m)    Weight:  64.411 kg (142 lb)    SpO2: 92% 95% 100% 95%    General: awake, in nad Cardiovascular: regular, s1, s2 Respiratory: normal resp effort  Discharge Instructions       Future Appointments Provider Department Dept Phone   10/19/2013 11:00 AM Ladell Pier, MD [facility] [phone]   10/24/2013 2:00 PM Stark Klein, MD [facility] Surgery, [place] [phone]       Medication List         ALPRAZolam 0.25 MG tablet  Commonly known as:  XANAX  Take 0.5 tablets (0.125 mg total) by mouth 3 (three) times daily as needed for anxiety.     CLARITIN 10 MG tablet  Generic drug:  loratadine  Take 10 mg by mouth daily as needed for allergies.     enoxaparin 100 MG/ML injection  Commonly known as:  LOVENOX  Inject 0.95  mLs (95 mg total) into the skin daily.     ENSURE  2 mLs by Gastric Tube route 3 (three) times daily.     glycerin (Pediatric) 1.2 G Supp  Place 1 suppository rectally daily as needed for mild constipation.     HYDROmorphone 2 MG tablet  Commonly known as:  DILAUDID  Take 1-4 tablets (2-8 mg total) by mouth every 6 (six) hours as needed (breakthrough pain only).     hyoscyamine 0.125 MG SL tablet  Commonly known as:  LEVSIN SL  place one tablet under the tongue every 6 hours as needed for cramping     lidocaine-prilocaine cream  Commonly known as:  EMLA  Apply  topically to port a cath site one hour prior to use. Do not rub in. Cover with plastic.     mirtazapine 15 MG tablet  Commonly known as:  REMERON  Take 1 tablet (15 mg total) by mouth at bedtime.     omeprazole 20 MG capsule  Commonly known as:  PRILOSEC  Take 20 mg by mouth 2 (two) times daily.     ondansetron 4 MG disintegrating tablet  Commonly known as:  ZOFRAN ODT  Take 1 tablet (4 mg total) by mouth every 8 (eight) hours as needed for nausea or vomiting.     polyethylene glycol packet  Commonly known as:  MIRALAX / GLYCOLAX  Take 17 g by mouth daily as needed for mild constipation.     prochlorperazine 10 MG tablet  Commonly known as:  COMPAZINE  Take 10 mg by mouth every 6 (six) hours as needed for nausea or vomiting.     promethazine 25 MG tablet  Commonly known as:  PHENERGAN  Take 1 tablet (25 mg total) by mouth every 6 (six) hours as needed for nausea.     saccharomyces boulardii 250 MG capsule  Commonly known as:  FLORASTOR  Take 1 capsule (250 mg total) by mouth 2 (two) times daily.     sennosides-docusate sodium 8.6-50 MG tablet  Commonly known as:  SENOKOT-S  Take 1 tablet by mouth 2 (two) times daily as needed for constipation.     traMADol 50 MG tablet  Commonly known as:  ULTRAM  Take 1-2 tablets (50-100 mg total) by mouth every 6 (six) hours as needed.     zolpidem 10 MG tablet  Commonly known as:  AMBIEN  Take 10 mg by mouth at bedtime as needed for sleep.       Allergies  Allergen Reactions  . Adhesive [Tape] Other (See Comments)    Tears skin  . Amoxicillin     REACTION: RASH  . Clarithromycin     REACTION: GI UPSET  . Codeine Nausea Only and Palpitations   Follow-up Information   Follow up with Betsy Coder, MD. (as scheduled on 10/19/13)    Specialty:  Oncology   Contact information:   [place] [address] [phone]        The results of significant diagnostics from this hospitalization (including  imaging, microbiology, ancillary and laboratory) are listed below for reference.    Significant Diagnostic Studies: Ct Abdomen Pelvis W Contrast  09/23/2013   CLINICAL DATA:  Intractable vomiting and abdominal pain. Colon carcinoma. Currently undergoing chemotherapy.  EXAM: CT ABDOMEN AND PELVIS WITH CONTRAST  TECHNIQUE: Multidetector CT imaging of the abdomen and pelvis was performed using the standard protocol following bolus administration of intravenous contrast.  CONTRAST:  169mL OMNIPAQUE IOHEXOL 300 MG/ML  SOLN  COMPARISON:  08/18/2013  FINDINGS: A distal small bowel obstruction is seen with increased small bowel dilatation since prior exam. Transition zone is seen in the right pelvis, where there is increased size of 2 ill-defined soft tissue masses in the right anterior and posterior pelvis. Index mass in the right anterior pelvis measures 3.9 x 4.3 cm on image 67 compared with 2.9 x 3.0 cm previously. This is consistent with increased metastatic disease. Mesenteric lymphadenopathy in the lower abdomen and pelvis and multiple small peritoneal tumor implants within the omental fat showed nose significant  Mild mesenteric lymphadenopathy within the lower abdomen pelvis appears stable. 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 A loculated fluid collection in the left posterior pelvis remains stable.  Diffuse liver metastases show no significant change in size or number since previous study. Largest index lesion is seen in the lateral segment left hepatic lobe measuring 3.5 x 3.5 cm on image 16, without change since prior exam.  The gallbladder, pancreas, spleen, adrenal glands, and kidneys are normal in appearance. No evidence of hydronephrosis. No acute inflammatory process identified. No suspicious bone lesions identified.  IMPRESSION: Distal small bowel obstruction, with increased small bowel dilatation compared to prior exam.  Peritoneal carcinomatosis,  with mild increase in size of peritoneal soft tissue masses in the right pelvis at site of distal small bowel obstruction.  Stable diffuse liver metastases and mild mesenteric lymphadenopathy.  Mild improvement in omental carcinomatosis in the left abdomen.   Electronically Signed   By: Earle Gell M.D.   On: 09/23/2013 19:43   Dg Chest Port 1 View  09/26/2013   CLINICAL DATA:  PICC line placement.  EXAM: PORTABLE CHEST - 1 VIEW  COMPARISON:  DG ABD ACUTE W/CHEST dated 09/21/2013; IR FLUORO GUIDE CV LINE*R* dated 06/14/2013  FINDINGS: Left-sided PICC line has been placed with the tip in the lower SVC. There is stable positioning of a right-sided Port-A-Cath. The lungs are clear. The heart size is normal.  IMPRESSION: PICC line tip is in the SVC.   Electronically Signed   By: Aletta Edouard M.D.   On: 09/26/2013 22:10   Dg Abd 2 Views  09/26/2013   CLINICAL DATA:  Small-bowel obstruction  EXAM: ABDOMEN - 2 VIEW  COMPARISON:  09/23/2013  FINDINGS: Minimal bowel gas is noted likely related to fluid-filled loops of small bowel. There is likely some persistent small bowel dilatation given the appearance. No free air is seen. No bony abnormality is noted.  IMPRESSION: Multiple fluid filled mildly dilated loops of small bowel.   Electronically Signed   By: Inez Catalina M.D.   On: 09/26/2013 12:39   Dg Abd Acute W/chest  09/21/2013   CLINICAL DATA:  Colon cancer and emesis.  EXAM: ACUTE ABDOMEN SERIES (ABDOMEN 2 VIEW & CHEST 1 VIEW)  COMPARISON:  08/29/2013  FINDINGS: Chest radiograph demonstrates a right jugular Port-A-Cath. Catheter tip is at the junction of SVC and right atrium. Mild elevation of the right hemidiaphragm. Lungs are clear without airspace disease. Heart size is normal. No evidence for free air.  Nonspecific bowel gas pattern with gas in the colon. Few air-fluid levels in the abdomen. Stable calcification in the right lower abdomen is probably related to a phlebolith.  IMPRESSION: No acute chest  findings.  Nonspecific bowel gas pattern.   Electronically Signed   By: Markus Daft M.D.   On: 09/21/2013 16:46   Dg C-arm 1-60 Min-no Report  09/27/2013   CLINICAL DATA: stent   C-ARM 1-60 MINUTES  Fluoroscopy was utilized by the requesting physician.  No radiographic  interpretation.     Microbiology: No results found for this or any previous visit (from the past 240 hour(s)).   Labs: Basic Metabolic Panel:  Recent Labs Lab 10/05/13 0535 10/10/13 1249 10/11/13 0350  NA 137 142 137  K 3.6* 3.1* 3.2*  CL 102 99 97  CO2 25 30 30   GLUCOSE 144* 98 87  BUN 16 11 11   CREATININE 0.38* 0.52 0.43*  CALCIUM 8.0* 9.0 8.1*   Liver Function Tests:  Recent Labs Lab 10/10/13 1249  AST 26  ALT 16  ALKPHOS 160*  BILITOT 0.3  PROT 7.2  ALBUMIN 2.4*   No results found for this basename: LIPASE, AMYLASE,  in the last 168 hours No results found for this basename: AMMONIA,  in the last 168 hours CBC:  Recent Labs Lab 10/10/13 1249 10/11/13 0350  WBC 11.0* 10.7*  NEUTROABS 7.7  --   HGB 9.9* 8.6*  HCT 32.0* 27.6*  MCV 94.7 94.8  PLT 558* 508*   Cardiac Enzymes: No results found for this basename: CKTOTAL, CKMB, CKMBINDEX, TROPONINI,  in the last 168 hours BNP: BNP (last 3 results) No results found for this basename: PROBNP,  in the last 8760 hours CBG:  Recent Labs Lab 10/04/13 1722 10/05/13 0006 10/05/13 0810  GLUCAP 113* 146* 120*    Signed:  Jewell Ryans K  [facility] 10/11/2013, 1:50 PM

## 2013-10-11 NOTE — Progress Notes (Signed)
IP PROGRESS NOTE  Subjective:   She was discharged on 10/06/2013 after an admission with a bowel obstruction. She developed right leg pain on 10/09/2013 and presented to the emergency room on 10/10/2013. A right lower extremity Doppler was positive for a deep vein thrombosis in the posterior tibial vein. [name] was admitted for treatment of the DVT.  She continues to have anorexia and limited oral intake. She has been eating small amounts of food and using the gastrostomy tube for nutrition supplements. The bowels have been moving. She reports 1 episode of nausea and vomiting since discharge from the hospital. No significant pain.  Objective: Vital signs in last 24 hours: Blood pressure 109/49, pulse 93, temperature 97.7 F (36.5 C), temperature source Oral, resp. rate 18, height $RemoveBe'5\' 6"'vIRWzcesn$  (1.676 m), weight 142 lb (64.411 kg), SpO2 95.00%.  Intake/Output from previous day: 02/09 0701 - 02/10 0700 In: 310 [P.O.:60; IV Piggyback:200] Out: -   Physical Exam:  Lungs: Clear bilaterally Cardiac: Regular rate and rhythm Abdomen: Healed midline incision with Steri-Strips in place. Left upper quadrant gastrostomy tube site without evidence of infection, the abdomen is soft. Extremities: No leg edema or erythema   Portacath/PICC-without erythema  Lab Results:  Recent Labs  10/10/13 1249 10/11/13 0350  WBC 11.0* 10.7*  HGB 9.9* 8.6*  HCT 32.0* 27.6*  PLT 558* 508*    BMET  Recent Labs  10/10/13 1249 10/11/13 0350  NA 142 137  K 3.1* 3.2*  CL 99 97  CO2 30 30  GLUCOSE 98 87  BUN 11 11  CREATININE 0.52 0.43*  CALCIUM 9.0 8.1*    Studies/Results: No results found.  Medications: I have reviewed the patient's current medications.  Assessment/Plan: 1. Stage IV colon cancer, colonoscopy 06/02/2013 found a mass at the cecum with a biopsy confirming adenocarcinoma positive K-ras mutation, no loss of expression of mismatch repair proteins, microsatellite stable  Cycle 1  FOLFOX 06/15/2013  Cycle 2 FOLFOX/Avastin 06/29/2013  Cycle 3 FOLFOX/Avastin 07/21/2013  Cycle 4 FOLFOX/Avastin 08/03/2013  CT 08/18/2013 with mild improvement of hepatic metastases and peritoneal/omental metastases, the distal ileum dilatation. Treatment was switched to FOLFIRI secondary to persistent obstructive symptoms and an allergic reaction to oxaliplatin  Cycle 1 FOLFIRI 08/31/2013  Cycle 2 FOLFIRI 09/14/2013 2. 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 Acute nausea/vomiting 06/08/2013-? Related to partial obstruction  4. History of an arrhythmia  5. Depression  6. Anorexia -persistent  7. Port-A-Cath placement 06/14/2013  8. neutropenia secondary chemotherapy, Neulasta was added with cycle 2 FOLFOX  9. Allergic reaction at the completion of the oxaliplatin infusion with cycle 4  10. Early satiety-likely related to gastroparesis/ileus,/partial bowel obstruction 11. Admission with a small bowel obstruction 09/23/2013, status post an exploratory laparotomy, ileum to transverse colon bypass, placement of a right ureter stent, and gastrostomy tube on 09/27/2013 12. Right leg DVT 10/10/2013 13. Anemia secondary to malnutrition, chronic disease, surgery, and chemotherapy  [name] was admitted with a right lower extremity deep vein thrombosis on 10/10/2013. The DVT can be treated as an outpatient. I will recommend chronic Lovenox therapy.  I discussed the overall prognosis and treatment options with Ms. Badman and her family. She has metastatic colon cancer involving the liver and abdominal carcinomatosis. She understands no therapy will be curative. She has been treated with to systemic chemotherapy regimens and was found to have extensive carcinomatosis requiring a palliative bypass procedure 09/27/2013. Her performance status is not adequate to allow for further chemotherapy  at present. We discussed hospice care. She became upset with the discussion  of hospice and is not ready to make a decision on this at present. She understands hospice care may be temporary. If her performance status improves we can consider salvage systemic therapy.  Recommendations  1. Once daily Lovenox for treatment of the DVT 2. Outpatient followup at the [facility] will be arranged for 10/19/2013 3. Consult surgery for removal of the abdominal wound staples  She appears okay for discharge from an oncology standpoint. Please call as needed. I will see her as an outpatient next week.       LOS: 1 day   Kelly [name]  10/11/2013, 10:56 AM

## 2013-10-11 NOTE — Progress Notes (Signed)
Patient's husband administered noon dose of lovenox injection.  Patient's husband taught by Probation officer and supervised by Probation officer during injection.  Patient's husband verbalized and demonstrated understanding.  Lovenox injection information handouts given from exit care for patient and family to take home.   Prior to discharge, writer removed twenty five staples from midline abdominal incision and applied steri strips.

## 2013-10-12 ENCOUNTER — Inpatient Hospital Stay: Payer: Medicare Other

## 2013-10-12 ENCOUNTER — Telehealth (INDEPENDENT_AMBULATORY_CARE_PROVIDER_SITE_OTHER): Payer: Self-pay

## 2013-10-12 ENCOUNTER — Other Ambulatory Visit: Payer: Medicare Other

## 2013-10-12 ENCOUNTER — Encounter (INDEPENDENT_AMBULATORY_CARE_PROVIDER_SITE_OTHER): Payer: Medicare Other

## 2013-10-12 ENCOUNTER — Encounter: Payer: Medicare Other | Admitting: Nutrition

## 2013-10-12 ENCOUNTER — Ambulatory Visit: Payer: Medicare Other | Admitting: Oncology

## 2013-10-12 NOTE — Telephone Encounter (Signed)
Pt was readmitted to [facility] after her initial surgery for a blood clot.  ER physician instructed her staples be removed at that time.  Pt has appt with Dr. Barry Dienes on  10/24/13 for post op.

## 2013-10-13 ENCOUNTER — Telehealth (INDEPENDENT_AMBULATORY_CARE_PROVIDER_SITE_OTHER): Payer: Self-pay

## 2013-10-13 NOTE — Telephone Encounter (Signed)
Verbal order needed to remove sutures from J Tube site.  Occupational therapy will not assist patient until sutures have been removed.  Please call with Verbal Order for suture removal if authorized by Physician.  Patient s/p EXPLORATORY LAPAROTOMY LAPAROSCOPIC ILEOTRANSVERSE COLON BYPASS GASTRECTOMY FEEDING TUBE.

## 2013-10-17 ENCOUNTER — Telehealth (INDEPENDENT_AMBULATORY_CARE_PROVIDER_SITE_OTHER): Payer: Self-pay

## 2013-10-17 NOTE — Telephone Encounter (Signed)
[facility] nurse calling to report that OT will not start care because the patient still has sutures which are holding in her gastrostomy tube.  Will make Dr. Barry Dienes aware and call Vinnie Level, RN back with information.

## 2013-10-18 NOTE — Telephone Encounter (Signed)
Verbal order given to Vinnie Level, RN to remove G tube sutures.  She will relay to [facility] OT nurse so patient can get bathing and ambulating assistance.

## 2013-10-18 NOTE — Telephone Encounter (Signed)
What do sutures in the G tube have to do with OT? fb And, sutures can be removed

## 2013-10-19 ENCOUNTER — Telehealth: Payer: Self-pay | Admitting: Oncology

## 2013-10-19 ENCOUNTER — Ambulatory Visit (HOSPITAL_BASED_OUTPATIENT_CLINIC_OR_DEPARTMENT_OTHER): Payer: Medicare Other | Admitting: Oncology

## 2013-10-19 VITALS — BP 131/67 | HR 123 | Temp 98.1°F | Resp 20 | Ht 66.0 in | Wt 132.4 lb

## 2013-10-19 DIAGNOSIS — R109 Unspecified abdominal pain: Secondary | ICD-10-CM

## 2013-10-19 DIAGNOSIS — C189 Malignant neoplasm of colon, unspecified: Secondary | ICD-10-CM

## 2013-10-19 DIAGNOSIS — R6881 Early satiety: Secondary | ICD-10-CM

## 2013-10-19 DIAGNOSIS — D6481 Anemia due to antineoplastic chemotherapy: Secondary | ICD-10-CM

## 2013-10-19 DIAGNOSIS — C787 Secondary malignant neoplasm of liver and intrahepatic bile duct: Secondary | ICD-10-CM

## 2013-10-19 DIAGNOSIS — C786 Secondary malignant neoplasm of retroperitoneum and peritoneum: Secondary | ICD-10-CM

## 2013-10-19 DIAGNOSIS — C180 Malignant neoplasm of cecum: Secondary | ICD-10-CM

## 2013-10-19 DIAGNOSIS — D702 Other drug-induced agranulocytosis: Secondary | ICD-10-CM

## 2013-10-19 DIAGNOSIS — R630 Anorexia: Secondary | ICD-10-CM

## 2013-10-19 DIAGNOSIS — T451X5A Adverse effect of antineoplastic and immunosuppressive drugs, initial encounter: Secondary | ICD-10-CM

## 2013-10-19 DIAGNOSIS — F329 Major depressive disorder, single episode, unspecified: Secondary | ICD-10-CM

## 2013-10-19 DIAGNOSIS — I82409 Acute embolism and thrombosis of unspecified deep veins of unspecified lower extremity: Secondary | ICD-10-CM

## 2013-10-19 DIAGNOSIS — D638 Anemia in other chronic diseases classified elsewhere: Secondary | ICD-10-CM

## 2013-10-19 DIAGNOSIS — F3289 Other specified depressive episodes: Secondary | ICD-10-CM

## 2013-10-19 NOTE — Telephone Encounter (Signed)
gv dtr appt schedule for march

## 2013-10-19 NOTE — Progress Notes (Signed)
[name]    OFFICE PROGRESS NOTE   INTERVAL HISTORY:   She was discharged from the hospital on 10/11/2013. She returns for followup of metastatic colon cancer. She feels "weak ". Limited oral intake. She feels full after eating a few bites of food. She is taking small amounts of "boost "per the G-tube several times per day. She is able to take 2 or 3 ounces at a time. Her bowels are moving 2-3 times per day. No vomiting. The G-tube has remained clamped.  Mild back discomfort is relieved with tramadol. Ms. Heimann reports her quality of life is poor. She does not like not being able to keep normally. She continues Lovenox anticoagulation.  Objective:  Vital signs in last 24 hours:  Blood pressure 131/67, pulse 123, temperature 98.1 F (36.7 C), temperature source Oral, resp. rate 20, height $RemoveBe'5\' 6"'eQSnOnTfj$  (1.676 m), weight 132 lb 6.4 oz (60.056 kg), SpO2 97.00%.    HEENT: The mucous membranes are moist. No thrush. Resp: Lungs clear bilaterally Cardio: Regular rate and rhythm GI: Mildly distended, no mass. Healed midline incision with Steri-Strips in place. Left upper quadrant gastrostomy tube site without evidence of infection Vascular: No leg edema  Portacath/PICC-without erythema  Lab Results:  Lab Results  Component Value Date   WBC 10.7* 10/11/2013   HGB 8.6* 10/11/2013   HCT 27.6* 10/11/2013   MCV 94.8 10/11/2013   PLT 508* 10/11/2013   NEUTROABS 7.7 10/10/2013      Medications: I have reviewed the patient's current medications.  Assessment/Plan: 1. Stage IV colon cancer, colonoscopy 06/02/2013 found a mass at the cecum with a biopsy confirming adenocarcinoma positive K-ras mutation, no loss of expression of mismatch repair proteins, microsatellite stable  Cycle 1 FOLFOX 06/15/2013  Cycle 2 FOLFOX/Avastin 06/29/2013  Cycle 3 FOLFOX/Avastin 07/21/2013  Cycle 4 FOLFOX/Avastin 08/03/2013  CT 08/18/2013 with mild improvement of hepatic metastases and  peritoneal/omental metastases, the distal ileum dilatation. Treatment was switched to FOLFIRI secondary to persistent obstructive symptoms and an allergic reaction to oxaliplatin  Cycle 1 FOLFIRI 08/31/2013  Cycle 2 FOLFIRI 09/14/2013 2. 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 Acute nausea/vomiting 06/08/2013-? Related to partial obstruction  4. History of an arrhythmia  5. Depression  6. Anorexia -persistent  7. Port-A-Cath placement 06/14/2013  8. neutropenia secondary chemotherapy, Neulasta was added with cycle 2 FOLFOX  9. Allergic reaction at the completion of the oxaliplatin infusion with cycle 4  10. Early satiety-likely related to gastroparesis/ileus,/partial bowel obstruction  11. Admission with a small bowel obstruction 09/23/2013, status post an exploratory laparotomy, ileum to transverse colon bypass, placement of a right ureter stent, and gastrostomy tube on 09/27/2013  12. Right leg DVT 10/10/2013 -on Lovenox 13. Anemia secondary to malnutrition, chronic disease, surgery, and chemotherapy   Disposition:  Kelly [name] has metastatic colon cancer. She underwent a palliative small bowel to colon bypass in the setting of a cecal mass and carcinomatosis. Her performance status is poor. She is not a candidate for further systemic therapy. The plan is to proceed with a comfort/supportive care approach. We discussed the indication for continuing Lovenox. She would like to continue the Lovenox for now.  I recommend home hospice care. [name] does not wish to enroll in hospice program at present. We removed the sutures from the gastrostomy tube. She will return for an office visit in 2 weeks. I urged her to increase her oral intake and tube feedings as tolerated. She asked  about her predicted lifespan. I estimated her lifespan to be weeks if her oral intake does not improve.   Betsy Coder, MD  10/19/2013  12:27 PM

## 2013-10-21 ENCOUNTER — Encounter: Payer: Self-pay | Admitting: *Deleted

## 2013-10-21 NOTE — Progress Notes (Signed)
[facility] Work  Clinical Social Work was referred by patient navigator for assessment of psychosocial needs due to current decline in status.  Clinical Social Worker phoned patient at home to offer support and assess for needs. Pt was asleep and resting. CSW spoke with Pt's husband, Ronalee Belts at length and allowed him to process current situation. He reports HH through [facility] is going well and Pt is getting RN and PT services currently. They continue to have a good, strong support network of family, neighbors and friends. CSW discussed common emotions experienced by caregivers and allowed, Ronalee Belts time to reflect. He shared that he is actually really enjoying this special time providing care for his wife, as she has provided care for him over the years. He denied any unmet needs currently. CSW asked what their thoughts were regarding hospice and what they understood about this service. Ronalee Belts reports he wants pt to make that decision for herself and is currently not ready. CSW spent some time discussing the services hospice can provide. Ronalee Belts appreciated the call and CSW will continue to check in on them in the weeks ahead. He agrees to call CSW if concerns arise.     Clinical Social Work interventions: Supportive listening and emotional support Resource education  Loren Racer, [place] Clinical Social Worker [facility] [place] Wednesday, Thursday and Friday Phone: [phone] Fax: [phone]

## 2013-10-22 ENCOUNTER — Other Ambulatory Visit: Payer: Self-pay | Admitting: Oncology

## 2013-10-24 ENCOUNTER — Encounter (INDEPENDENT_AMBULATORY_CARE_PROVIDER_SITE_OTHER): Payer: Medicare Other | Admitting: General Surgery

## 2013-10-24 ENCOUNTER — Encounter: Payer: Self-pay | Admitting: *Deleted

## 2013-10-24 NOTE — Progress Notes (Signed)
RECEIVED A FAX FROM [facility] CONCERNING A PRIOR AUTHORIZATION FOR ONDANSETRON. THIS REQUEST WAS PLACED IN THE MANAGED CARE BIN. 

## 2013-10-25 ENCOUNTER — Encounter: Payer: Self-pay | Admitting: Oncology

## 2013-10-25 NOTE — Progress Notes (Signed)
[facility], [id number], approved ondansetron from 07/27/13-10/25/14

## 2013-10-28 ENCOUNTER — Telehealth: Payer: Self-pay

## 2013-10-28 NOTE — Telephone Encounter (Signed)
Jerlyn Ly PT with [facility] left v/m; pt was not feeling well today and cancelled PT appt today. Ann does not require cb.

## 2013-10-28 NOTE — Telephone Encounter (Signed)
[place]

## 2013-11-02 ENCOUNTER — Telehealth: Payer: Self-pay | Admitting: Oncology

## 2013-11-02 ENCOUNTER — Ambulatory Visit (HOSPITAL_BASED_OUTPATIENT_CLINIC_OR_DEPARTMENT_OTHER): Payer: Medicare Other | Admitting: Oncology

## 2013-11-02 VITALS — BP 107/69 | HR 129 | Temp 98.3°F | Resp 18 | Ht 66.0 in | Wt 127.1 lb

## 2013-11-02 DIAGNOSIS — C180 Malignant neoplasm of cecum: Secondary | ICD-10-CM

## 2013-11-02 DIAGNOSIS — T451X5A Adverse effect of antineoplastic and immunosuppressive drugs, initial encounter: Secondary | ICD-10-CM

## 2013-11-02 DIAGNOSIS — Z7901 Long term (current) use of anticoagulants: Secondary | ICD-10-CM

## 2013-11-02 DIAGNOSIS — D702 Other drug-induced agranulocytosis: Secondary | ICD-10-CM

## 2013-11-02 DIAGNOSIS — C189 Malignant neoplasm of colon, unspecified: Secondary | ICD-10-CM

## 2013-11-02 DIAGNOSIS — D6481 Anemia due to antineoplastic chemotherapy: Secondary | ICD-10-CM

## 2013-11-02 DIAGNOSIS — C787 Secondary malignant neoplasm of liver and intrahepatic bile duct: Secondary | ICD-10-CM

## 2013-11-02 DIAGNOSIS — C786 Secondary malignant neoplasm of retroperitoneum and peritoneum: Secondary | ICD-10-CM

## 2013-11-02 DIAGNOSIS — D649 Anemia, unspecified: Secondary | ICD-10-CM

## 2013-11-02 DIAGNOSIS — I82409 Acute embolism and thrombosis of unspecified deep veins of unspecified lower extremity: Secondary | ICD-10-CM

## 2013-11-02 NOTE — Telephone Encounter (Signed)
GAve pt appt to husband today MD only on 3/18

## 2013-11-02 NOTE — Telephone Encounter (Signed)
Gave pt appt for 3/18 md only

## 2013-11-02 NOTE — Progress Notes (Signed)
   [place]    OFFICE PROGRESS NOTE   INTERVAL HISTORY:   She returns for scheduled followup of metastatic colon cancer. She continues to have a limited oral intake and activity level. She can tolerate only small volumes of tube feedings. She reports 1 episode of emesis over the past 2 weeks. Ms. Osterloh has discomfort at the right "hip "in the evening.she continues Lovenox. One episode of nose bleeding.  Objective:  Vital signs in last 24 hours:  Blood pressure 107/69, pulse 129, temperature 98.3 F (36.8 C), temperature source Oral, resp. rate 18, height $RemoveBe'5\' 6"'VdKsFBDjv$  (1.676 m), weight 127 lb 1.6 oz (57.652 kg), SpO2 100.00%.    HEENT: no thrush or ulcers Resp: lungs clear bilaterally Cardio: regular rate and rhythm GI: no hepatomegaly, mildly distended, gastrostomy tube site without evidence of infection Vascular: no leg edema   Portacath/PICC-without erythema    Medications: I have reviewed the patient's current medications.  Assessment/Plan: 1. Stage IV colon cancer, colonoscopy 06/02/2013 found a mass at the cecum with a biopsy confirming adenocarcinoma positive K-ras mutation, no loss of expression of mismatch repair proteins, microsatellite stable  Cycle 1 FOLFOX 06/15/2013  Cycle 2 FOLFOX/Avastin 06/29/2013  Cycle 3 FOLFOX/Avastin 07/21/2013  Cycle 4 FOLFOX/Avastin 08/03/2013  CT 08/18/2013 with mild improvement of hepatic metastases and peritoneal/omental metastases, the distal ileum dilatation. Treatment was switched to FOLFIRI secondary to persistent obstructive symptoms and an allergic reaction to oxaliplatin  Cycle 1 FOLFIRI 08/31/2013  Cycle 2 FOLFIRI 09/14/2013 2. 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 Acute nausea/vomiting 06/08/2013-? Related to partial obstruction  4. History of an arrhythmia  5. Depression  6. Anorexia -persistent  7. Port-A-Cath placement 06/14/2013  8. neutropenia secondary  chemotherapy, Neulasta was added with cycle 2 FOLFOX  9. Allergic reaction at the completion of the oxaliplatin infusion with cycle 4  10. Early satiety-likely related to gastroparesis/ileus,/partial bowel obstruction  11. Admission with a small bowel obstruction 09/23/2013, status post an exploratory laparotomy, ileum to transverse colon bypass, placement of a right ureter stent, and gastrostomy tube on 09/27/2013  12. Right leg DVT 10/10/2013 -on Lovenox  13. Anemia secondary to malnutrition, chronic disease, surgery, and chemotherapy   Disposition:  She appears unchanged. She continues to have a poor appetite. Ms. Barnick would like to continue Lovenox anticoagulation. She declines hospice care. She will return for an office visi tand Port-A-Cath flush in 2 weeks.   Betsy Coder, MD  11/02/2013  6:07 PM

## 2013-11-03 ENCOUNTER — Telehealth (INDEPENDENT_AMBULATORY_CARE_PROVIDER_SITE_OTHER): Payer: Self-pay | Admitting: General Surgery

## 2013-11-03 ENCOUNTER — Other Ambulatory Visit (INDEPENDENT_AMBULATORY_CARE_PROVIDER_SITE_OTHER): Payer: Self-pay | Admitting: *Deleted

## 2013-11-03 ENCOUNTER — Other Ambulatory Visit (INDEPENDENT_AMBULATORY_CARE_PROVIDER_SITE_OTHER): Payer: Self-pay | Admitting: General Surgery

## 2013-11-03 ENCOUNTER — Ambulatory Visit (HOSPITAL_COMMUNITY)
Admission: RE | Admit: 2013-11-03 | Discharge: 2013-11-03 | Disposition: A | Discharge status: Home / Self Care | Payer: Medicare Other | Source: Ambulatory Visit | Attending: General Surgery | Admitting: General Surgery

## 2013-11-03 ENCOUNTER — Other Ambulatory Visit: Payer: Self-pay | Admitting: *Deleted

## 2013-11-03 DIAGNOSIS — T85528A Displacement of other gastrointestinal prosthetic devices, implants and grafts, initial encounter: Secondary | ICD-10-CM

## 2013-11-03 DIAGNOSIS — Z434 Encounter for attention to other artificial openings of digestive tract: Secondary | ICD-10-CM

## 2013-11-03 DIAGNOSIS — Z431 Encounter for attention to gastrostomy: Secondary | ICD-10-CM | POA: Insufficient documentation

## 2013-11-03 MED ORDER — LIDOCAINE VISCOUS 2 % MT SOLN
OROMUCOSAL | Status: AC
  Administered 2013-11-03: 15 mL via OROMUCOSAL
  Filled 2013-11-03: qty 15

## 2013-11-03 MED ORDER — ENOXAPARIN SODIUM 80 MG/0.8ML ~~LOC~~ SOLN: 80.0000 mg | SUBCUTANEOUS | Status: AC

## 2013-11-03 MED ORDER — LIDOCAINE VISCOUS 2 % MT SOLN
15.0000 mL | Freq: Once | OROMUCOSAL | Status: AC
  Administered 2013-11-03: 15 mL via OROMUCOSAL

## 2013-11-03 MED ORDER — IOHEXOL 300 MG/ML  SOLN
20.0000 mL | Freq: Once | INTRAMUSCULAR | Status: AC | PRN
  Administered 2013-11-03: 10 mL

## 2013-11-03 NOTE — Procedures (Signed)
Successful replacement of 20Fr balloon retention gastrostomy tube. No complications. See PACS/Imaging for full report.  Ascencion Dike PA-C Interventional Radiology 11/03/2013 12:29 PM

## 2013-11-03 NOTE — Telephone Encounter (Addendum)
Called [facility] to discontinue PT services per pt request. Will continue with nursing only. Decreased Lovenox dose sent to pharmacy. Called pt with this info. Spoke with daughter, Arby Barrette who reports pt vomited once last night. Noticed her abdomen seems distended. Attached drainage bag to GTube, no residual. Having loose stools. Reviewed with Dr. Benay Spice: If obstructed, feedings will back up into stomach/ drain. Continue antiemetics. Water only today, if no better will need to have XRay.  1050: Call from [place] reporting [name] has fallen out. They are en route to IR to have replaced.- Dr. Benay Spice made aware.

## 2013-11-03 NOTE — Telephone Encounter (Signed)
Spoke with Gaspar Bidding at [facility] outpatient pharmacy. Lovenox cost will be the same as filling at her local pharmacy. Pt will continue to pick up at [facility].

## 2013-11-03 NOTE — Telephone Encounter (Signed)
Received call from one of her caregivers (Page, a Equities trader) to report that she went over to try to flush pt's G-tube. When she arrived, the G-tube was out.  Per VO Dr. Barry Dienes, order implemented to return to IR at [facility] for re-placement of the G-Tube.  They were informed to "go now" and Page will bring her to [facility] right away.

## 2013-11-04 ENCOUNTER — Other Ambulatory Visit: Payer: Self-pay | Admitting: Family Medicine

## 2013-11-04 ENCOUNTER — Encounter: Payer: Self-pay | Admitting: Oncology

## 2013-11-04 NOTE — Progress Notes (Signed)
Put daughter's fmla form on nurse's desk. °

## 2013-11-04 NOTE — Progress Notes (Signed)
Faxed daughter's fmla form to [employer] @ [phone]

## 2013-11-05 NOTE — Telephone Encounter (Signed)
plz phone in. 

## 2013-11-07 NOTE — Telephone Encounter (Signed)
Rx called in as directed/cs 

## 2013-11-09 ENCOUNTER — Telehealth: Payer: Self-pay | Admitting: *Deleted

## 2013-11-09 NOTE — Telephone Encounter (Signed)
[facility] nurse manager, Hilario Quarry, RN was notified by daughter, Arby Barrette that patient is now ready for Hospice care. Referral faxed to [facility] with request to see patient asap.

## 2013-11-16 ENCOUNTER — Ambulatory Visit: Payer: Medicare Other | Admitting: Oncology

## 2013-11-21 ENCOUNTER — Telehealth: Payer: Self-pay | Admitting: *Deleted

## 2013-11-21 MED ORDER — ALPRAZOLAM 0.25 MG PO TABS: 0.1250 mg | ORAL_TABLET | Freq: Two times a day (BID) | ORAL | Status: AC | PRN

## 2013-11-21 NOTE — Telephone Encounter (Signed)
Arbie Cookey, RN called to request refill on bid xanax and to report patient has not had BM in several days. Have increased her Senna and are opening her G-tube to drain prn discomfort/nausea and this helps. Just wanted physician aware. Refill called in as requested.

## 2013-11-22 ENCOUNTER — Telehealth: Payer: Self-pay | Admitting: *Deleted

## 2013-11-22 MED ORDER — NYSTATIN 100000 UNIT/ML MT SUSP: 5.0000 mL | Freq: Three times a day (TID) | OROMUCOSAL | Status: AC | PRN

## 2013-11-22 NOTE — Telephone Encounter (Signed)
Fluconazole not very effective for thrush-still with white patches and daughter is requesting Nystatin. OK per Dr. Benay Spice Requested that nurse offer patient an appointment here per Dr. Benay Spice request if she wants to see him.

## 2013-11-25 ENCOUNTER — Other Ambulatory Visit: Payer: Self-pay | Admitting: *Deleted

## 2013-11-25 MED ORDER — PROCHLORPERAZINE MALEATE 10 MG PO TABS: 10.0000 mg | ORAL_TABLET | Freq: Four times a day (QID) | ORAL | Status: AC | PRN

## 2013-11-25 NOTE — Telephone Encounter (Signed)
Message from [place], [place] with hospice requesting refill on Compazine to be sent to [facility] pharmacy. Same done. Also reporting excoriation under GTube site. Asking for barrier cream order. Returned call to [place]. Per Dr. Benay Spice: OK to apply barrier cream to GTube site PRN.

## 2013-11-28 ENCOUNTER — Telehealth: Payer: Self-pay | Admitting: *Deleted

## 2013-11-28 ENCOUNTER — Encounter: Payer: Self-pay | Admitting: Oncology

## 2013-11-28 NOTE — Progress Notes (Signed)
Put daughter's fmla form on nurse's desk. °

## 2013-11-28 NOTE — Telephone Encounter (Signed)
[facility] called; states "pt pain is well controlled with current meds.  Takes 2-3 mg of Dilaudid and 1-2 tablets Tramadol BID.  Just left pt house and daughters asking what route will be best in the future for pt since she is declining;  Crushing pain meds for J-tube, SL, etc."  Hospice RN states pt is having harder time swallowing and daughters are trying to "look ahead"  Note to Dr. Benay Spice.

## 2013-11-29 ENCOUNTER — Encounter: Payer: Self-pay | Admitting: Oncology

## 2013-11-29 NOTE — Progress Notes (Signed)
Faxed daughter's fmla form to [phone]

## 2013-11-30 ENCOUNTER — Telehealth: Payer: Self-pay | Admitting: *Deleted

## 2013-11-30 NOTE — Telephone Encounter (Signed)
Call from [facility] with hospice requesting Rx for liquid pain medicine to be faxed to pt's pharmacy. She is declining and family is requesting a prescription to have med in the home over the upcoming holiday weekend.

## 2013-12-01 MED ORDER — OXYCODONE HCL 20 MG/ML PO CONC: 10.0000 mg | ORAL | Status: AC | PRN

## 2013-12-01 NOTE — Telephone Encounter (Signed)
Oxyfast Rx sent to pt's pharmacy. Notified Arbie Cookey, hospice RN of medication order.

## 2013-12-02 ENCOUNTER — Telehealth: Payer: Self-pay | Admitting: *Deleted

## 2013-12-02 NOTE — Telephone Encounter (Signed)
VM from [facility] pharmacy that they do not have the concentrated oxycodone in stock and can't get it until late next week or the following week.  Called daughter, Arby Barrette to inquire where to send script and was informed "we've taken care of it".

## 2013-12-05 ENCOUNTER — Telehealth: Payer: Self-pay | Admitting: *Deleted

## 2013-12-05 NOTE — Telephone Encounter (Signed)
Daughter requesting order for I & O cath-has not voided since Friday. Also asking for home suction in place in case it is needed. Called back and gave OK for I/O cath and "oxygen package" in home. Left on nurse's voice mail.

## 2013-12-06 ENCOUNTER — Telehealth: Payer: Self-pay | Admitting: *Deleted

## 2013-12-06 MED ORDER — ATROPINE SULFATE 1 % OP SOLN: 4.0000 [drp] | OPHTHALMIC | Status: AC | PRN

## 2013-12-06 MED ORDER — LORAZEPAM 1 MG PO TABS: 1.0000 mg | ORAL_TABLET | ORAL | Status: AC | PRN

## 2013-12-06 NOTE — Telephone Encounter (Signed)
Family requesting ativan for agitation and atropine drops for oral secretions in future. Both approved by Dr. Benay Spice. Notified Arbie Cookey, Therapist, sports.

## 2013-12-30 DEATH — deceased

## 2014-04-25 ENCOUNTER — Other Ambulatory Visit: Payer: Self-pay | Admitting: Pharmacist

## 2014-12-15 IMAGING — CT CT ABD-PELV W/O CM
2 of 4 series · 17 of 46 positions shown, 19 images · non-contrast
Comparison: 05/30/2013

CLINICAL DATA: Liver metastases. Worsening abdominal pain after
recent colonoscopy.

EXAM:
CT ABDOMEN AND PELVIS WITHOUT CONTRAST
TECHNIQUE: Multidetector CT imaging of the abdomen and pelvis was performed
following the standard protocol without intravenous contrast.

[Series 2: rtn a/p w/o · axial · non-contrast · 0.66mm/px · z∈[-412,-16]mm · 14 of 87 slices shown, 16 images]
[im 4/87  soft-tissue]
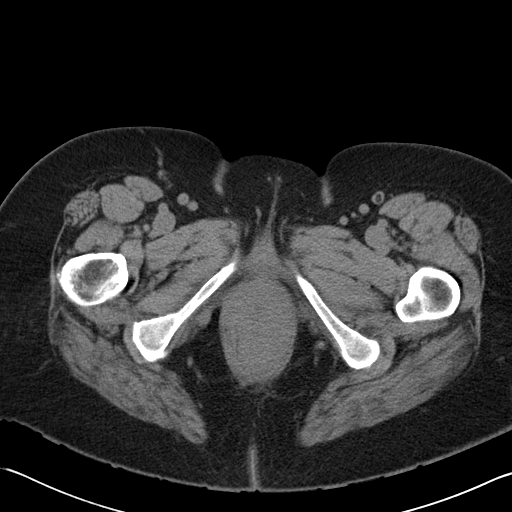
[im 4/87  bone]
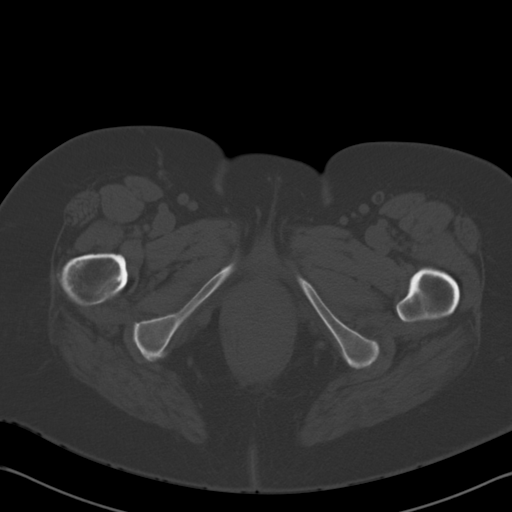
[im 11/87  soft-tissue]
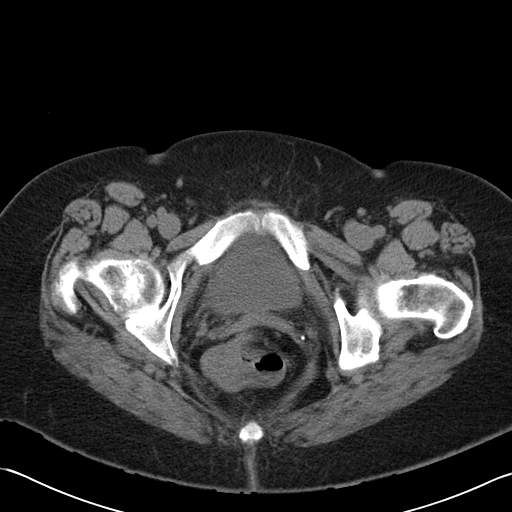
[im 18/87  soft-tissue]
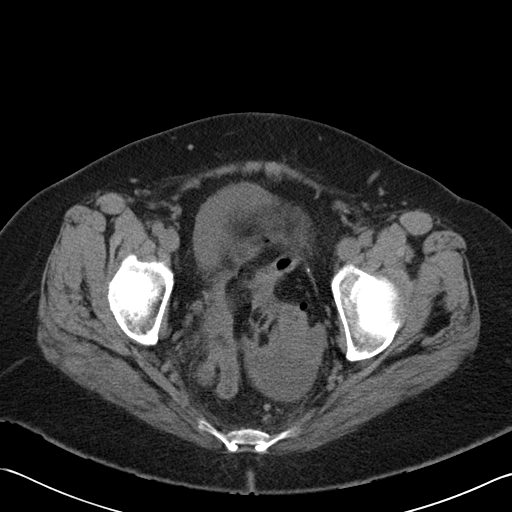
[im 22/87  soft-tissue]
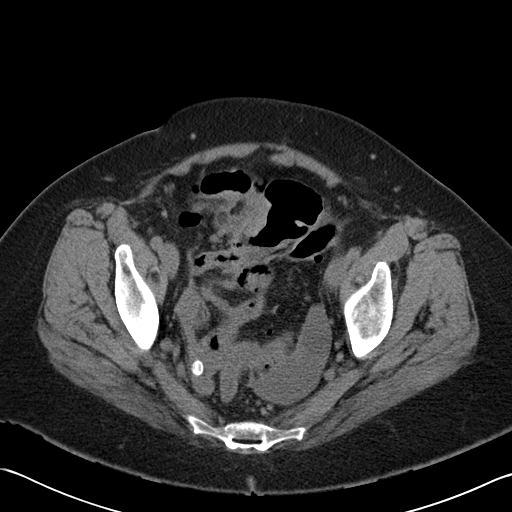
[im 29/87  soft-tissue]
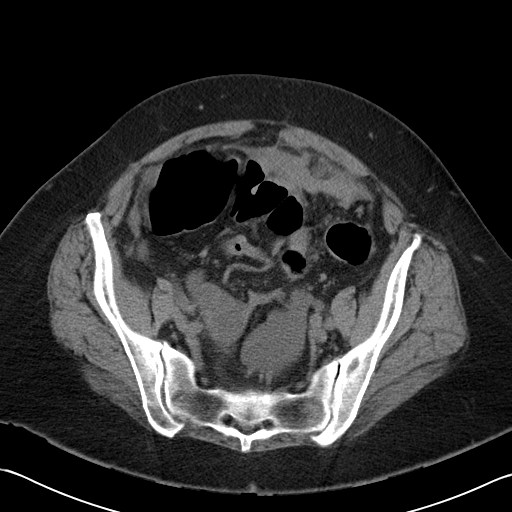
[im 36/87  soft-tissue]
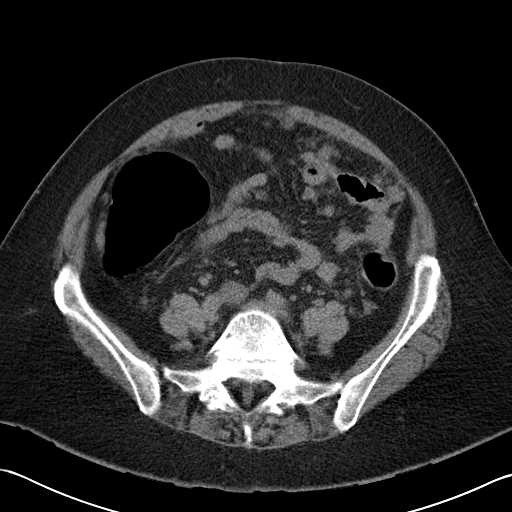
[im 40/87  soft-tissue]
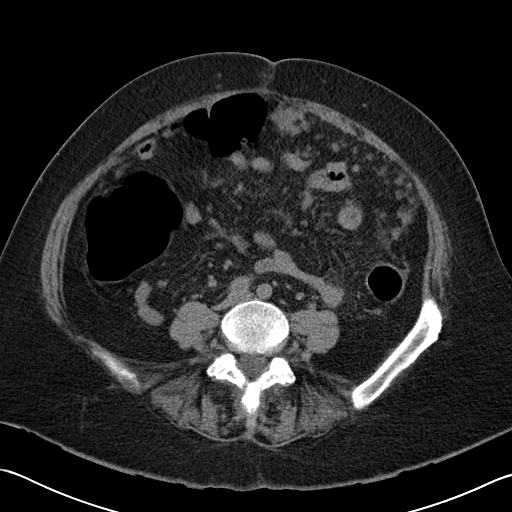
[im 47/87  soft-tissue]
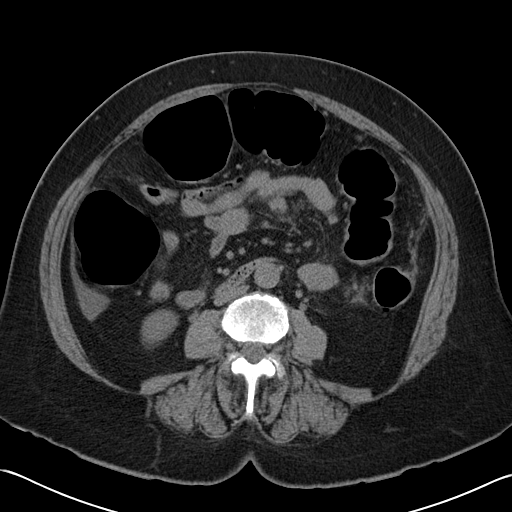
[im 51/87  soft-tissue]
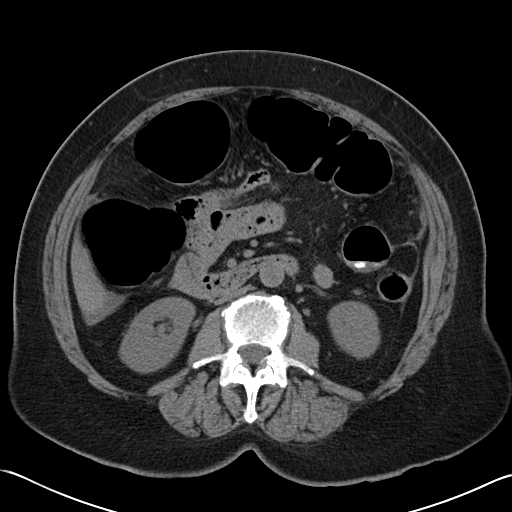
[im 51/87  bone]
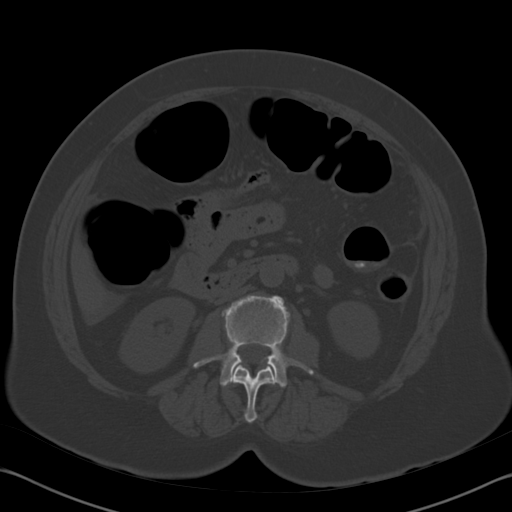
[im 58/87  soft-tissue]
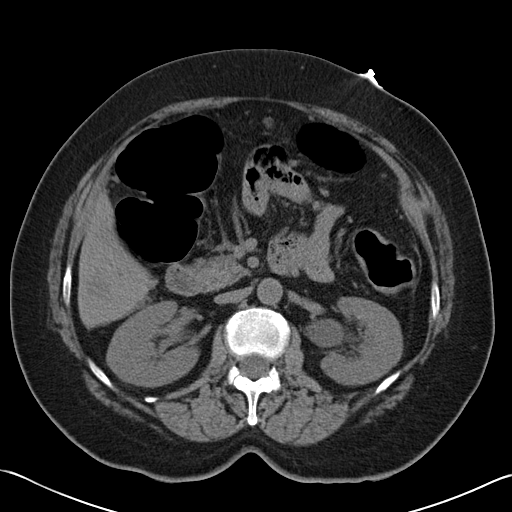
[im 65/87  soft-tissue]
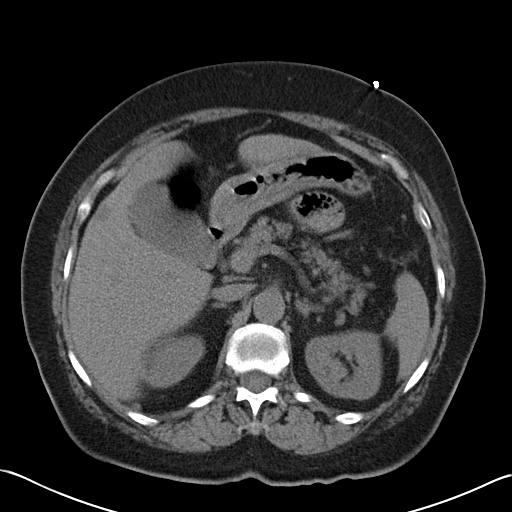
[im 69/87  soft-tissue]
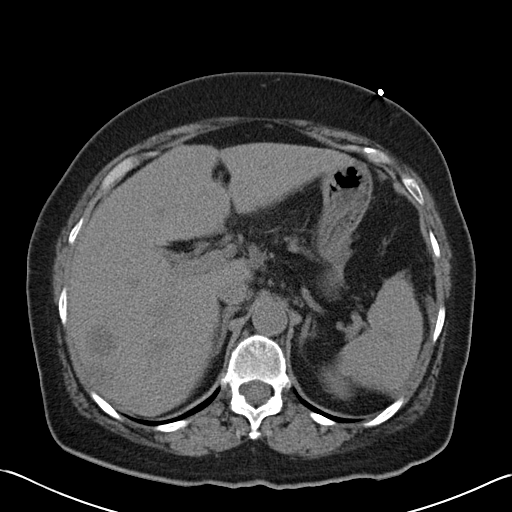
[im 76/87  soft-tissue]
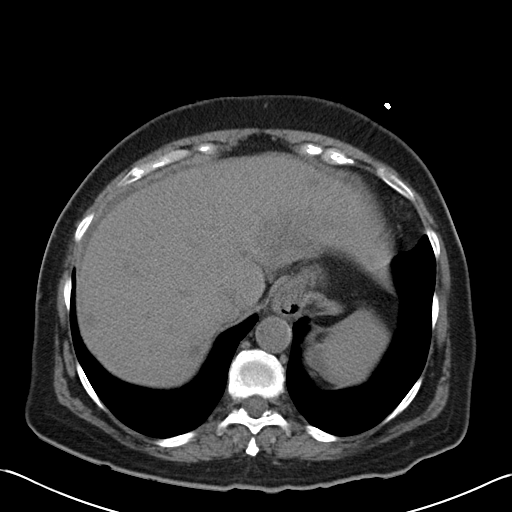
[im 83/87  soft-tissue]
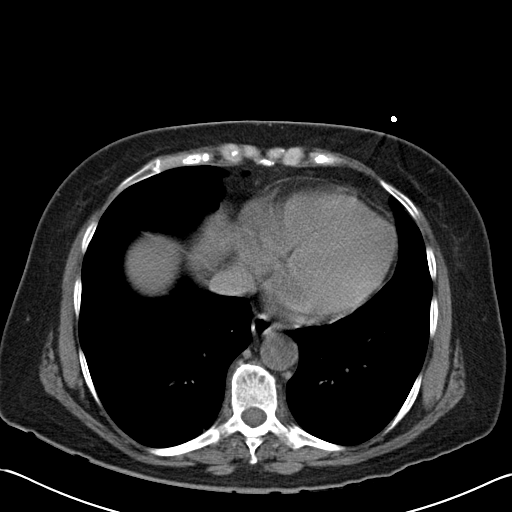

[Series 602: <mpr thick range> · coronal · 0.85mm/px · 3 of 100 slices shown]
[im 34/100  soft-tissue]
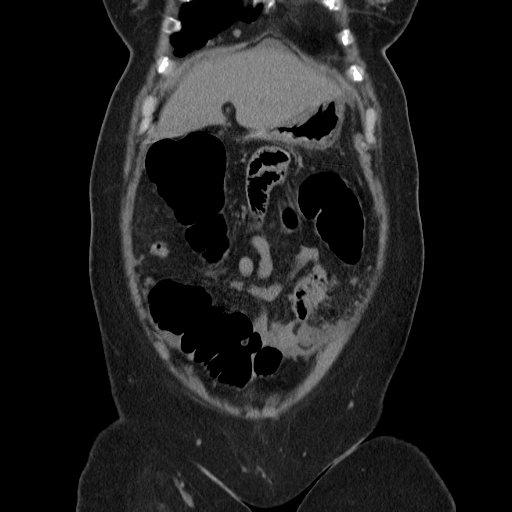
[im 45/100  soft-tissue]
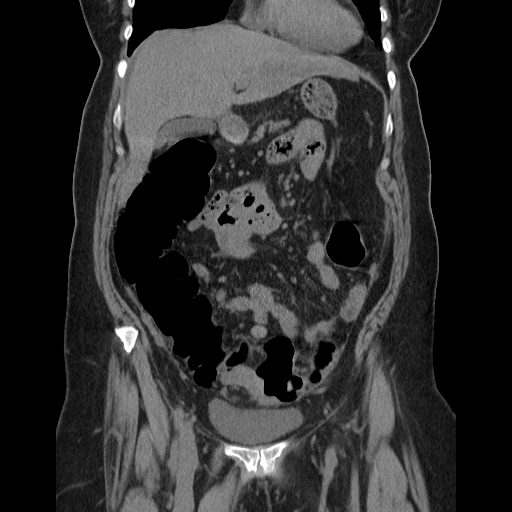
[im 56/100  soft-tissue]
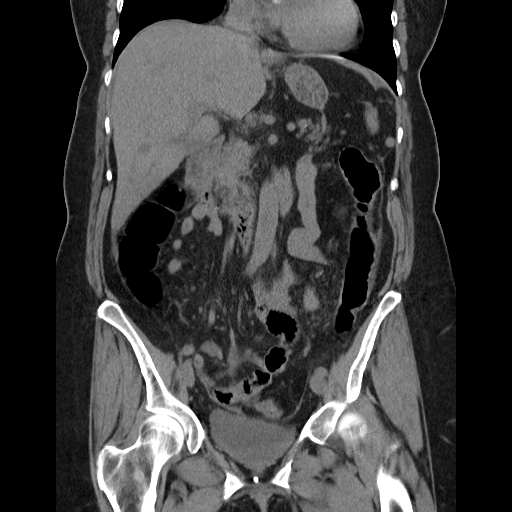

[17 of 46 positions shown; findings below may reference images not displayed]

FINDINGS: Multiple low attenuation liver masses are again seen, consistent
with liver metastases. Diffuse gaseous distention of the colon again
demonstrated, consistent with ileus. No evidence of free air.
Multiple small soft tissue nodules are again seen within the omental
fat and there is also mild mesenteric lymphadenopathy, consistent
with metastatic disease. Small amount of pelvic ascites is seen.

A persistent masslike soft tissue density is seen involving the
right colon at the level of the ileocecal valve which measures
cm and is suspicious for primary colon carcinoma. The largest
mesenteric lymph node seen in the right lower quadrant measures
cm which is stable. No evidence of acute inflammatory process or
abscess.

Incidentally noted is bilateral pars defects at L5 with degenerative
disc disease and grade 2 anterolisthesis at L5-S1.
IMPRESSION: Diffuse colonic distention, consistent with ileus.

4.5 cm mass involving the right colon at the level of the ileocecal
valve, consistent with primary colon carcinoma.

Stable mesenteric lymphadenopathy, consistent with metastatic
disease.

Stable omental carcinomatosis and small amount of ascites.

Stable diffuse liver metastases.

## 2015-04-05 IMAGING — CR DG ABDOMEN ACUTE W/ 1V CHEST
3 series · 3 of 3 positions shown · non-contrast
Comparison: 08/29/2013

CLINICAL DATA: Colon cancer and emesis.

EXAM:
ACUTE ABDOMEN SERIES (ABDOMEN 2 VIEW & CHEST 1 VIEW)

[w abdomen decub]
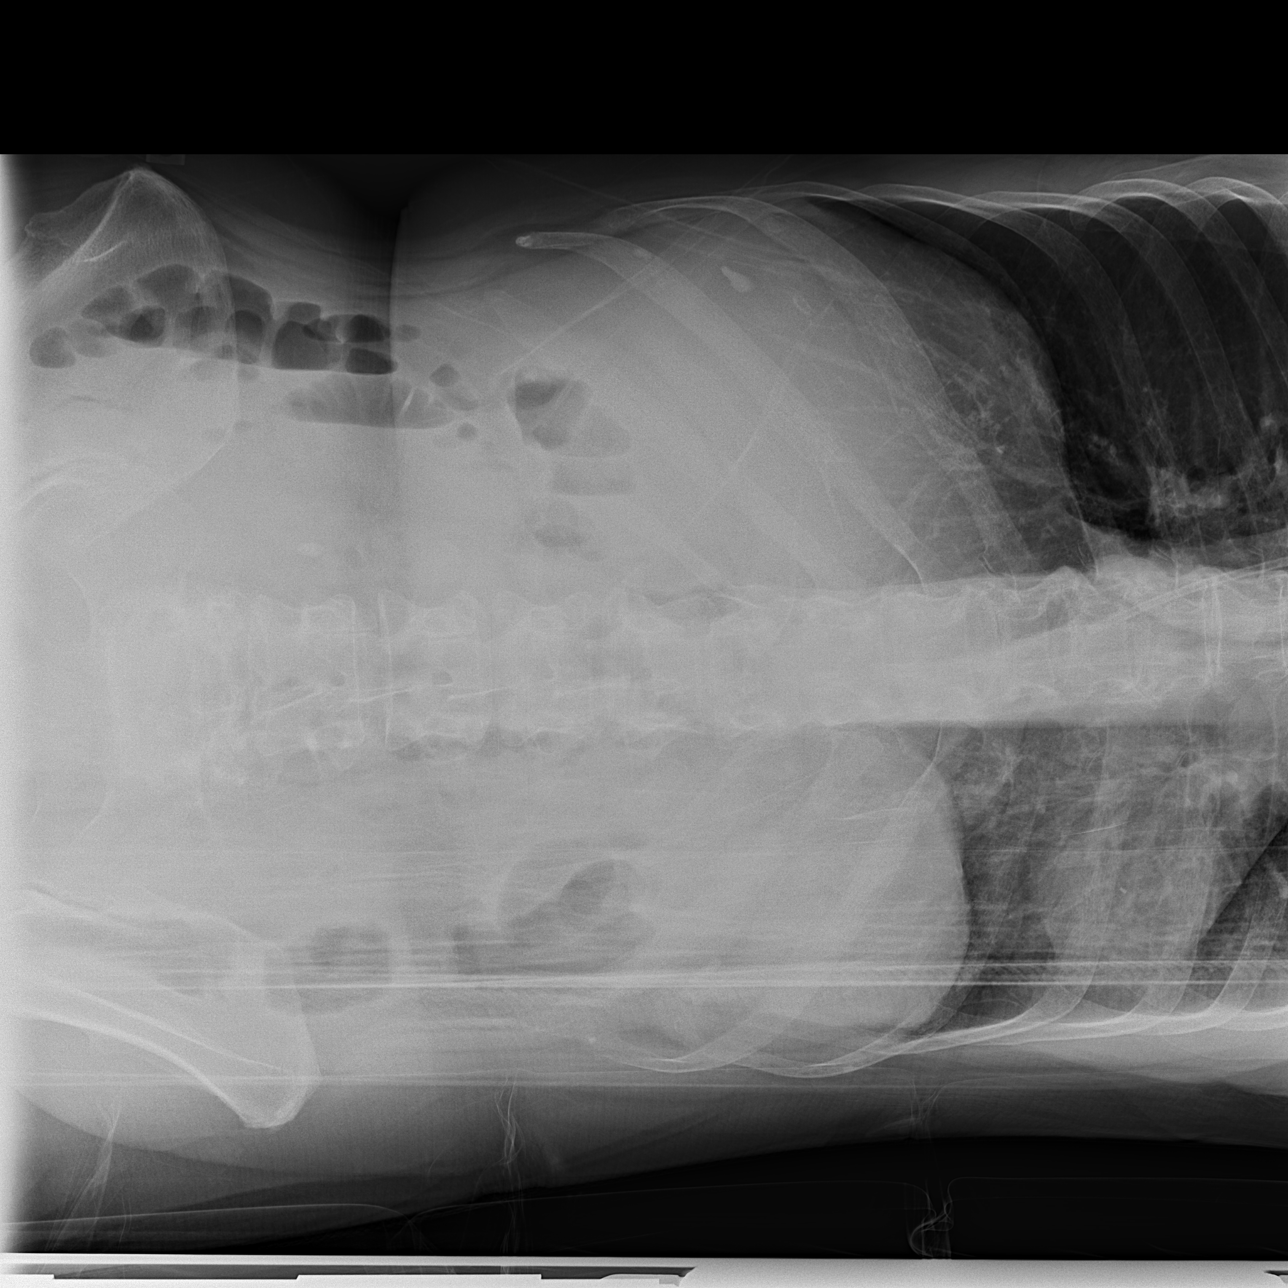

[x abdomen supine]
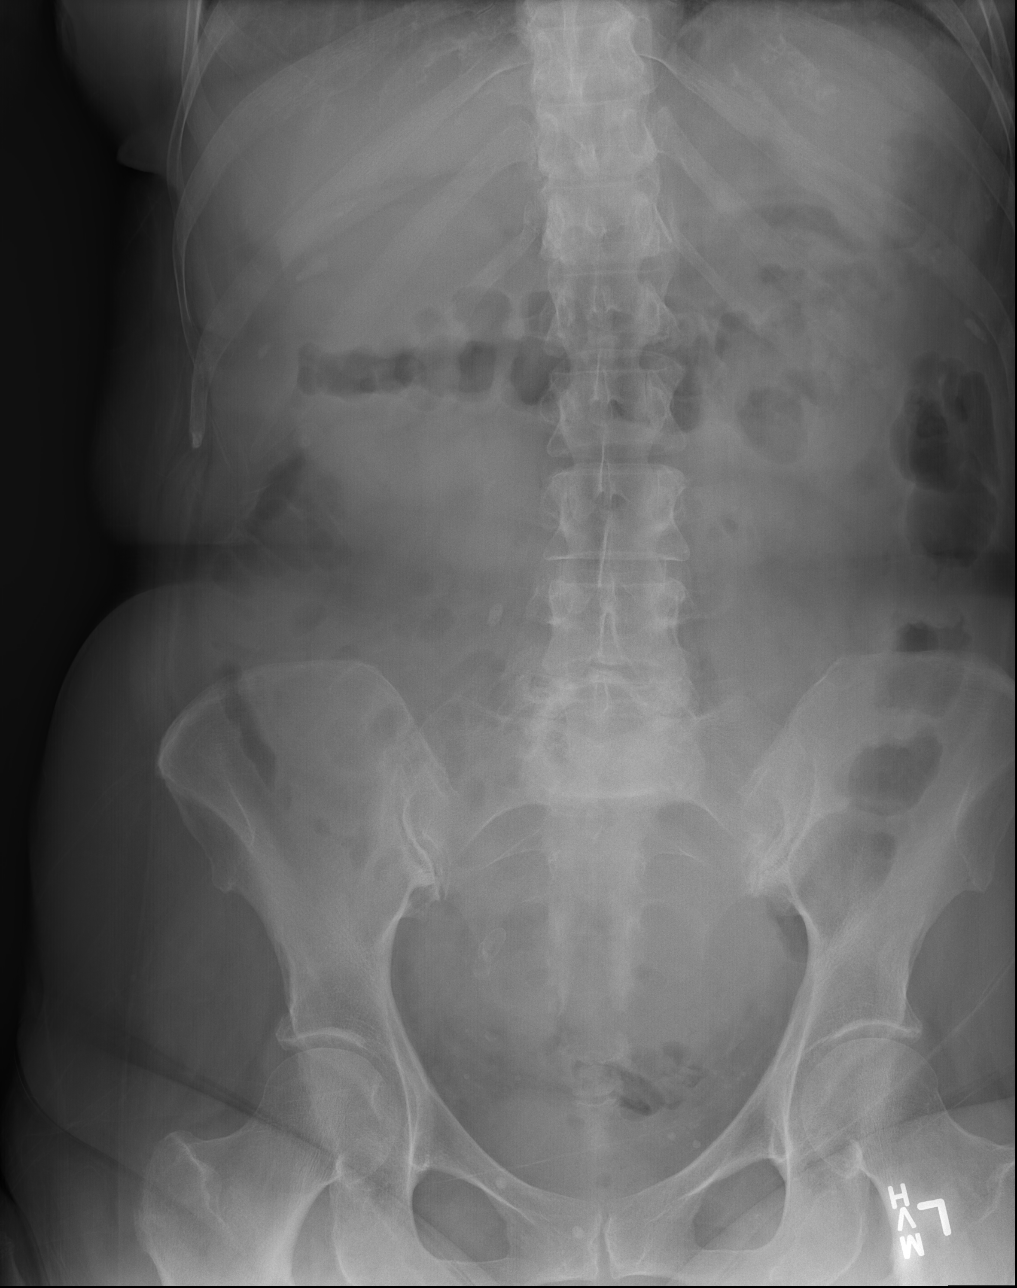

[x chest ap]
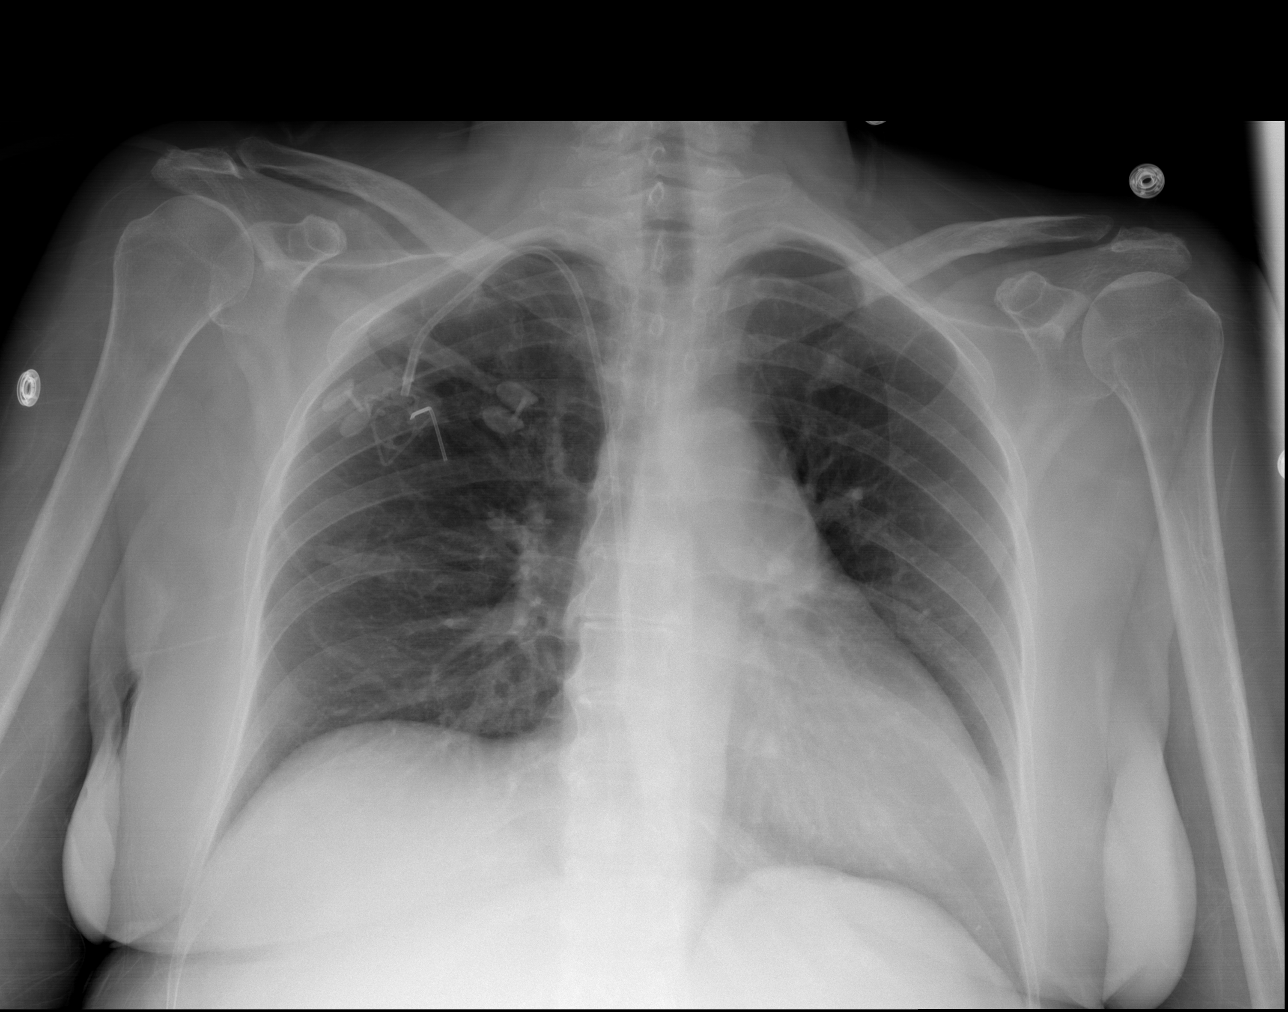

[3 of 3 positions shown; findings below may reference images not displayed]

FINDINGS: Chest radiograph demonstrates a right jugular Port-A-Cath. Catheter
tip is at the junction of SVC and right atrium. Mild elevation of
the right hemidiaphragm. Lungs are clear without airspace disease.
Heart size is normal. No evidence for free air.

Nonspecific bowel gas pattern with gas in the colon. Few air-fluid
levels in the abdomen. Stable calcification in the right lower
abdomen is probably related to a phlebolith.
IMPRESSION: No acute chest findings.

Nonspecific bowel gas pattern.

## 2015-04-10 IMAGING — CR DG CHEST 1V PORT
1 series · 1 of 1 positions shown · non-contrast
Comparison: DG ABD ACUTE W/CHEST dated 09/21/2013; IR FLUORO GUIDE
CV LINE*R* dated 06/14/2013

CLINICAL DATA: PICC line placement.

EXAM:
PORTABLE CHEST - 1 VIEW

[AP]
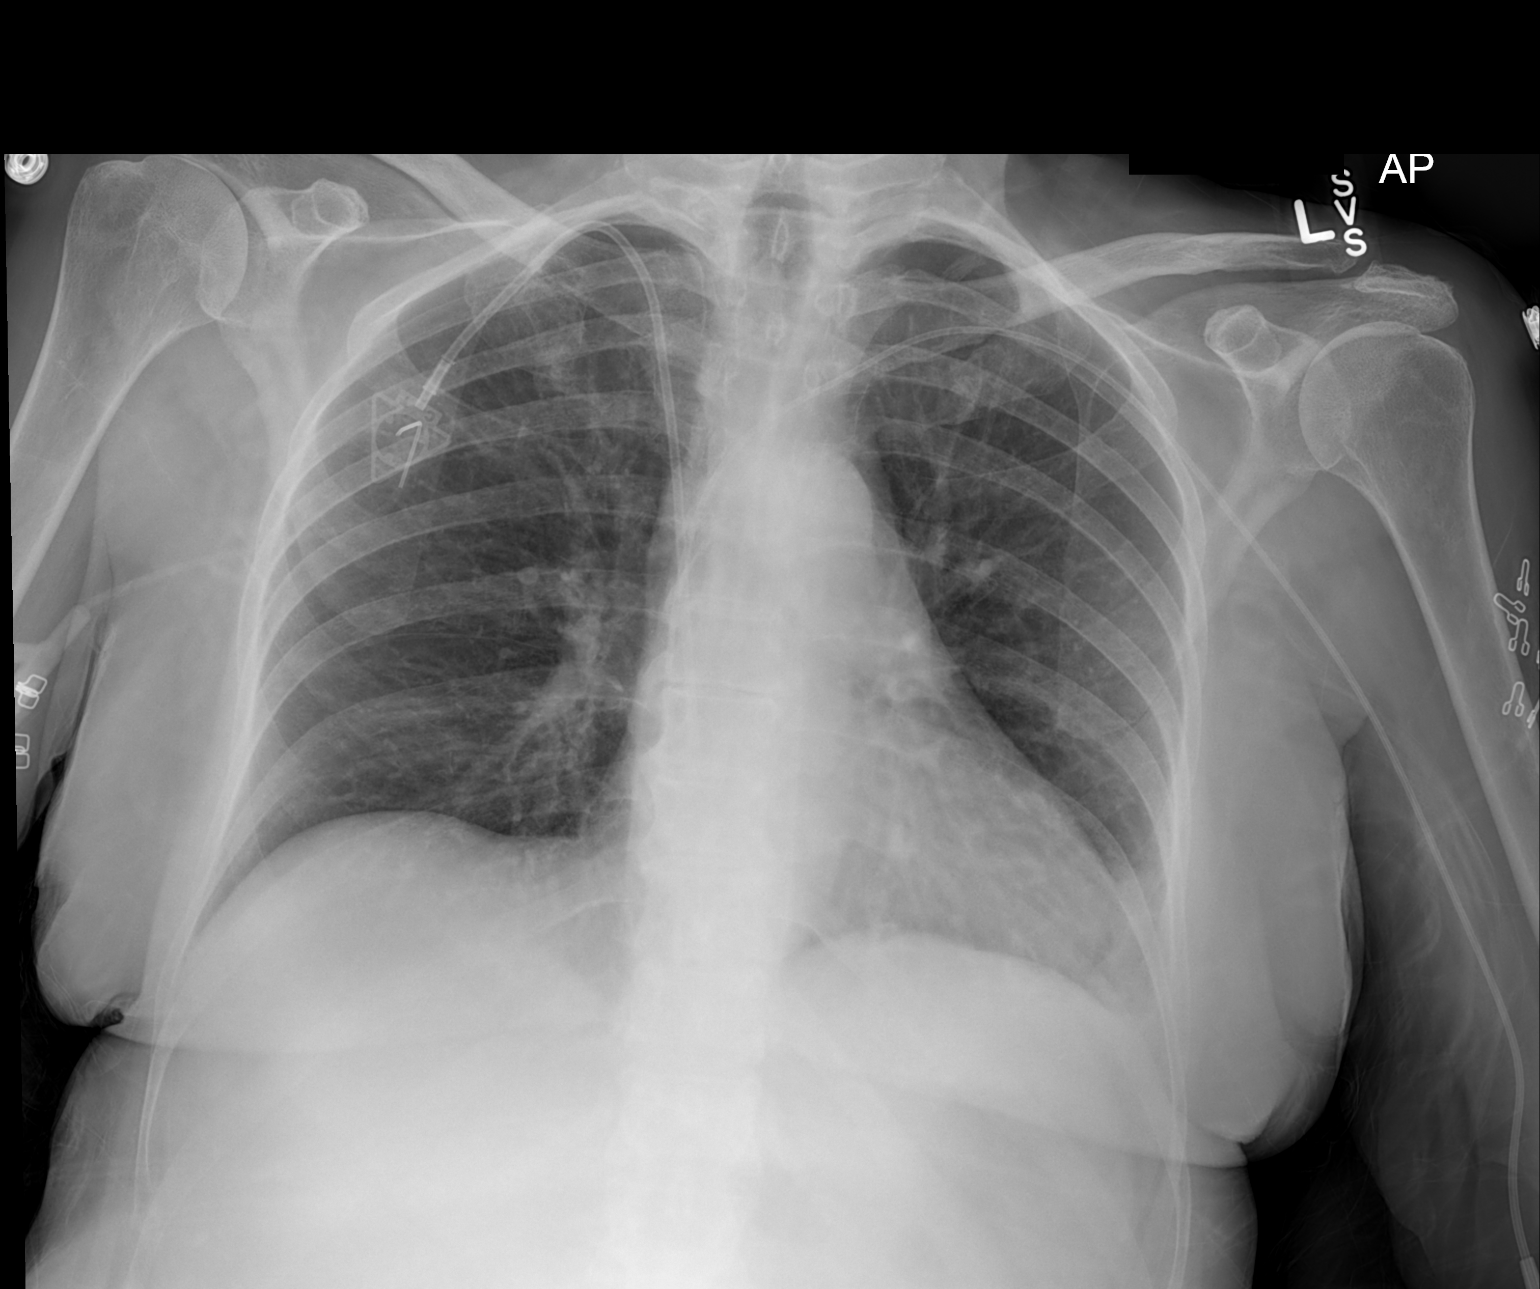

[1 of 1 positions shown; findings below may reference images not displayed]

FINDINGS: Left-sided PICC line has been placed with the tip in the lower SVC.
There is stable positioning of a right-sided Port-A-Cath. The lungs
are clear. The heart size is normal.
IMPRESSION: PICC line tip is in the SVC.
